# Patient Record
Sex: Female | Born: 1995 | Race: White | Hispanic: No | Marital: Single | Smoking: Former smoker
Health system: Southern US, Community
[De-identification: ages and names within clinical notes are randomized; demographics above are authoritative.]

## PROBLEM LIST (undated history)

## (undated) DIAGNOSIS — I499 Cardiac arrhythmia, unspecified: Secondary | ICD-10-CM

## (undated) DIAGNOSIS — H539 Unspecified visual disturbance: Secondary | ICD-10-CM

## (undated) DIAGNOSIS — N390 Urinary tract infection, site not specified: Secondary | ICD-10-CM

## (undated) DIAGNOSIS — B019 Varicella without complication: Secondary | ICD-10-CM

## (undated) DIAGNOSIS — F419 Anxiety disorder, unspecified: Secondary | ICD-10-CM

## (undated) DIAGNOSIS — F909 Attention-deficit hyperactivity disorder, unspecified type: Secondary | ICD-10-CM

## (undated) DIAGNOSIS — F50029 Anorexia nervosa, binge eating/purging type, unspecified: Secondary | ICD-10-CM

## (undated) DIAGNOSIS — E878 Other disorders of electrolyte and fluid balance, not elsewhere classified: Secondary | ICD-10-CM

## (undated) DIAGNOSIS — F5002 Anorexia nervosa, binge eating/purging type: Secondary | ICD-10-CM

## (undated) DIAGNOSIS — T39011A Poisoning by aspirin, accidental (unintentional), initial encounter: Secondary | ICD-10-CM

## (undated) DIAGNOSIS — F329 Major depressive disorder, single episode, unspecified: Secondary | ICD-10-CM

## (undated) DIAGNOSIS — F509 Eating disorder, unspecified: Secondary | ICD-10-CM

## (undated) DIAGNOSIS — F32A Depression, unspecified: Secondary | ICD-10-CM

## (undated) HISTORY — PX: INDUCED ABORTION: SHX677

## (undated) HISTORY — PX: GUM SURGERY: SHX658

---
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
## 2014-01-11 ENCOUNTER — Encounter: Payer: Self-pay | Admitting: Internal Medicine

## 2014-01-16 ENCOUNTER — Ambulatory Visit: Payer: Self-pay | Admitting: Internal Medicine

## 2014-04-02 ENCOUNTER — Encounter: Payer: Self-pay | Admitting: Internal Medicine

## 2014-04-15 ENCOUNTER — Ambulatory Visit (INDEPENDENT_AMBULATORY_CARE_PROVIDER_SITE_OTHER): Payer: BC Managed Care – PPO | Admitting: Obstetrics

## 2014-04-15 ENCOUNTER — Encounter: Payer: Self-pay | Admitting: Obstetrics

## 2014-04-15 VITALS — BP 110/69 | HR 49 | Temp 98.0°F | Ht 70.0 in | Wt 158.0 lb

## 2014-04-15 DIAGNOSIS — Z3041 Encounter for surveillance of contraceptive pills: Secondary | ICD-10-CM

## 2014-04-15 DIAGNOSIS — Z113 Encounter for screening for infections with a predominantly sexual mode of transmission: Secondary | ICD-10-CM

## 2014-04-15 DIAGNOSIS — Z01419 Encounter for gynecological examination (general) (routine) without abnormal findings: Secondary | ICD-10-CM

## 2014-04-15 DIAGNOSIS — IMO0002 Reserved for concepts with insufficient information to code with codable children: Secondary | ICD-10-CM | POA: Insufficient documentation

## 2014-04-15 NOTE — Progress Notes (Signed)
Subjective:     Danielle Prince is a 18 y.o. female here for a routine exam.  Current complaints:   Painful area at introitus anteriorly.  Small piece of tissue that hurts with intercourse for the past month.  Personal health questionnaire:  Is patient Ashkenazi Jewish, have a family history of breast and/or ovarian cancer: no Is there a family history of uterine cancer diagnosed at age < 6750, gastrointestinal cancer, urinary tract cancer, family member who is a Personnel officerLynch syndrome-associated carrier: no Is the patient overweight and hypertensive, family history of diabetes, personal history of gestational diabetes or PCOS: no Is patient over 2655, have PCOS,  family history of premature CHD under age 19, diabetes, smoke, have hypertension or peripheral artery disease:  no  The HPI was reviewed and explored in further detail by the provider. Gynecologic History Patient's last menstrual period was 04/07/2014. Contraception: OCP (estrogen/progesterone) Last Pap: n/a. Results were: n/a Last mammogram: n/a. Results were: n/a  Obstetric History OB History  Gravida Para Term Preterm AB SAB TAB Ectopic Multiple Living  0 0 0 0 0 0 0 0 0 0         Past Medical History  Diagnosis Date  . Anorexia nervosa with bulimia   . Depression   . Anxiety   . ADHD (attention deficit hyperactivity disorder)   . Eating disorder   . Urinary tract infection   . Varicella   . Vision abnormalities   . Electrolyte disturbance 2010, 2012    hypokalemia, hypochloremia.  . Aspirin overdose 02/2012  . ADHD (attention deficit hyperactivity disorder)   . Dysrhythmia     Past Surgical History  Procedure Laterality Date  . Gum surgery    . Colonoscopy with esophagogastroduodenoscopy (egd) N/A 10/28/2013    Procedure: COLONOSCOPY WITH ESOPHAGOGASTRODUODENOSCOPY (EGD);  Surgeon: Beverley FiedlerJay M Pyrtle, MD;  [facility];  Service: Endoscopy;  Laterality: N/A;    Current outpatient prescriptions:ferrous gluconate  (FERGON) 324 MG tablet, Take 1 tablet (324 mg total) by mouth 2 (two) times daily with a meal., Disp: 60 tablet, Rfl: 5;  Linaclotide (LINZESS) 290 MCG CAPS capsule, Take 1 capsule (290 mcg total) by mouth daily., Disp: 30 capsule, Rfl: 5;  Norgestimate-Ethinyl Estradiol Triphasic (TRINESSA, 28,) 0.18/0.215/0.25 MG-35 MCG tablet, Take 1 tablet by mouth at bedtime., Disp: , Rfl:  pantoprazole (PROTONIX) 40 MG tablet, Take 1 tablet (40 mg total) by mouth 2 (two) times daily before a meal., Disp: 60 tablet, Rfl: 5 No Known Allergies  History  Substance Use Topics  . Smoking status: Former Smoker -- 0.50 packs/day    Types: Cigarettes    Quit date: 06/25/2013  . Smokeless tobacco: Never Used  . Alcohol Use: Yes     Comment: occasioally     Family History  Problem Relation Age of Onset  . Depression Sister   . Depression Paternal Aunt   . Drug abuse Maternal Uncle     deceased  . Drug abuse Paternal Aunt       Review of Systems  Constitutional: negative for fatigue and weight loss Respiratory: negative for cough and wheezing Cardiovascular: negative for chest pain, fatigue and palpitations Gastrointestinal: negative for abdominal pain and change in bowel habits Musculoskeletal:negative for myalgias Neurological: negative for gait problems and tremors Behavioral/Psych: negative for abusive relationship, depression Endocrine: negative for temperature intolerance   Genitourinary:negative for abnormal menstrual periods, genital lesions, hot flashes, sexual problems and vaginal discharge Integument/breast: negative for breast lump, breast tenderness, nipple discharge and skin lesion(s)  Objective:       General:   alert  Skin:   no rash or abnormalities  Lungs:   clear to auscultation bilaterally  Heart:   regular rate and rhythm, S1, S2 normal, no murmur, click, rub or gallop  Breasts:   normal without suspicious masses, skin or nipple changes or axillary nodes  Abdomen:  normal  findings: no organomegaly, soft, non-tender and no hernia  Pelvis:  External genitalia: normal general appearance.  Tender suburethral hymenal remnant. Urinary system: urethral meatus normal and bladder without fullness, nontender Vaginal: normal without tenderness, induration or masses Cervix: normal appearance Adnexa: normal bimanual exam Uterus: anteverted and non-tender, normal size   Lab Review Urine pregnancy test Labs reviewed yes Radiologic studies reviewed no   Assessment:    Healthy female exam.  Not sure of etiology of the introital pain.  There is a small skin tag just below the urethra that is tender, but appears to be a normal hymenal remnant.    Plan:    Education reviewed: safe sex/STD prevention. Contraception: OCP (estrogen/progesterone). Follow up in: 6 weeks.   No orders of the defined types were placed in this encounter.   No orders of the defined types were placed in this encounter.   Need to obtain previous records Possible management options include:  Resection of the small tender vaginal mucosal tissue. Follow up as needed.

## 2014-04-16 LAB — WET PREP BY MOLECULAR PROBE
Candida species: NEGATIVE
GARDNERELLA VAGINALIS: POSITIVE — AB
TRICHOMONAS VAG: NEGATIVE

## 2014-04-16 LAB — GC/CHLAMYDIA PROBE AMP
CT Probe RNA: NEGATIVE
GC Probe RNA: NEGATIVE

## 2014-04-18 ENCOUNTER — Other Ambulatory Visit: Payer: Self-pay | Admitting: *Deleted

## 2014-04-18 DIAGNOSIS — B9689 Other specified bacterial agents as the cause of diseases classified elsewhere: Secondary | ICD-10-CM

## 2014-04-18 DIAGNOSIS — N760 Acute vaginitis: Principal | ICD-10-CM

## 2014-04-18 MED ORDER — METRONIDAZOLE 500 MG PO TABS: 500.0000 mg | ORAL_TABLET | Freq: Two times a day (BID) | ORAL | Status: DC

## 2014-05-30 ENCOUNTER — Ambulatory Visit: Payer: BC Managed Care – PPO | Admitting: Obstetrics

## 2014-06-13 ENCOUNTER — Encounter: Payer: Self-pay | Admitting: Obstetrics

## 2014-06-13 ENCOUNTER — Ambulatory Visit (INDEPENDENT_AMBULATORY_CARE_PROVIDER_SITE_OTHER): Payer: BC Managed Care – PPO | Admitting: Obstetrics

## 2014-06-13 VITALS — BP 104/69 | HR 62 | Temp 98.6°F | Ht 70.0 in | Wt 160.0 lb

## 2014-06-13 DIAGNOSIS — A499 Bacterial infection, unspecified: Secondary | ICD-10-CM

## 2014-06-13 DIAGNOSIS — N949 Unspecified condition associated with female genital organs and menstrual cycle: Secondary | ICD-10-CM

## 2014-06-13 DIAGNOSIS — B9689 Other specified bacterial agents as the cause of diseases classified elsewhere: Secondary | ICD-10-CM

## 2014-06-13 DIAGNOSIS — N760 Acute vaginitis: Secondary | ICD-10-CM

## 2014-06-13 MED ORDER — TINIDAZOLE 500 MG PO TABS: 1000.0000 mg | ORAL_TABLET | Freq: Every day | ORAL | Status: DC

## 2014-06-14 LAB — HEPATITIS B SURFACE ANTIGEN: Hepatitis B Surface Ag: NEGATIVE

## 2014-06-14 LAB — WET PREP BY MOLECULAR PROBE
CANDIDA SPECIES: NEGATIVE
Gardnerella vaginalis: POSITIVE — AB
TRICHOMONAS VAG: NEGATIVE

## 2014-06-14 LAB — RPR

## 2014-06-14 LAB — HEPATITIS C ANTIBODY: HCV Ab: NEGATIVE

## 2014-06-14 LAB — HIV ANTIBODY (ROUTINE TESTING W REFLEX): HIV 1&2 Ab, 4th Generation: NONREACTIVE

## 2014-06-14 LAB — GC/CHLAMYDIA PROBE AMP
CT Probe RNA: NEGATIVE
GC PROBE AMP APTIMA: NEGATIVE

## 2014-06-14 NOTE — Progress Notes (Signed)
Patient ID: Danielle ManilaMcKenzie Prince, female   DOB: Dec 03, 1996, 18 y.o.   MRN: [id number]  Chief Complaint  Patient presents with  . Follow-up    Pain/ STD Screen.     HPI Danielle Prince is a 18 y.o. female.  Pain with intercourse and malodorous vaginal discharge.  HPI  Past Medical History  Diagnosis Date  . Anorexia nervosa with bulimia   . Depression   . Anxiety   . ADHD (attention deficit hyperactivity disorder)   . Eating disorder   . Urinary tract infection   . Varicella   . Vision abnormalities   . Electrolyte disturbance 2010, 2012    hypokalemia, hypochloremia.  . Aspirin overdose 02/2012  . ADHD (attention deficit hyperactivity disorder)   . Dysrhythmia     Past Surgical History  Procedure Laterality Date  . Gum surgery    . Colonoscopy with esophagogastroduodenoscopy (egd) N/A 10/28/2013    Procedure: COLONOSCOPY WITH ESOPHAGOGASTRODUODENOSCOPY (EGD);  Surgeon: Beverley FiedlerJay M Pyrtle, MD;  Location[facility] END[facility];  Service: Endoscopy;  Laterality: N/A;    Family History  Problem Relation Age of Onset  . Depression Sister   . Depression Paternal Aunt   . Drug abuse Maternal Uncle     deceased  . Drug abuse Paternal Aunt     Social History History  Substance Use Topics  . Smoking status: Former Smoker -- 0.50 packs/day    Types: Cigarettes    Quit date: 06/25/2013  . Smokeless tobacco: Never Used  . Alcohol Use: Yes     Comment: occasioally     No Known Allergies  Current Outpatient Prescriptions  Medication Sig Dispense Refill  . ferrous gluconate (FERGON) 324 MG tablet Take 1 tablet (324 mg total) by mouth 2 (two) times daily with a meal.  60 tablet  5  . lamoTRIgine (LAMICTAL) 100 MG tablet Take 100 mg by mouth daily.      . Linaclotide (LINZESS) 290 MCG CAPS capsule Take 1 capsule (290 mcg total) by mouth daily.  30 capsule  5  . Lurasidone HCl (LATUDA) 20 MG TABS Take by mouth.      . Norgestimate-Ethinyl Estradiol Triphasic (TRINESSA, 28,) 0.18/0.215/0.25  MG-35 MCG tablet Take 1 tablet by mouth at bedtime.      . pantoprazole (PROTONIX) 40 MG tablet Take 1 tablet (40 mg total) by mouth 2 (two) times daily before a meal.  60 tablet  5  . tinidazole (TINDAMAX) 500 MG tablet Take 2 tablets (1,000 mg total) by mouth daily with breakfast.  10 tablet  2   No current facility-administered medications for this visit.    Review of Systems Review of Systems Constitutional: negative for fatigue and weight loss Respiratory: negative for cough and wheezing Cardiovascular: negative for chest pain, fatigue and palpitations Gastrointestinal: negative for abdominal pain and change in bowel habits Genitourinary:negative Integument/breast: negative for nipple discharge Musculoskeletal:negative for myalgias Neurological: negative for gait problems and tremors Behavioral/Psych: negative for abusive relationship, depression Endocrine: negative for temperature intolerance     Blood pressure 104/69, pulse 62, temperature 98.6 F (37 C), height 5\' 10"  (1.778 m), weight 160 lb (72.576 kg), last menstrual period 05/27/2014.  Physical Exam Physical Exam General:   alert  Skin:   no rash or abnormalities  Lungs:   clear to auscultation bilaterally  Heart:   regular rate and rhythm, S1, S2 normal, no murmur, click, rub or gallop  Breasts:   normal without suspicious masses, skin or nipple changes or axillary nodes  Abdomen:  normal findings: no organomegaly, soft, non-tender and no hernia  Pelvis:  External genitalia: normal general appearance Urinary system: urethral meatus normal and bladder without fullness, nontender Vaginal: normal without tenderness, induration or masses Cervix: normal appearance Adnexa: normal bimanual exam Uterus: anteverted and non-tender, normal size      Data Reviewed Labs  Assessment    Pelvic pain, particularly with intercourse. BV     Plan   Ibuprofen prn. Tindamax Rx.    Orders Placed This Encounter   Procedures  . WET PREP BY MOLECULAR PROBE  . GC/Chlamydia Probe Amp  . HIV antibody  . Hepatitis B surface antigen  . RPR  . Hepatitis C antibody   Meds ordered this encounter  Medications  . Lurasidone HCl (LATUDA) 20 MG TABS    Sig: Take by mouth.  . lamoTRIgine (LAMICTAL) 100 MG tablet    Sig: Take 100 mg by mouth daily.  Marland Kitchen. tinidazole (TINDAMAX) 500 MG tablet    Sig: Take 2 tablets (1,000 mg total) by mouth daily with breakfast.    Dispense:  10 tablet    Refill:  2      HARPER,CHARLES A 06/14/2014, 4:26 AM

## 2014-06-18 ENCOUNTER — Telehealth: Payer: Self-pay | Admitting: *Deleted

## 2014-06-18 NOTE — Telephone Encounter (Signed)
Patient called regarding lab results. Patient notified of lab results. Patient was treated at time of visit by Dr. Clearance CootsHarper. Patient notified that she is being treated appropriately.

## 2014-07-04 ENCOUNTER — Encounter: Payer: Self-pay | Admitting: Internal Medicine

## 2014-07-14 ENCOUNTER — Other Ambulatory Visit: Payer: Self-pay | Admitting: Obstetrics

## 2014-07-24 ENCOUNTER — Ambulatory Visit (INDEPENDENT_AMBULATORY_CARE_PROVIDER_SITE_OTHER): Payer: BC Managed Care – PPO | Admitting: Obstetrics

## 2014-07-24 ENCOUNTER — Encounter: Payer: Self-pay | Admitting: Obstetrics

## 2014-07-24 VITALS — BP 128/71 | HR 71 | Temp 98.2°F | Ht 70.0 in | Wt 162.0 lb

## 2014-07-24 DIAGNOSIS — A499 Bacterial infection, unspecified: Secondary | ICD-10-CM

## 2014-07-24 DIAGNOSIS — N760 Acute vaginitis: Secondary | ICD-10-CM

## 2014-07-24 DIAGNOSIS — B9689 Other specified bacterial agents as the cause of diseases classified elsewhere: Secondary | ICD-10-CM

## 2014-07-24 DIAGNOSIS — R21 Rash and other nonspecific skin eruption: Secondary | ICD-10-CM

## 2014-07-24 MED ORDER — METRONIDAZOLE 500 MG PO TABS: 500.0000 mg | ORAL_TABLET | Freq: Two times a day (BID) | ORAL | Status: DC

## 2014-07-24 MED ORDER — HYDROCORTISONE 1 % EX CREA: 1.0000 "application " | TOPICAL_CREAM | Freq: Two times a day (BID) | CUTANEOUS | Status: DC

## 2014-07-25 ENCOUNTER — Encounter: Payer: Self-pay | Admitting: Obstetrics

## 2014-07-25 ENCOUNTER — Encounter: Payer: Self-pay | Admitting: Internal Medicine

## 2014-07-25 NOTE — Progress Notes (Signed)
Patient ID: Danielle Prince, female   DOB: Jun 04, 1996, 18 y.o.   MRN: [id number]  Chief Complaint  Patient presents with  . Vaginal rash    HPI Danielle Prince is a 18 y.o. female.  C/O rash on breast and labia.  HPI  Past Medical History  Diagnosis Date  . Anorexia nervosa with bulimia   . Depression   . Anxiety   . ADHD (attention deficit hyperactivity disorder)   . Eating disorder   . Urinary tract infection   . Varicella   . Vision abnormalities   . Electrolyte disturbance 2010, 2012    hypokalemia, hypochloremia.  . Aspirin overdose 02/2012  . ADHD (attention deficit hyperactivity disorder)   . Dysrhythmia     Past Surgical History  Procedure Laterality Date  . Gum surgery    . Colonoscopy with esophagogastroduodenoscopy (egd) N/A 10/28/2013    Procedure: COLONOSCOPY WITH ESOPHAGOGASTRODUODENOSCOPY (EGD);  Surgeon: Beverley Fiedler, MD;  Location: [facility] ENDOSCOPY;  Service: Endoscopy;  Laterality: N/A;    Family History  Problem Relation Age of Onset  . Depression Sister   . Depression Paternal Aunt   . Drug abuse Maternal Uncle     deceased  . Drug abuse Paternal Aunt   . Cancer Father     Social History History  Substance Use Topics  . Smoking status: Former Smoker -- 0.50 packs/day    Types: Cigarettes    Quit date: 06/25/2013  . Smokeless tobacco: Never Used  . Alcohol Use: Yes     Comment: occasioally     No Known Allergies  Current Outpatient Prescriptions  Medication Sig Dispense Refill  . busPIRone (BUSPAR) 10 MG tablet Take 10 mg by mouth 3 (three) times daily.      . ferrous gluconate (FERGON) 324 MG tablet Take 1 tablet (324 mg total) by mouth 2 (two) times daily with a meal.  60 tablet  5  . lamoTRIgine (LAMICTAL) 100 MG tablet Take 100 mg by mouth daily.      . Linaclotide (LINZESS) 290 MCG CAPS capsule Take 1 capsule (290 mcg total) by mouth daily.  30 capsule  5  . Norgestimate-Ethinyl Estradiol Triphasic (TRINESSA, 28,) 0.18/0.215/0.25  MG-35 MCG tablet Take 1 tablet by mouth at bedtime.      . hydrocortisone cream 1 % Apply 1 application topically 2 (two) times daily.  30 g  0  . metroNIDAZOLE (FLAGYL) 500 MG tablet Take 1 tablet (500 mg total) by mouth 2 (two) times daily.  14 tablet  2   No current facility-administered medications for this visit.    Review of Systems Review of Systems Constitutional: negative for fatigue and weight loss Respiratory: negative for cough and wheezing Cardiovascular: negative for chest pain, fatigue and palpitations Gastrointestinal: negative for abdominal pain and change in bowel habits Genitourinary:negative Integument/breast: negative for nipple discharge Musculoskeletal:negative for myalgias Neurological: negative for gait problems and tremors Behavioral/Psych: negative for abusive relationship, depression Endocrine: negative for temperature intolerance     Blood pressure 128/71, pulse 71, temperature 98.2 F (36.8 C), height 5\' 10"  (1.778 m), weight 162 lb (73.483 kg), last menstrual period 07/15/2014.  Physical Exam Physical Exam General:   alert  Skin:   no rash or abnormalities  Lungs:   clear to auscultation bilaterally  Heart:   regular rate and rhythm, S1, S2 normal, no murmur, click, rub or gallop  Breasts:   normal without suspicious masses, skin or nipple changes or axillary nodes  Abdomen:  normal findings: no  organomegaly, soft, non-tender and no hernia  Pelvis:  External genitalia: normal general appearance Urinary system: urethral meatus normal and bladder without fullness, nontender Vaginal: normal without tenderness, induration or masses Cervix: normal appearance Adnexa: normal bimanual exam Uterus: anteverted and non-tender, normal size      Data Reviewed Labs  Assessment    Healthy female exam.  Patient reassured.    Plan    No orders of the defined types were placed in this encounter.   Meds ordered this encounter  Medications  . busPIRone  (BUSPAR) 10 MG tablet    Sig: Take 10 mg by mouth 3 (three) times daily.  . metroNIDAZOLE (FLAGYL) 500 MG tablet    Sig: Take 1 tablet (500 mg total) by mouth 2 (two) times daily.    Dispense:  14 tablet    Refill:  2  . hydrocortisone cream 1 %    Sig: Apply 1 application topically 2 (two) times daily.    Dispense:  30 g    Refill:  0      HARPER,CHARLES A 07/25/2014, 6:06 AM

## 2014-08-19 ENCOUNTER — Other Ambulatory Visit: Payer: Self-pay | Admitting: Obstetrics

## 2014-08-19 ENCOUNTER — Other Ambulatory Visit: Payer: Self-pay | Admitting: *Deleted

## 2014-08-19 DIAGNOSIS — Z3041 Encounter for surveillance of contraceptive pills: Secondary | ICD-10-CM

## 2014-08-19 MED ORDER — NORGESTIM-ETH ESTRAD TRIPHASIC 0.18/0.215/0.25 MG-35 MCG PO TABS: 1.0000 | ORAL_TABLET | Freq: Every day | ORAL | Status: DC

## 2014-09-04 ENCOUNTER — Ambulatory Visit: Payer: BC Managed Care – PPO | Admitting: Obstetrics

## 2014-10-01 ENCOUNTER — Encounter: Payer: Self-pay | Admitting: Internal Medicine

## 2014-10-01 ENCOUNTER — Telehealth: Payer: Self-pay | Admitting: *Deleted

## 2014-10-01 ENCOUNTER — Other Ambulatory Visit (INDEPENDENT_AMBULATORY_CARE_PROVIDER_SITE_OTHER): Payer: BC Managed Care – PPO

## 2014-10-01 ENCOUNTER — Ambulatory Visit (INDEPENDENT_AMBULATORY_CARE_PROVIDER_SITE_OTHER): Payer: BC Managed Care – PPO | Admitting: Internal Medicine

## 2014-10-01 VITALS — BP 108/60 | HR 72 | Ht 70.0 in | Wt 173.2 lb

## 2014-10-01 DIAGNOSIS — K209 Esophagitis, unspecified without bleeding: Secondary | ICD-10-CM

## 2014-10-01 DIAGNOSIS — D509 Iron deficiency anemia, unspecified: Secondary | ICD-10-CM

## 2014-10-01 DIAGNOSIS — K5900 Constipation, unspecified: Secondary | ICD-10-CM

## 2014-10-01 DIAGNOSIS — F502 Bulimia nervosa: Secondary | ICD-10-CM

## 2014-10-01 DIAGNOSIS — K648 Other hemorrhoids: Secondary | ICD-10-CM

## 2014-10-01 LAB — CBC WITH DIFFERENTIAL/PLATELET
BASOS ABS: 0.1 10*3/uL (ref 0.0–0.1)
BASOS PCT: 0.6 % (ref 0.0–3.0)
Eosinophils Absolute: 0.2 10*3/uL (ref 0.0–0.7)
Eosinophils Relative: 1.8 % (ref 0.0–5.0)
HEMATOCRIT: 40.5 % (ref 36.0–49.0)
HEMOGLOBIN: 13.1 g/dL (ref 12.0–16.0)
LYMPHS ABS: 1.9 10*3/uL (ref 0.7–4.0)
LYMPHS PCT: 20.1 % — AB (ref 24.0–48.0)
MCHC: 32.5 g/dL (ref 31.0–37.0)
MCV: 94.2 fl (ref 78.0–98.0)
MONOS PCT: 5.6 % (ref 3.0–12.0)
Monocytes Absolute: 0.5 10*3/uL (ref 0.1–1.0)
Neutro Abs: 6.8 10*3/uL (ref 1.4–7.7)
Neutrophils Relative %: 71.9 % — ABNORMAL HIGH (ref 43.0–71.0)
Platelets: 288 10*3/uL (ref 150.0–575.0)
RBC: 4.3 Mil/uL (ref 3.80–5.70)
RDW: 13.2 % (ref 11.4–15.5)
WBC: 9.5 10*3/uL (ref 4.5–13.5)

## 2014-10-01 LAB — IBC PANEL
Iron: 154 ug/dL — ABNORMAL HIGH (ref 42–145)
SATURATION RATIOS: 33.2 % (ref 20.0–50.0)
Transferrin: 331.1 mg/dL (ref 212.0–360.0)

## 2014-10-01 LAB — FERRITIN: FERRITIN: 16.8 ng/mL (ref 10.0–291.0)

## 2014-10-01 MED ORDER — HYDROCORTISONE ACETATE 25 MG RE SUPP: 25.0000 mg | Freq: Two times a day (BID) | RECTAL | Status: DC

## 2014-10-01 MED ORDER — LINACLOTIDE 145 MCG PO CAPS: 145.0000 ug | ORAL_CAPSULE | Freq: Every day | ORAL | Status: DC

## 2014-10-01 NOTE — Patient Instructions (Signed)
You have been scheduled for an endoscopy. Please follow written instructions given to you at your visit today. If you use inhalers (even only as needed), please bring them with you on the day of your procedure. Your physician has requested that you go to www.startemmi.com and enter the access code given to you at your visit today. This web site gives a general overview about your procedure. However, you should still follow specific instructions given to you by our office regarding your preparation for the procedure.  Go to the basement for labs today We have sent in your prescriptions to your pharmacy (Anusol Suppositories and Linzess) We will contact Dr Levie HeritageGuttermans office for a referral and contact you with that appointment

## 2014-10-01 NOTE — Addendum Note (Signed)
Addended by: Marlowe KaysSTALLINGS, Benett Swoyer M on: 10/01/2014 01:26 PM   Modules accepted: Orders

## 2014-10-01 NOTE — Progress Notes (Signed)
Patient ID: Danielle Prince, female   DOB: 06-02-1996, 18 y.o.   MRN: [id number] HPI: Danielle ManilaMcKenzie Favero is a 18 year old female known to me for evaluation hospital with iron deficiency anemia with rectal bleeding who is seen in followup. This is her first time seeing me in the office after seeing her in consultation in November 2014. At that time she had an upper endoscopy and colonoscopy performed to evaluate rectal bleeding and iron deficiency anemia. Upper endoscopy was notable for severe esophagitis felt secondary to reflux and also vomiting in the setting of her bulimia. Small bowel biopsies were unremarkable. Colonoscopic evaluation to the terminal ileum was normal, internal hemorrhoids were seen. Repeat upper endoscopy was recommended in 8-12 weeks but she failed to followup until today. Today her biggest complaint is constipation. This was previously an issue and she was using Linzess 290 mcg. When he is daily this gives her diarrhea and so she has been using it as needed and in actuality rarely. She notes constipation which is ongoing having about one hard and dry bowel movement weekly. She has noted intermittent red blood with wiping associated with bowel movement. She does report overall improvement in heartburn and denies dysphagia or odynophagia. She has prescription for pantoprazole but is not taking it regularly. She notes her bulimia overall is better and she is vomiting less. She reports having episodes where she will induce vomiting multiple times per day for 2-3 days and then not for weeks to months. Overall bulimia is better but she is not seeing anyone at mental health right now. She denies fevers or chills. She has noted some easy bruising. She is no longer taking iron supplementation.  Past Medical History  Diagnosis Date  . Anorexia nervosa with bulimia   . Depression   . Anxiety   . ADHD (attention deficit hyperactivity disorder)   . Eating disorder   . Urinary tract infection    . Varicella   . Vision abnormalities   . Electrolyte disturbance 2010, 2012    hypokalemia, hypochloremia.  . Aspirin overdose 02/2012  . ADHD (attention deficit hyperactivity disorder)   . Dysrhythmia     Past Surgical History  Procedure Laterality Date  . Gum surgery    . Colonoscopy with esophagogastroduodenoscopy (egd) N/A 10/28/2013    Procedure: COLONOSCOPY WITH ESOPHAGOGASTRODUODENOSCOPY (EGD);  Surgeon: Beverley FiedlerJay M Gianna Calef, MD;  [facility];  Service: Endoscopy;  Laterality: N/A;    Outpatient Prescriptions Prior to Visit  Medication Sig Dispense Refill  . lamoTRIgine (LAMICTAL) 100 MG tablet Take 100 mg by mouth daily.      . Norgestimate-Ethinyl Estradiol Triphasic (TRINESSA, 28,) 0.18/0.215/0.25 MG-35 MCG tablet Take 1 tablet by mouth at bedtime.  1 Package  11  . busPIRone (BUSPAR) 10 MG tablet Take 10 mg by mouth 3 (three) times daily.      . ferrous gluconate (FERGON) 324 MG tablet Take 1 tablet (324 mg total) by mouth 2 (two) times daily with a meal.  60 tablet  5  . hydrocortisone cream 1 % Apply 1 application topically 2 (two) times daily.  30 g  0  . Linaclotide (LINZESS) 290 MCG CAPS capsule Take 1 capsule (290 mcg total) by mouth daily.  30 capsule  5  . metroNIDAZOLE (FLAGYL) 500 MG tablet Take 1 tablet (500 mg total) by mouth 2 (two) times daily.  14 tablet  2  . TRINESSA, 28, 0.18/0.215/0.25 MG-35 MCG tablet TAKE 1 TABLET BY MOUTH EVERY DAY  28 tablet  0  No facility-administered medications prior to visit.    No Known Allergies  Family History  Problem Relation Age of Onset  . Depression Sister   . Depression Paternal Aunt   . Drug abuse Maternal Uncle     deceased  . Drug abuse Paternal Aunt   . Cancer Father     History  Substance Use Topics  . Smoking status: Former Smoker -- 0.50 packs/day    Types: Cigarettes    Quit date: 06/25/2013  . Smokeless tobacco: Never Used  . Alcohol Use: Yes     Comment: occasioally     ROS: As per  history of present illness, otherwise negative  BP 108/60  Pulse 72  Ht 5\' 10"  (1.778 m)  Wt 173 lb 3.2 oz (78.563 kg)  BMI 24.85 kg/m2  LMP 09/15/2014 Constitutional: Well-developed and well-nourished. No distress. HEENT: Normocephalic and atraumatic. Oropharynx is clear and moist. No oropharyngeal exudate. Conjunctivae are normal.  No scleral icterus. Neck: Neck supple. Trachea midline. Cardiovascular: Normal rate, regular rhythm and intact distal pulses. No M/R/G Pulmonary/chest: Effort normal and breath sounds normal. No wheezing, rales or rhonchi. Abdominal: Soft, nontender, nondistended. Bowel sounds active throughout. There are no masses palpable. No hepatosplenomegaly. Neurological: Alert and oriented to person place and time. Skin: Skin is warm and dry. No rashes noted. Psychiatric: Normal mood and affect. Behavior is normal.  RELEVANT LABS AND IMAGING: CBC    Component Value Date/Time   WBC 13.0* 10/28/2013 0544   RBC 4.64 10/28/2013 0544   HGB 8.8* 10/28/2013 0544   HCT 29.8* 10/28/2013 0544   PLT 507* 10/28/2013 0544   MCV 64.2* 10/28/2013 0544   MCH 19.0* 10/28/2013 0544   MCHC 29.5* 10/28/2013 0544   RDW 21.9* 10/28/2013 0544   LYMPHSABS 2.2 05/28/2012 1649   MONOABS 0.3 05/28/2012 1649   EOSABS 0.0 05/28/2012 1649   BASOSABS 0.0 05/28/2012 1649    CMP     Component Value Date/Time   NA 139 10/28/2013 0544   K 3.4* 10/28/2013 0544   CL 105 10/28/2013 0544   CO2 22 10/28/2013 0544   GLUCOSE 131* 10/28/2013 0544   BUN 10 10/28/2013 0544   CREATININE 0.71 10/28/2013 0544   CALCIUM 9.4 10/28/2013 0544   PROT 6.9 10/26/2013 1705   ALBUMIN 3.3* 10/26/2013 1705   AST 17 10/26/2013 1705   ALT 10 10/26/2013 1705   ALKPHOS 39 10/26/2013 1705   BILITOT 0.2* 10/26/2013 1705   GFRNONAA >90 10/28/2013 0544   GFRAA >90 10/28/2013 0544   Labs today to include CBC and iron studies  We reviewed the upper endoscopy and colonoscopy together  ASSESSMENT/PLAN: [age] year old female known to me for  evaluation hospital with iron deficiency anemia with rectal bleeding who is seen in followup.   1. Esophagitis -- secondary to bulimia and vomiting. No evidence for dysphagia. I have recommended repeat upper endoscopy to document healing and exclude underlying Barrett's esophagus. We discussed PPI, and I will feel more comfortable recommending at long-term based on the findings upcoming endoscopy. If heartburn is more of an issue daily, I instructed her to resume pantoprazole 40 mg daily.  2. Constipation -- she is having diarrhea with Linzess 290 mcg daily. I asked that she decrease to 145 mcg daily. I instructed her to use this on a daily basis to provide ultimate benefit. If this causes diarrhea my next option would be a trial of lower dose lubiprostone. Also encouraged her to drink 6-8 glasses of water or fluid daily.  3. Iron deficiency anemia -- celiac negative, likely some element of chronic blood loss with esophagitis and internal hemorrhoids. Repeat CBC and iron studies today  4. Internal hemorrhoids -- hydrocortisone suppositories 25 mg twice daily for 5 days, should improve more with improving constipation  5. Bulimia -- persistent though perhaps better subjectively. I encouraged her to establish with mental health provider and she will be referred to [facility] and she is agreeable and appreciative.

## 2014-10-01 NOTE — Telephone Encounter (Signed)
Patient scheduled to see Salomon Fickerri Bauert at  [facility] [place]  [address]  [place] [address]  10/07/2014 at [employer]1pm  Called patient and l/m

## 2014-10-07 ENCOUNTER — Ambulatory Visit: Payer: Self-pay | Admitting: Psychology

## 2014-10-09 ENCOUNTER — Ambulatory Visit (AMBULATORY_SURGERY_CENTER): Payer: BC Managed Care – PPO | Admitting: Internal Medicine

## 2014-10-09 ENCOUNTER — Encounter: Payer: Self-pay | Admitting: Internal Medicine

## 2014-10-09 VITALS — BP 106/70 | HR 55 | Temp 98.4°F | Resp 24 | Ht 70.0 in | Wt 173.0 lb

## 2014-10-09 DIAGNOSIS — K209 Esophagitis, unspecified without bleeding: Secondary | ICD-10-CM

## 2014-10-09 DIAGNOSIS — F502 Bulimia nervosa: Secondary | ICD-10-CM

## 2014-10-09 MED ORDER — SODIUM CHLORIDE 0.9 % IV SOLN: 500.0000 mL | INTRAVENOUS | Status: DC

## 2014-10-09 NOTE — Op Note (Addendum)
[facility] [address] [place], [id number]   ENDOSCOPY PROCEDURE REPORT  PATIENT: Galen ManilaDavidson, Danielle  MR#: [id number] BIRTHDATE: February 08, 1996 , 18  yrs. old GENDER: female ENDOSCOPIST: Beverley FiedlerJay M Talena Neira, MD PROCEDURE DATE:  10/09/2014 PROCEDURE:  EGD w/ biopsy ASA CLASS:     Class II INDICATIONS:  follow up of reflux esophagitis. MEDICATIONS: Propofol 200 mg IV, lidocaine 40 mg IV, and Monitored anesthesia care TOPICAL ANESTHETIC: none  DESCRIPTION OF PROCEDURE: After the risks benefits and alternatives of the procedure were thoroughly explained, informed consent was obtained.  The LB [id number] endoscope was introduced through the mouth and advanced to the second portion of the duodenum , Without limitations.  The instrument was slowly withdrawn as the mucosa was fully examined.   ESOPHAGUS: There was LA Class B esophagitis (One or more mucosal breaks > 5mm, but without continuity across mucosal folds) noted. This is felt secondary to acid reflux/vomiting.  The Z line above the top of the gastric folds was irregular, multiple biopsies to exclude Barrett's esophagus  STOMACH: A 3-4 cm hiatal hernia was noted.   The mucosa of the stomach appeared normal.  DUODENUM: The duodenal mucosa showed no abnormalities in the bulb and 2nd part of the duodenum.  Retroflexed views revealed a hiatal hernia.     The scope was then withdrawn from the patient and the procedure completed.  COMPLICATIONS: There were no immediate complications.  ENDOSCOPIC IMPRESSION: 1.   There was LA Class B esophagitis noted; irregular Z line; multiple biopsies 2.   3 cm hiatal hernia 3.   The mucosa of the stomach appeared normal 4.   The duodenal mucosa showed no abnormalities in the bulb and 2nd part of the duodenum  RECOMMENDATIONS: 1.  Increase pantoprazole to 40 mg twice daily for 8 weeks 2.  [facility] appointment as recommended 3.  Office follow-up with me in  8-12 weeks   eSigned:  Beverley FiedlerJay M Cheryl Stabenow, MD 10/09/2014 1:52 PM Revised: 10/09/2014 1:52 PMCC:The Patient; PCP

## 2014-10-09 NOTE — Progress Notes (Signed)
Pt stable to RR 

## 2014-10-09 NOTE — Progress Notes (Signed)
Called to room to assist during endoscopic procedure.  Patient ID and intended procedure confirmed with present staff. Received instructions for my participation in the procedure from the performing physician.  

## 2014-10-09 NOTE — Patient Instructions (Signed)
YOU HAD AN ENDOSCOPIC PROCEDURE TODAY AT THE [facility]: Refer to the procedure report that was given to you for any specific questions about what was found during the examination.  If the procedure report does not answer your questions, please call your gastroenterologist to clarify.  If you requested that your care partner not be given the details of your procedure findings, then the procedure report has been included in a sealed envelope for you to review at your convenience later.  YOU SHOULD EXPECT: Some feelings of bloating in the abdomen. Passage of more gas than usual.  Walking can help get rid of the air that was put into your GI tract during the procedure and reduce the bloating. If you had a lower endoscopy (such as a colonoscopy or flexible sigmoidoscopy) you may notice spotting of blood in your stool or on the toilet paper. If you underwent a bowel prep for your procedure, then you may not have a normal bowel movement for a few days.  DIET: Your first meal following the procedure should be a light meal and then it is ok to progress to your normal diet.  A half-sandwich or bowl of soup is an example of a good first meal.  Heavy or fried foods are harder to digest and may make you feel nauseous or bloated.  Likewise meals heavy in dairy and vegetables can cause extra gas to form and this can also increase the bloating.  Drink plenty of fluids but you should avoid alcoholic beverages for 24 hours.  ACTIVITY: Your care partner should take you home directly after the procedure.  You should plan to take it easy, moving slowly for the rest of the day.  You can resume normal activity the day after the procedure however you should NOT DRIVE or use heavy machinery for 24 hours (because of the sedation medicines used during the test).    SYMPTOMS TO REPORT IMMEDIATELY: A gastroenterologist can be reached at any hour.  During normal business hours, 8:30 AM to 5:00 PM Monday through Friday,  call [phone].  After hours and on weekends, please call the GI answering service at [phone] who will take a message and have the physician on call contact you.   Following upper endoscopy (EGD)  Vomiting of blood or coffee ground material  New chest pain or pain under the shoulder blades  Painful or persistently difficult swallowing  New shortness of breath  Fever of 100F or higher  Black, tarry-looking stools  FOLLOW UP: If any biopsies were taken you will be contacted by phone or by letter within the next 1-3 weeks.  Call your gastroenterologist if you have not heard about the biopsies in 3 weeks.  Our staff will call the home number listed on your records the next business day following your procedure to check on you and address any questions or concerns that you may have at that time regarding the information given to you following your procedure. This is a courtesy call and so if there is no answer at the home number and we have not heard from you through the emergency physician on call, we will assume that you have returned to your regular daily activities without incident.  SIGNATURES/CONFIDENTIALITY: You and/or your care partner have signed paperwork which will be entered into your electronic medical record.  These signatures attest to the fact that that the information  above on your After Visit Summary has been reviewed and is understood.  Full  responsibility of the confidentiality of this discharge information lies with you and/or your care-partner.  Esophagitis and hiatal hernia information given.  Increase pantoprazole t0 40mg  twice daily for 8 weeks.  Please call to reschedule appointment with [facility] at [phone].  Follow up with Dr. Rhea BeltonPyrtle in 8-12 weeks, call to make appointment.

## 2014-10-10 ENCOUNTER — Telehealth: Payer: Self-pay

## 2014-10-10 NOTE — Telephone Encounter (Signed)
Left a message at [phone] for the pt to call if any questions or concerns. maw

## 2014-10-16 ENCOUNTER — Encounter: Payer: Self-pay | Admitting: Internal Medicine

## 2014-10-21 ENCOUNTER — Telehealth: Payer: Self-pay | Admitting: Internal Medicine

## 2014-10-21 NOTE — Telephone Encounter (Signed)
We recently increased the dose of Linzess due to nonresponse to the lower dose Diarrhea as a result of this medication should stop after a few days. Discontinue altogether If constipation returns, try MiraLAX 17 g daily If diarrhea still present by Thursday of this week she should notify us

## 2014-10-21 NOTE — Telephone Encounter (Signed)
Attempted to call pt back and was unable to reach pt or leave her a message.

## 2014-10-21 NOTE — Telephone Encounter (Signed)
Pt states the linzess she has been taking is not working for her, states it is causing her to have diarrhea. Pt states she was taking a higher dose and is now taking the lower dose but still having diarrhea. Please advise.

## 2014-10-22 NOTE — Telephone Encounter (Signed)
Pt aware. States she may try taking the linzess every other day instead of every day.

## 2014-12-10 ENCOUNTER — Ambulatory Visit: Payer: BC Managed Care – PPO | Admitting: Internal Medicine

## 2015-01-16 ENCOUNTER — Telehealth: Payer: Self-pay | Admitting: *Deleted

## 2015-01-16 NOTE — Telephone Encounter (Signed)
Patient called requesting an appointment. 5:42 Patient states she is having UTI symptoms. Patient found lump in vagina- wants it checked. Appointment made.

## 2015-01-17 ENCOUNTER — Encounter: Payer: Self-pay | Admitting: Obstetrics

## 2015-01-17 ENCOUNTER — Ambulatory Visit (INDEPENDENT_AMBULATORY_CARE_PROVIDER_SITE_OTHER): Payer: Self-pay | Admitting: Obstetrics

## 2015-01-17 VITALS — BP 116/71 | HR 73 | Temp 98.1°F | Wt 172.0 lb

## 2015-01-17 DIAGNOSIS — R399 Unspecified symptoms and signs involving the genitourinary system: Secondary | ICD-10-CM

## 2015-01-17 DIAGNOSIS — N898 Other specified noninflammatory disorders of vagina: Secondary | ICD-10-CM

## 2015-01-17 LAB — POCT URINALYSIS DIPSTICK
BILIRUBIN UA: NEGATIVE
GLUCOSE UA: NEGATIVE
KETONES UA: NEGATIVE
Leukocytes, UA: NEGATIVE
NITRITE UA: NEGATIVE
Protein, UA: NEGATIVE
Spec Grav, UA: 1.01
Urobilinogen, UA: NEGATIVE
pH, UA: 6.5

## 2015-01-17 MED ORDER — FLUCONAZOLE 150 MG PO TABS: 150.0000 mg | ORAL_TABLET | Freq: Once | ORAL | Status: DC

## 2015-01-17 NOTE — Progress Notes (Signed)
Patient ID: Danielle Prince, female   DOB: [birth year], 19 y.o.   MRN: [id number]  No chief complaint on file.   HPI Danielle Prince is a 19 y.o. female.  Thinks that she's got a yeast infection from treatment of UTI.  Vaginal discharge and itching. HPI  Past Medical History  Diagnosis Date  . Anorexia nervosa with bulimia   . Depression   . Anxiety   . ADHD (attention deficit hyperactivity disorder)   . Eating disorder   . Urinary tract infection   . Varicella   . Vision abnormalities   . Electrolyte disturbance 2010, 2012    hypokalemia, hypochloremia.  . Aspirin overdose 02/2012  . ADHD (attention deficit hyperactivity disorder)   . Dysrhythmia     Past Surgical History  Procedure Laterality Date  . Gum surgery    . Colonoscopy with esophagogastroduodenoscopy (egd) N/A 10/28/2013    Procedure: COLONOSCOPY WITH ESOPHAGOGASTRODUODENOSCOPY (EGD);  Surgeon: Beverley Fiedler, MD;  Location: [facility] ENDOSCOPY;  Service: Endoscopy;  Laterality: N/A;    Family History  Problem Relation Age of Onset  . Depression Sister   . Depression Paternal Aunt   . Drug abuse Maternal Uncle     deceased  . Drug abuse Paternal Aunt   . Cancer Father   . Colon cancer Maternal Grandmother     Social History History  Substance Use Topics  . Smoking status: Former Smoker -- 0.50 packs/day    Types: Cigarettes    Quit date: 06/25/2013  . Smokeless tobacco: Never Used  . Alcohol Use: Yes     Comment: occasioally     No Known Allergies  Current Outpatient Prescriptions  Medication Sig Dispense Refill  . lamoTRIgine (LAMICTAL) 100 MG tablet Take 100 mg by mouth daily.    . Linaclotide (LINZESS) 145 MCG CAPS capsule Take 1 capsule (145 mcg total) by mouth daily. 30 capsule 2  . Norgestimate-Ethinyl Estradiol Triphasic (TRINESSA, 28,) 0.18/0.215/0.25 MG-35 MCG tablet Take 1 tablet by mouth at bedtime. 1 Package 11  . pantoprazole (PROTONIX) 40 MG tablet     . fluconazole (DIFLUCAN) 150 MG  tablet Take 1 tablet (150 mg total) by mouth once. 1 tablet 2  . hydrocortisone (ANUCORT-HC) 25 MG suppository Place 1 suppository (25 mg total) rectally 2 (two) times daily. 10 suppository 0   No current facility-administered medications for this visit.    Review of Systems Review of Systems Constitutional: negative for fatigue and weight loss Respiratory: negative for cough and wheezing Cardiovascular: negative for chest pain, fatigue and palpitations Gastrointestinal: negative for abdominal pain and change in bowel habits Genitourinary:negative Integument/breast: negative for nipple discharge Musculoskeletal:negative for myalgias Neurological: negative for gait problems and tremors Behavioral/Psych: negative for abusive relationship, depression Endocrine: negative for temperature intolerance     Blood pressure 116/71, pulse 73, temperature 98.1 F (36.7 C), weight 172 lb (78.019 kg), last menstrual period 01/03/2015.  Physical Exam Physical Exam General:   alert  Skin:   no rash or abnormalities  Lungs:   clear to auscultation bilaterally  Heart:   regular rate and rhythm, S1, S2 normal, no murmur, click, rub or gallop  Breasts:   normal without suspicious masses, skin or nipple changes or axillary nodes  Abdomen:  normal findings: no organomegaly, soft, non-tender and no hernia  Pelvis:  External genitalia: normal general appearance Urinary system: urethral meatus normal and bladder without fullness, nontender Vaginal: normal without tenderness, induration or masses Cervix: normal appearance Adnexa: normal bimanual exam Uterus: anteverted and  non-tender, normal size      Data Reviewed Labs   Assessment    Candida vulvovaginitis.    Plan  Diflucan Rx   Orders Placed This Encounter  Procedures  . Urine culture  . SureSwab, Vaginosis/Vaginitis Plus  . POCT urinalysis dipstick   Meds ordered this encounter  Medications  . fluconazole (DIFLUCAN) 150 MG tablet     Sig: Take 1 tablet (150 mg total) by mouth once.    Dispense:  1 tablet    Refill:  2

## 2015-01-19 LAB — URINE CULTURE: Colony Count: >=100000

## 2015-01-20 ENCOUNTER — Other Ambulatory Visit: Payer: Self-pay | Admitting: Obstetrics

## 2015-01-21 ENCOUNTER — Other Ambulatory Visit: Payer: Self-pay | Admitting: Obstetrics

## 2015-01-21 DIAGNOSIS — N390 Urinary tract infection, site not specified: Secondary | ICD-10-CM

## 2015-01-21 MED ORDER — AMOXICILLIN 500 MG PO CAPS: 500.0000 mg | ORAL_CAPSULE | Freq: Three times a day (TID) | ORAL | Status: DC

## 2015-01-22 ENCOUNTER — Other Ambulatory Visit: Payer: Self-pay | Admitting: Obstetrics

## 2015-01-22 ENCOUNTER — Other Ambulatory Visit: Payer: Self-pay | Admitting: *Deleted

## 2015-01-22 DIAGNOSIS — N390 Urinary tract infection, site not specified: Secondary | ICD-10-CM

## 2015-01-22 LAB — SURESWAB, VAGINOSIS/VAGINITIS PLUS
Atopobium vaginae: NOT DETECTED Log (cells/mL)
C. albicans, DNA: DETECTED — AB
C. glabrata, DNA: NOT DETECTED
C. parapsilosis, DNA: DETECTED — AB
C. trachomatis RNA, TMA: NOT DETECTED
C. tropicalis, DNA: NOT DETECTED
GARDNERELLA VAGINALIS: 5.1 Log (cells/mL)
LACTOBACILLUS SPECIES: NOT DETECTED Log (cells/mL)
MEGASPHAERA SPECIES: NOT DETECTED Log (cells/mL)
N. GONORRHOEAE RNA, TMA: NOT DETECTED
T. vaginalis RNA, QL TMA: NOT DETECTED

## 2015-01-22 MED ORDER — AMOXICILLIN 500 MG PO CAPS: 500.0000 mg | ORAL_CAPSULE | Freq: Three times a day (TID) | ORAL | Status: DC

## 2015-02-03 ENCOUNTER — Other Ambulatory Visit: Payer: Self-pay | Admitting: *Deleted

## 2015-02-03 MED ORDER — LINACLOTIDE 145 MCG PO CAPS: 145.0000 ug | ORAL_CAPSULE | Freq: Every day | ORAL | Status: DC

## 2015-04-11 ENCOUNTER — Other Ambulatory Visit: Payer: Self-pay | Admitting: *Deleted

## 2015-04-11 ENCOUNTER — Other Ambulatory Visit: Payer: Self-pay | Admitting: Internal Medicine

## 2015-04-11 ENCOUNTER — Telehealth: Payer: Self-pay | Admitting: Internal Medicine

## 2015-04-11 MED ORDER — LUBIPROSTONE 24 MCG PO CAPS: 24.0000 ug | ORAL_CAPSULE | Freq: Two times a day (BID) | ORAL | Status: DC

## 2015-04-11 NOTE — Telephone Encounter (Signed)
Pt states linzess is not helping her anymore. Pt states she has tried the higher dose and would like to see if there is something else she could try. Please advise.

## 2015-04-11 NOTE — Telephone Encounter (Signed)
Spoke with patient and gave her recommendations. Rx sent to pharmacy. 

## 2015-04-11 NOTE — Telephone Encounter (Signed)
Left a message for patient to call back. 

## 2015-04-11 NOTE — Telephone Encounter (Signed)
Encourage hydration and can try lubiprostone 24 mcg BID

## 2015-04-11 NOTE — Telephone Encounter (Signed)
Pt states that linzess is not helping her .

## 2015-07-16 ENCOUNTER — Encounter: Payer: Self-pay | Admitting: Obstetrics

## 2015-07-16 ENCOUNTER — Ambulatory Visit (INDEPENDENT_AMBULATORY_CARE_PROVIDER_SITE_OTHER): Payer: BLUE CROSS/BLUE SHIELD | Admitting: Obstetrics

## 2015-07-16 VITALS — BP 121/79 | HR 77 | Temp 98.0°F | Ht 69.5 in | Wt 169.0 lb

## 2015-07-16 DIAGNOSIS — N9489 Other specified conditions associated with female genital organs and menstrual cycle: Secondary | ICD-10-CM | POA: Diagnosis not present

## 2015-07-16 DIAGNOSIS — Z3041 Encounter for surveillance of contraceptive pills: Secondary | ICD-10-CM | POA: Diagnosis not present

## 2015-07-16 DIAGNOSIS — B373 Candidiasis of vulva and vagina: Secondary | ICD-10-CM | POA: Diagnosis not present

## 2015-07-16 DIAGNOSIS — N760 Acute vaginitis: Secondary | ICD-10-CM

## 2015-07-16 DIAGNOSIS — Z113 Encounter for screening for infections with a predominantly sexual mode of transmission: Secondary | ICD-10-CM

## 2015-07-16 DIAGNOSIS — N898 Other specified noninflammatory disorders of vagina: Secondary | ICD-10-CM

## 2015-07-16 DIAGNOSIS — B9689 Other specified bacterial agents as the cause of diseases classified elsewhere: Secondary | ICD-10-CM

## 2015-07-16 DIAGNOSIS — A499 Bacterial infection, unspecified: Secondary | ICD-10-CM

## 2015-07-16 DIAGNOSIS — B3731 Acute candidiasis of vulva and vagina: Secondary | ICD-10-CM

## 2015-07-16 DIAGNOSIS — Z01419 Encounter for gynecological examination (general) (routine) without abnormal findings: Secondary | ICD-10-CM

## 2015-07-16 MED ORDER — TINIDAZOLE 500 MG PO TABS: 1000.0000 mg | ORAL_TABLET | Freq: Every day | ORAL | Status: DC

## 2015-07-16 MED ORDER — FLUCONAZOLE 150 MG PO TABS: 150.0000 mg | ORAL_TABLET | Freq: Once | ORAL | Status: DC

## 2015-07-16 MED ORDER — NORGESTIM-ETH ESTRAD TRIPHASIC 0.18/0.215/0.25 MG-35 MCG PO TABS: 1.0000 | ORAL_TABLET | Freq: Every day | ORAL | Status: DC

## 2015-07-16 NOTE — Progress Notes (Addendum)
Subjective:        Danielle Prince is a 19 y.o. female here for a routine exam.  Current complaints: Malodorous vaginal discharge.    Personal health questionnaire:  Is patient Ashkenazi Jewish, have a family history of breast and/or ovarian cancer: no Is there a family history of uterine cancer diagnosed at age < 63, gastrointestinal cancer, urinary tract cancer, family member who is a Personnel officer syndrome-associated carrier: no Is the patient overweight and hypertensive, family history of diabetes, personal history of gestational diabetes, preeclampsia or PCOS: no Is patient over 35, have PCOS,  family history of premature CHD under age 33, diabetes, smoke, have hypertension or peripheral artery disease:  no At any time, has a partner hit, kicked or otherwise hurt or frightened you?: no Over the past 2 weeks, have you felt down, depressed or hopeless?: no Over the past 2 weeks, have you felt little interest or pleasure in doing things?:no   Gynecologic History Patient's last menstrual period was 06/20/2015. Contraception: OCP (estrogen/progesterone) Last Pap: n/a. Results were: n/a Last mammogram: n/a. Results were: n/a  Obstetric History OB History  Gravida Para Term Preterm AB SAB TAB Ectopic Multiple Living         Past Medical History  Diagnosis Date  . Anorexia nervosa with bulimia   . Depression   . Anxiety   . ADHD (attention deficit hyperactivity disorder)   . Eating disorder   . Urinary tract infection   . Varicella   . Vision abnormalities   . Electrolyte disturbance 2010, 2012    hypokalemia, hypochloremia.  . Aspirin overdose 02/2012  . ADHD (attention deficit hyperactivity disorder)   . Dysrhythmia     Past Surgical History  Procedure Laterality Date  . Gum surgery    . Colonoscopy with esophagogastroduodenoscopy (egd) N/A 10/28/2013    Procedure: COLONOSCOPY WITH ESOPHAGOGASTRODUODENOSCOPY (EGD);  Surgeon: Beverley Fiedler, MD;   Location: [facility] ENDOSCOPY;  Service: Endoscopy;  Laterality: N/A;     Current outpatient prescriptions:  .  Norgestimate-Ethinyl Estradiol Triphasic (TRINESSA, 28,) 0.18/0.215/0.25 MG-35 MCG tablet, Take 1 tablet by mouth at bedtime., Disp: 1 Package, Rfl: 11 .  fluconazole (DIFLUCAN) 150 MG tablet, Take 1 tablet (150 mg total) by mouth once., Disp: 1 tablet, Rfl: 2 .  lamoTRIgine (LAMICTAL) 100 MG tablet, Take 100 mg by mouth daily., Disp: , Rfl:  .  tinidazole (TINDAMAX) 500 MG tablet, Take 2 tablets (1,000 mg total) by mouth daily with breakfast., Disp: 10 tablet, Rfl: 2 No Known Allergies  History  Substance Use Topics  . Smoking status: Former Smoker -- 0.50 packs/day    Types: Cigarettes    Quit date: 06/25/2013  . Smokeless tobacco: Never Used  . Alcohol Use: 0.0 oz/week    0 Standard drinks or equivalent per week     Comment: occasioally     Family History  Problem Relation Age of Onset  . Depression Sister   . Depression Paternal Aunt   . Drug abuse Maternal Uncle     deceased  . Drug abuse Paternal Aunt   . Cancer Father   . Colon cancer Maternal Grandmother       Review of Systems  Constitutional: negative for fatigue and weight loss Respiratory: negative for cough and wheezing Cardiovascular: negative for chest pain, fatigue and palpitations Gastrointestinal: negative for abdominal pain and change in bowel habits Musculoskeletal:negative for myalgias Neurological: negative for gait problems and tremors Behavioral/Psych:  positive for depression and ADHD Endocrine: negative for temperature intolerance   Genitourinary:negative for abnormal menstrual periods, genital lesions, hot flashes, sexual problems.  Positive for vaginal discharge with fishy odor Integument/breast: negative for breast lump, breast tenderness, nipple discharge and skin lesion(s)    Objective:       BP 121/79 mmHg  Pulse 77  Temp(Src) 98 F (36.7 C)  Ht 5' 9.5" (1.765 m)  Wt 169 lb  (76.658 kg)  BMI 24.61 kg/m2  LMP 06/20/2015 General:   alert  Skin:   no rash or abnormalities  Lungs:   clear to auscultation bilaterally  Heart:   regular rate and rhythm, S1, S2 normal, no murmur, click, rub or gallop  Breasts:   normal without suspicious masses, skin or nipple changes or axillary nodes  Abdomen:  normal findings: no organomegaly, soft, non-tender and no hernia  Pelvis:  External genitalia: normal general appearance Urinary system: urethral meatus normal and bladder without fullness, nontender Vaginal: normal without tenderness, induration or masses Cervix: normal appearance Adnexa: normal bimanual exam Uterus: anteverted and non-tender, normal size   Lab Review Urine pregnancy test Labs reviewed yes Radiologic studies reviewed no    Assessment:    Healthy female exam.    BV  Contraceptive management.  Pleased with OCP's.  Depression.  Stable  ADHD.  Stable   Plan:   Tindamax Rx   Education reviewed: depression evaluation, low fat, low cholesterol diet, safe sex/STD prevention and weight bearing exercise. Contraception: OCP (estrogen/progesterone). Follow up in: 1 year.   Meds ordered this encounter  Medications  . tinidazole (TINDAMAX) 500 MG tablet    Sig: Take 2 tablets (1,000 mg total) by mouth daily with breakfast.    Dispense:  10 tablet    Refill:  2  . Norgestimate-Ethinyl Estradiol Triphasic (TRINESSA, 28,) 0.18/0.215/0.25 MG-35 MCG tablet    Sig: Take 1 tablet by mouth at bedtime.    Dispense:  1 Package    Refill:  11  . fluconazole (DIFLUCAN) 150 MG tablet    Sig: Take 1 tablet (150 mg total) by mouth once.    Dispense:  1 tablet    Refill:  2   Orders Placed This Encounter  Procedures  . SureSwab, Vaginosis/Vaginitis Plus  . HIV antibody  . Hepatitis B surface antigen  . RPR  . Hepatitis C antibody

## 2015-07-16 NOTE — Patient Instructions (Signed)
Bacterial Vaginosis Bacterial vaginosis is a vaginal infection that occurs when the normal balance of bacteria in the vagina is disrupted. It results from an overgrowth of certain bacteria. This is the most common vaginal infection in women of childbearing age. Treatment is important to prevent complications, especially in pregnant women, as it can cause a premature delivery. CAUSES  Bacterial vaginosis is caused by an increase in harmful bacteria that are normally present in smaller amounts in the vagina. Several different kinds of bacteria can cause bacterial vaginosis. However, the reason that the condition develops is not fully understood. RISK FACTORS Certain activities or behaviors can put you at an increased risk of developing bacterial vaginosis, including:  Having a new sex partner or multiple sex partners.  Douching.  Using an intrauterine device (IUD) for contraception. Women do not get bacterial vaginosis from toilet seats, bedding, swimming pools, or contact with objects around them. SIGNS AND SYMPTOMS  Some women with bacterial vaginosis have no signs or symptoms. Common symptoms include:  Grey vaginal discharge.  A fishlike odor with discharge, especially after sexual intercourse.  Itching or burning of the vagina and vulva.  Burning or pain with urination. DIAGNOSIS  Your health care provider will take a medical history and examine the vagina for signs of bacterial vaginosis. A sample of vaginal fluid may be taken. Your health care provider will look at this sample under a microscope to check for bacteria and abnormal cells. A vaginal pH test may also be done.  TREATMENT  Bacterial vaginosis may be treated with antibiotic medicines. These may be given in the form of a pill or a vaginal cream. A second round of antibiotics may be prescribed if the condition comes back after treatment.  HOME CARE INSTRUCTIONS   Only take over-the-counter or prescription medicines as  directed by your health care provider.  If antibiotic medicine was prescribed, take it as directed. Make sure you finish it even if you start to feel better.  Do not have sex until treatment is completed.  Tell all sexual partners that you have a vaginal infection. They should see their health care provider and be treated if they have problems, such as a mild rash or itching.  Practice safe sex by using condoms and only having one sex partner. SEEK MEDICAL CARE IF:   Your symptoms are not improving after 3 days of treatment.  You have increased discharge or pain.  You have a fever. MAKE SURE YOU:   Understand these instructions.  Will watch your condition.  Will get help right away if you are not doing well or get worse. FOR MORE INFORMATION  Centers for Disease Control and Prevention, Division of STD Prevention: www.cdc.gov/std American Sexual Health Association (ASHA): www.ashastd.org  Document Released: 12/06/2005 Document Revised: 09/26/2013 Document Reviewed: 07/18/2013 ExitCare Patient Information 2015 [employer]. This information is not intended to replace advice given to you by your health care provider. Make sure you discuss any questions you have with your health care provider.  

## 2015-07-17 LAB — HIV ANTIBODY (ROUTINE TESTING W REFLEX): HIV 1&2 Ab, 4th Generation: NONREACTIVE

## 2015-07-17 LAB — HEPATITIS C ANTIBODY: HCV Ab: NEGATIVE

## 2015-07-17 LAB — HEPATITIS B SURFACE ANTIGEN: HEP B S AG: NEGATIVE

## 2015-07-17 LAB — RPR

## 2015-07-20 LAB — SURESWAB, VAGINOSIS/VAGINITIS PLUS
ATOPOBIUM VAGINAE: 7.7 Log (cells/mL)
C. TROPICALIS, DNA: NOT DETECTED
C. albicans, DNA: NOT DETECTED
C. glabrata, DNA: NOT DETECTED
C. parapsilosis, DNA: DETECTED — AB
C. trachomatis RNA, TMA: NOT DETECTED
LACTOBACILLUS SPECIES: NOT DETECTED Log (cells/mL)
MEGASPHAERA SPECIES: 8 Log (cells/mL)
N. gonorrhoeae RNA, TMA: NOT DETECTED
T. VAGINALIS RNA, QL TMA: NOT DETECTED

## 2015-07-21 ENCOUNTER — Other Ambulatory Visit: Payer: Self-pay | Admitting: Obstetrics

## 2015-10-16 ENCOUNTER — Telehealth: Payer: Self-pay | Admitting: *Deleted

## 2015-10-16 ENCOUNTER — Other Ambulatory Visit: Payer: Self-pay | Admitting: *Deleted

## 2015-10-16 DIAGNOSIS — B373 Candidiasis of vulva and vagina: Secondary | ICD-10-CM

## 2015-10-16 DIAGNOSIS — B3731 Acute candidiasis of vulva and vagina: Secondary | ICD-10-CM

## 2015-10-16 MED ORDER — FLUCONAZOLE 150 MG PO TABS: 150.0000 mg | ORAL_TABLET | Freq: Once | ORAL | Status: DC

## 2015-10-16 NOTE — Telephone Encounter (Signed)
Patient is requesting a refill of her Diflucan- OK to refill per Dr Clearance CootsHarper. 1:32 Rx sent to pharmacy.

## 2015-10-27 ENCOUNTER — Encounter: Payer: Self-pay | Admitting: Obstetrics

## 2015-10-27 ENCOUNTER — Ambulatory Visit (INDEPENDENT_AMBULATORY_CARE_PROVIDER_SITE_OTHER): Payer: BLUE CROSS/BLUE SHIELD | Admitting: Obstetrics

## 2015-10-27 VITALS — BP 117/74 | HR 83 | Temp 98.3°F | Ht 70.0 in | Wt 181.0 lb

## 2015-10-27 DIAGNOSIS — R399 Unspecified symptoms and signs involving the genitourinary system: Secondary | ICD-10-CM

## 2015-10-27 DIAGNOSIS — N898 Other specified noninflammatory disorders of vagina: Secondary | ICD-10-CM

## 2015-10-27 NOTE — Progress Notes (Signed)
Patient ID: Danielle Prince, female   DOB: 07-20-1996, 19 y.o.   MRN: [id number]  Chief Complaint  Patient presents with  . Vaginal Discharge  . Urinary Tract Infection    HPI Danielle Prince is a 19 y.o. female.  Vaginal discharge and irritation and sometimes burning with urination.  HPI  Past Medical History  Diagnosis Date  . Anorexia nervosa with bulimia   . Depression   . Anxiety   . ADHD (attention deficit hyperactivity disorder)   . Eating disorder   . Urinary tract infection   . Varicella   . Vision abnormalities   . Electrolyte disturbance 2010, 2012    hypokalemia, hypochloremia.  . Aspirin overdose 02/2012  . ADHD (attention deficit hyperactivity disorder)   . Dysrhythmia     Past Surgical History  Procedure Laterality Date  . Gum surgery    . Colonoscopy with esophagogastroduodenoscopy (egd) N/A 10/28/2013    Procedure: COLONOSCOPY WITH ESOPHAGOGASTRODUODENOSCOPY (EGD);  Surgeon: Beverley Fiedler, MD;  Location: [facility] ENDOSCOPY;  Service: Endoscopy;  Laterality: N/A;    Family History  Problem Relation Age of Onset  . Depression Sister   . Depression Paternal Aunt   . Drug abuse Maternal Uncle     deceased  . Drug abuse Paternal Aunt   . Cancer Father   . Colon cancer Maternal Grandmother     Social History Social History  Substance Use Topics  . Smoking status: Former Smoker -- 0.50 packs/day    Types: Cigarettes    Quit date: 06/25/2013  . Smokeless tobacco: Never Used  . Alcohol Use: 0.0 oz/week    0 Standard drinks or equivalent per week     Comment: occasioally     No Known Allergies  Current Outpatient Prescriptions  Medication Sig Dispense Refill  . Norgestimate-Ethinyl Estradiol Triphasic (TRINESSA, 28,) 0.18/0.215/0.25 MG-35 MCG tablet Take 1 tablet by mouth at bedtime. 1 Package 11  . lamoTRIgine (LAMICTAL) 100 MG tablet Take 100 mg by mouth daily.     No current facility-administered medications for this visit.    Review of  Systems Review of Systems Constitutional: negative for fatigue and weight loss Respiratory: negative for cough and wheezing Cardiovascular: negative for chest pain, fatigue and palpitations  Gastrointestinal: negative for abdominal pain and change in bowel habits Genitourinary:negative Integument/breast: negative for nipple discharge Musculoskeletal:negative for myalgias Neurological: negative for gait problems and tremors Behavioral/Psych: negative for abusive relationship, depression Endocrine: negative for temperature intolerance     Blood pressure 117/74, pulse 83, temperature 98.3 F (36.8 C), height  (1.778 m), weight 181 lb (82.101 kg), last menstrual period 10/21/2015.  Physical Exam Physical Exam General:   alert  Skin:   no rash or abnormalities  Lungs:   clear to auscultation bilaterally  Heart:   regular rate and rhythm, S1, S2 normal, no murmur, click, rub or gallop  Breasts:   normal without suspicious masses, skin or nipple changes or axillary nodes  Abdomen:  normal findings: no organomegaly, soft, non-tender and no hernia  Pelvis:  External genitalia: normal general appearance Urinary system: urethral meatus normal and bladder without fullness, nontender Vaginal: normal without tenderness, induration or masses Cervix: normal appearance Adnexa: normal bimanual exam Uterus: anteverted and non-tender, normal size      Data Reviewed Previous labs  Assessment     Vaginal discharge and irritation.  UTI symptoms.     Plan    Wet prep ordered Urine culture ordered  Orders Placed This Encounter  Procedures  .  SureSwab, Vaginosis/Vaginitis Plus  . Urine culture   No orders of the defined types were placed in this encounter.

## 2015-10-28 ENCOUNTER — Encounter: Payer: Self-pay | Admitting: Obstetrics

## 2015-10-28 ENCOUNTER — Other Ambulatory Visit: Payer: BLUE CROSS/BLUE SHIELD

## 2015-10-28 DIAGNOSIS — Z113 Encounter for screening for infections with a predominantly sexual mode of transmission: Secondary | ICD-10-CM

## 2015-10-28 LAB — URINE CULTURE
COLONY COUNT: NO GROWTH
Organism ID, Bacteria: NO GROWTH

## 2015-10-29 LAB — HEPATITIS B SURFACE ANTIGEN: HEP B S AG: NEGATIVE

## 2015-10-29 LAB — HEPATITIS C ANTIBODY: HCV AB: NEGATIVE

## 2015-10-29 LAB — RPR

## 2015-10-29 LAB — HIV ANTIBODY (ROUTINE TESTING W REFLEX): HIV 1&2 Ab, 4th Generation: NONREACTIVE

## 2015-10-30 LAB — SURESWAB, VAGINOSIS/VAGINITIS PLUS
Atopobium vaginae: NOT DETECTED Log (cells/mL)
C. ALBICANS, DNA: NOT DETECTED
C. GLABRATA, DNA: NOT DETECTED
C. parapsilosis, DNA: NOT DETECTED
C. trachomatis RNA, TMA: NOT DETECTED
C. tropicalis, DNA: NOT DETECTED
GARDNERELLA VAGINALIS: 5.3 Log (cells/mL)
LACTOBACILLUS SPECIES: 7.5 Log (cells/mL)
MEGASPHAERA SPECIES: NOT DETECTED Log (cells/mL)
N. GONORRHOEAE RNA, TMA: NOT DETECTED
T. VAGINALIS RNA, QL TMA: NOT DETECTED

## 2015-11-05 ENCOUNTER — Telehealth: Payer: Self-pay | Admitting: *Deleted

## 2015-11-05 NOTE — Telephone Encounter (Signed)
Patient contacted the office requesting lab results. Attempted to contact the patient and left message for patient to call the office.  

## 2015-11-07 NOTE — Telephone Encounter (Signed)
Patient returned call to the office. Attempted to contact the patient and left message for patient to call the office.  °

## 2015-11-10 NOTE — Telephone Encounter (Signed)
Attempted to contact the patient and left message for patient to call the office.  

## 2015-11-20 NOTE — Telephone Encounter (Signed)
Patient has not returned call to office. Will re file call

## 2016-03-14 ENCOUNTER — Other Ambulatory Visit: Payer: Self-pay | Admitting: Obstetrics

## 2016-11-25 ENCOUNTER — Ambulatory Visit: Payer: Self-pay | Admitting: Obstetrics

## 2016-12-22 ENCOUNTER — Encounter: Payer: Self-pay | Admitting: Obstetrics

## 2016-12-22 ENCOUNTER — Ambulatory Visit (INDEPENDENT_AMBULATORY_CARE_PROVIDER_SITE_OTHER): Payer: BLUE CROSS/BLUE SHIELD | Admitting: Obstetrics

## 2016-12-22 VITALS — BP 114/78 | HR 69 | Ht 69.0 in | Wt 161.2 lb

## 2016-12-22 DIAGNOSIS — Z1151 Encounter for screening for human papillomavirus (HPV): Secondary | ICD-10-CM | POA: Diagnosis not present

## 2016-12-22 DIAGNOSIS — Z113 Encounter for screening for infections with a predominantly sexual mode of transmission: Secondary | ICD-10-CM

## 2016-12-22 DIAGNOSIS — Z124 Encounter for screening for malignant neoplasm of cervix: Secondary | ICD-10-CM | POA: Diagnosis not present

## 2016-12-22 DIAGNOSIS — Z01419 Encounter for gynecological examination (general) (routine) without abnormal findings: Secondary | ICD-10-CM

## 2016-12-22 DIAGNOSIS — Z0000 Encounter for general adult medical examination without abnormal findings: Secondary | ICD-10-CM | POA: Diagnosis not present

## 2016-12-22 DIAGNOSIS — Z3041 Encounter for surveillance of contraceptive pills: Secondary | ICD-10-CM

## 2016-12-22 DIAGNOSIS — K5901 Slow transit constipation: Secondary | ICD-10-CM

## 2016-12-22 NOTE — Progress Notes (Signed)
Subjective:        Danielle Prince is a 21 y.o. female here for a routine exam.  Current complaints: constipation, chronic.    Personal health questionnaire:  Is patient Ashkenazi Jewish, have a family history of breast and/or ovarian cancer: no Is there a family history of uterine cancer diagnosed at age < 5, gastrointestinal cancer, urinary tract cancer, family member who is a Personnel officer syndrome-associated carrier: no Is the patient overweight and hypertensive, family history of diabetes, personal history of gestational diabetes, preeclampsia or PCOS: no Is patient over 59, have PCOS,  family history of premature CHD under age 37, diabetes, smoke, have hypertension or peripheral artery disease:  no At any time, has a partner hit, kicked or otherwise hurt or frightened you?: no Over the past 2 weeks, have you felt down, depressed or hopeless?: no Over the past 2 weeks, have you felt little interest or pleasure in doing things?:no   Gynecologic History Patient's last menstrual period was 12/15/2016. Contraception: OCP (estrogen/progesterone) Last Pap: n/a. Results were: n/a Last mammogram: n/a. Results were: n/a  Obstetric History OB History  Gravida Para Term Preterm AB Living  1 0 0 0 1 0  SAB TAB Ectopic Multiple Live Births  0 1 0 0      # Outcome Date GA Lbr Len/2nd Weight Sex Delivery Anes PTL Lv  1 TAB               Past Medical History:  Diagnosis Date  . ADHD (attention deficit hyperactivity disorder)   . ADHD (attention deficit hyperactivity disorder)   . Anorexia nervosa with bulimia   . Anxiety   . Aspirin overdose 02/2012  . Depression   . Dysrhythmia   . Eating disorder   . Electrolyte disturbance 2010, 2012   hypokalemia, hypochloremia.  . Urinary tract infection   . Varicella   . Vision abnormalities     Past Surgical History:  Procedure Laterality Date  . COLONOSCOPY WITH ESOPHAGOGASTRODUODENOSCOPY (EGD) N/A 10/28/2013   Procedure: COLONOSCOPY  WITH ESOPHAGOGASTRODUODENOSCOPY (EGD);  Surgeon: Beverley Fiedler, MD;  Location: [facility] ENDOSCOPY;  Service: Endoscopy;  Laterality: N/A;  . GUM SURGERY    . INDUCED ABORTION       Current Outpatient Prescriptions:  .  Norgestimate-Ethinyl Estradiol Triphasic 0.18/0.215/0.25 MG-35 MCG tablet, TAKE 1 TABLET DAILY, Disp: 84 tablet, Rfl: 3 No Known Allergies  Social History  Substance Use Topics  . Smoking status: Former Smoker    Packs/day: 0.50    Types: Cigarettes    Quit date: 06/25/2013  . Smokeless tobacco: Never Used  . Alcohol use 0.0 oz/week     Comment: occasioally     Family History  Problem Relation Age of Onset  . Cancer Father   . Depression Sister   . Depression Paternal Aunt   . Drug abuse Maternal Uncle     deceased  . Drug abuse Paternal Aunt   . Colon cancer Maternal Grandmother       Review of Systems  Constitutional: negative for fatigue and weight loss Respiratory: negative for cough and wheezing Cardiovascular: negative for chest pain, fatigue and palpitations Gastrointestinal:positive for chronic constipation Musculoskeletal:negative for myalgias Neurological: negative for gait problems and tremors Behavioral/Psych: negative for abusive relationship, depression Endocrine: negative for temperature intolerance    Genitourinary:negative for abnormal menstrual periods, genital lesions, hot flashes, sexual problems and vaginal discharge Integument/breast: negative for breast lump, breast tenderness, nipple discharge and skin lesion(s)    Objective:  BP 114/78   Pulse 69   Ht 5\' 9"  (1.753 m)   Wt 161 lb 3.2 oz (73.1 kg)   LMP 12/15/2016   BMI 23.81 kg/m  General:   alert  Skin:   no rash or abnormalities  Lungs:   clear to auscultation bilaterally  Heart:   regular rate and rhythm, S1, S2 normal, no murmur, click, rub or gallop  Breasts:   normal without suspicious masses, skin or nipple changes or axillary nodes  Abdomen:  normal findings: no  organomegaly, soft, non-tender and no hernia  Pelvis:  External genitalia: normal general appearance Urinary system: urethral meatus normal and bladder without fullness, nontender Vaginal: normal without tenderness, induration or masses Cervix: normal appearance Adnexa: normal bimanual exam Uterus: anteverted and non-tender, normal size   Lab Review Urine pregnancy test Labs reviewed yes Radiologic studies reviewed no  50% of 20 min visit spent on counseling and coordination of care.    Assessment:    Healthy female exam.    Contraceptive Surveillance.  Pleased with OCP.   Plan:    Education reviewed: calcium supplements, low fat, low cholesterol diet, safe sex/STD prevention, self breast exams and weight bearing exercise. Contraception: OCP (estrogen/progesterone). Follow up in: 1 year.   No orders of the defined types were placed in this encounter.  No orders of the defined types were placed in this encounter.    Patient ID: Danielle Prince, female   DOB: Aug 20, 1996, 21 y.o.   MRN: [id number]

## 2016-12-23 LAB — CERVICOVAGINAL ANCILLARY ONLY
Bacterial vaginitis: NEGATIVE
Candida vaginitis: NEGATIVE
Trichomonas: NEGATIVE

## 2016-12-24 LAB — CYTOLOGY - PAP: Diagnosis: NEGATIVE

## 2017-02-08 ENCOUNTER — Emergency Department (HOSPITAL_COMMUNITY)
Admission: EM | Admit: 2017-02-08 | Discharge: 2017-02-09 | Disposition: A | Discharge status: Other Acute Inpatient Hospital | Payer: BLUE CROSS/BLUE SHIELD | Attending: Emergency Medicine | Admitting: Emergency Medicine

## 2017-02-08 ENCOUNTER — Encounter (HOSPITAL_COMMUNITY): Payer: Self-pay | Admitting: Emergency Medicine

## 2017-02-08 DIAGNOSIS — F320 Major depressive disorder, single episode, mild: Secondary | ICD-10-CM

## 2017-02-08 DIAGNOSIS — F909 Attention-deficit hyperactivity disorder, unspecified type: Secondary | ICD-10-CM | POA: Insufficient documentation

## 2017-02-08 DIAGNOSIS — Z79899 Other long term (current) drug therapy: Secondary | ICD-10-CM | POA: Diagnosis not present

## 2017-02-08 DIAGNOSIS — Z87891 Personal history of nicotine dependence: Secondary | ICD-10-CM | POA: Insufficient documentation

## 2017-02-08 DIAGNOSIS — F329 Major depressive disorder, single episode, unspecified: Secondary | ICD-10-CM

## 2017-02-08 DIAGNOSIS — F32A Depression, unspecified: Secondary | ICD-10-CM

## 2017-02-08 LAB — COMPREHENSIVE METABOLIC PANEL
ALT: 14 U/L (ref 14–54)
AST: 20 U/L (ref 15–41)
Albumin: 4.2 g/dL (ref 3.5–5.0)
Alkaline Phosphatase: 61 U/L (ref 38–126)
Anion gap: 8 (ref 5–15)
BILIRUBIN TOTAL: 0.8 mg/dL (ref 0.3–1.2)
BUN: 10 mg/dL (ref 6–20)
CALCIUM: 9.7 mg/dL (ref 8.9–10.3)
CO2: 24 mmol/L (ref 22–32)
CREATININE: 0.83 mg/dL (ref 0.44–1.00)
Chloride: 106 mmol/L (ref 101–111)
GFR calc non Af Amer: >60 mL/min (ref >60)
Glucose, Bld: 113 mg/dL — ABNORMAL HIGH (ref 65–99)
Potassium: 3.9 mmol/L (ref 3.5–5.1)
SODIUM: 138 mmol/L (ref 135–145)
Total Protein: 7.8 g/dL (ref 6.5–8.1)

## 2017-02-08 LAB — CBC
HEMATOCRIT: 41.3 % (ref 36.0–46.0)
HEMOGLOBIN: 14.4 g/dL (ref 12.0–15.0)
MCH: 29.7 pg (ref 26.0–34.0)
MCHC: 34.9 g/dL (ref 30.0–36.0)
MCV: 85.2 fL (ref 78.0–100.0)
Platelets: 264 10*3/uL (ref 150–400)
RBC: 4.85 MIL/uL (ref 3.87–5.11)
RDW: 12.7 % (ref 11.5–15.5)
WBC: 10 10*3/uL (ref 4.0–10.5)

## 2017-02-08 LAB — ACETAMINOPHEN LEVEL: Acetaminophen (Tylenol), Serum: <10 ug/mL — ABNORMAL LOW (ref 10–30)

## 2017-02-08 LAB — ETHANOL: Alcohol, Ethyl (B): <5 mg/dL (ref <5)

## 2017-02-08 LAB — SALICYLATE LEVEL

## 2017-02-08 NOTE — ED Triage Notes (Signed)
Pt c/o depression and manic symptoms; pt states she has Bipolar disorder; reports her boyfriend just moved out of state which spurred these symptoms; lists crying spells, panic attacks, insomnia, and not eating as her manic sx; denies SI/HI and hallucinations

## 2017-02-09 ENCOUNTER — Inpatient Hospital Stay (HOSPITAL_COMMUNITY)
Admission: EM | Admit: 2017-02-09 | Discharge: 2017-02-13 | DRG: 885 | Disposition: A | Discharge status: Home / Self Care | Payer: BLUE CROSS/BLUE SHIELD | Source: Intra-hospital | Attending: Psychiatry | Admitting: Psychiatry

## 2017-02-09 ENCOUNTER — Encounter (HOSPITAL_COMMUNITY): Payer: Self-pay | Admitting: *Deleted

## 2017-02-09 DIAGNOSIS — Z818 Family history of other mental and behavioral disorders: Secondary | ICD-10-CM

## 2017-02-09 DIAGNOSIS — F332 Major depressive disorder, recurrent severe without psychotic features: Secondary | ICD-10-CM | POA: Diagnosis present

## 2017-02-09 DIAGNOSIS — Z814 Family history of other substance abuse and dependence: Secondary | ICD-10-CM

## 2017-02-09 DIAGNOSIS — G4700 Insomnia, unspecified: Secondary | ICD-10-CM | POA: Diagnosis present

## 2017-02-09 DIAGNOSIS — R4581 Low self-esteem: Secondary | ICD-10-CM | POA: Diagnosis present

## 2017-02-09 DIAGNOSIS — Z639 Problem related to primary support group, unspecified: Secondary | ICD-10-CM | POA: Diagnosis not present

## 2017-02-09 DIAGNOSIS — Z8659 Personal history of other mental and behavioral disorders: Secondary | ICD-10-CM | POA: Diagnosis not present

## 2017-02-09 DIAGNOSIS — F411 Generalized anxiety disorder: Secondary | ICD-10-CM | POA: Diagnosis present

## 2017-02-09 DIAGNOSIS — Z813 Family history of other psychoactive substance abuse and dependence: Secondary | ICD-10-CM

## 2017-02-09 DIAGNOSIS — Z87891 Personal history of nicotine dependence: Secondary | ICD-10-CM | POA: Diagnosis not present

## 2017-02-09 DIAGNOSIS — Z79899 Other long term (current) drug therapy: Secondary | ICD-10-CM | POA: Diagnosis not present

## 2017-02-09 DIAGNOSIS — R45851 Suicidal ideations: Secondary | ICD-10-CM | POA: Diagnosis present

## 2017-02-09 DIAGNOSIS — Z5689 Other problems related to employment: Secondary | ICD-10-CM

## 2017-02-09 DIAGNOSIS — F339 Major depressive disorder, recurrent, unspecified: Secondary | ICD-10-CM | POA: Diagnosis present

## 2017-02-09 DIAGNOSIS — F410 Panic disorder [episodic paroxysmal anxiety] without agoraphobia: Secondary | ICD-10-CM | POA: Diagnosis present

## 2017-02-09 DIAGNOSIS — Z800 Family history of malignant neoplasm of digestive organs: Secondary | ICD-10-CM | POA: Diagnosis not present

## 2017-02-09 DIAGNOSIS — Z793 Long term (current) use of hormonal contraceptives: Secondary | ICD-10-CM

## 2017-02-09 DIAGNOSIS — F320 Major depressive disorder, single episode, mild: Secondary | ICD-10-CM | POA: Diagnosis not present

## 2017-02-09 DIAGNOSIS — Z915 Personal history of self-harm: Secondary | ICD-10-CM

## 2017-02-09 MED ORDER — NORGESTIM-ETH ESTRAD TRIPHASIC 0.18/0.215/0.25 MG-35 MCG PO TABS: 1.0000 | ORAL_TABLET | Freq: Every day | ORAL | Status: DC

## 2017-02-09 MED ORDER — ACETAMINOPHEN 325 MG PO TABS: 650.0000 mg | ORAL_TABLET | Freq: Four times a day (QID) | ORAL | Status: DC | PRN

## 2017-02-09 MED ORDER — TRAZODONE HCL 50 MG PO TABS
50.0000 mg | ORAL_TABLET | Freq: Every evening | ORAL | Status: DC | PRN
  Administered 2017-02-10 – 2017-02-12 (×2): 50 mg via ORAL
  Filled 2017-02-09 (×11): qty 1

## 2017-02-09 MED ORDER — ALUM & MAG HYDROXIDE-SIMETH 200-200-20 MG/5ML PO SUSP: 30.0000 mL | ORAL | Status: DC | PRN

## 2017-02-09 MED ORDER — HYDROXYZINE HCL 25 MG PO TABS
25.0000 mg | ORAL_TABLET | Freq: Four times a day (QID) | ORAL | Status: DC | PRN
  Administered 2017-02-09 – 2017-02-11 (×4): 25 mg via ORAL
  Filled 2017-02-09 (×4): qty 1

## 2017-02-09 MED ORDER — FLUOXETINE HCL 20 MG PO CAPS
20.0000 mg | ORAL_CAPSULE | Freq: Every day | ORAL | Status: DC
  Administered 2017-02-09: 20 mg via ORAL
  Filled 2017-02-09: qty 1

## 2017-02-09 MED ORDER — GABAPENTIN 300 MG PO CAPS
300.0000 mg | ORAL_CAPSULE | Freq: Three times a day (TID) | ORAL | Status: DC
  Administered 2017-02-09 – 2017-02-13 (×14): 300 mg via ORAL
  Filled 2017-02-09 (×17): qty 1

## 2017-02-09 MED ORDER — ARIPIPRAZOLE 2 MG PO TABS
2.0000 mg | ORAL_TABLET | Freq: Every day | ORAL | Status: DC
  Administered 2017-02-09 – 2017-02-13 (×5): 2 mg via ORAL
  Filled 2017-02-09 (×6): qty 1

## 2017-02-09 MED ORDER — FLUOXETINE HCL 20 MG PO CAPS
40.0000 mg | ORAL_CAPSULE | Freq: Every day | ORAL | Status: DC
  Administered 2017-02-10 – 2017-02-11 (×2): 40 mg via ORAL
  Filled 2017-02-09 (×3): qty 2

## 2017-02-09 MED ORDER — MAGNESIUM HYDROXIDE 400 MG/5ML PO SUSP: 30.0000 mL | Freq: Every day | ORAL | Status: DC | PRN
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
## 2017-02-09 NOTE — ED Provider Notes (Signed)
[facility]-EMERGENCY DEPT Provider Note   CSN: [id number] Arrival date & time: 02/08/17  2201  By signing my name below, I, Danielle Prince, attest that this documentation has been prepared under the direction and in the presence of non-physician practitioner, Audry Piliyler Yevette Knust, PA-C. Electronically Signed: Modena JanskyAlbert Prince, Scribe. 02/09/2017. 12:02 AM.  History   Chief Complaint Chief Complaint  Patient presents with  . Depression   The history is provided by the patient. No language interpreter was used.   HPI Comments: Danielle Prince is a 21 y.o. female with a PMHx of bipolar disorder, depression, and body dysmorphia who presents to the Emergency Department complaining of depression. Pt states she has been under stress due to her boyfriend moving out of the state. She has been off her anti-psychotics for the past few months. She denies any SI, HI, auditory/visual hallucinations, or other complaints.   Past Medical History:  Diagnosis Date  . ADHD (attention deficit hyperactivity disorder)   . ADHD (attention deficit hyperactivity disorder)   . Anorexia nervosa with bulimia   . Anxiety   . Aspirin overdose 02/2012  . Depression   . Dysrhythmia   . Eating disorder   . Electrolyte disturbance 2010, 2012   hypokalemia, hypochloremia.  . Urinary tract infection   . Varicella   . Vision abnormalities     Patient Active Problem List   Diagnosis Date Noted  . Dyspareunia 04/15/2014  . Esophagitis, acute 10/28/2013  . Candidiasis of vulva and vagina 08/28/2013  . Vaginitis and vulvovaginitis, unspecified 06/26/2013  . Generalized anxiety disorder 03/02/2012  . Oppositional defiant disorder 03/02/2012  . Bulimia nervosa 02/26/2012  . Recurrent major depression-severe (HCC) 02/26/2012  . Urinary tract infection   . Vision abnormalities     Past Surgical History:  Procedure Laterality Date  . COLONOSCOPY WITH ESOPHAGOGASTRODUODENOSCOPY (EGD) N/A 10/28/2013   Procedure: COLONOSCOPY WITH  ESOPHAGOGASTRODUODENOSCOPY (EGD);  Surgeon: Beverley FiedlerJay M Pyrtle, MD;  [facility];  Service: Endoscopy;  Laterality: N/A;  . GUM SURGERY    . INDUCED ABORTION      OB History    Gravida Para Term Preterm AB Living   1 0 0 0 1 0   SAB TAB Ectopic Multiple Live Births   0 1 0 0         Home Medications    Prior to Admission medications   Medication Sig Start Date End Date Taking? Authorizing Provider  FLUoxetine HCl (PROZAC PO) Take 20 mg by mouth daily.   Yes Historical Provider, MD  GABAPENTIN PO Take 300 mg by mouth daily as needed.   Yes Historical Provider, MD  Norgestimate-Ethinyl Estradiol Triphasic 0.18/0.215/0.25 MG-35 MCG tablet TAKE 1 TABLET DAILY 03/14/16  Yes Brock Badharles A Harper, MD    Family History Family History  Problem Relation Age of Onset  . Cancer Father   . Depression Sister   . Depression Paternal Aunt   . Drug abuse Maternal Uncle     deceased  . Drug abuse Paternal Aunt   . Colon cancer Maternal Grandmother     Social History Social History  Substance Use Topics  . Smoking status: Former Smoker    Packs/day: 0.50    Types: Cigarettes    Quit date: 06/25/2013  . Smokeless tobacco: Never Used  . Alcohol use 0.0 oz/week     Comment: occasioally    Allergies   Patient has no known allergies.   Review of Systems Review of Systems  Psychiatric/Behavioral: Positive for dysphoric mood. Negative for  hallucinations and suicidal ideas.  All other systems reviewed and are negative.  Physical Exam  Physical Exam  Constitutional: She is oriented to person, place, and time. Vital signs are normal. She appears well-developed and well-nourished. No distress.  HENT:  Head: Normocephalic and atraumatic.  Right Ear: Hearing normal.  Left Ear: Hearing normal.  Eyes: Conjunctivae and EOM are normal. Pupils are equal, round, and reactive to light.  Neck: Normal range of motion. Neck supple.  Cardiovascular: Normal rate, regular rhythm, normal heart  sounds and intact distal pulses.   Pulmonary/Chest: Effort normal and breath sounds normal.  Abdominal: Soft.  Musculoskeletal: Normal range of motion.  Neurological: She is alert and oriented to person, place, and time.  Skin: Skin is warm and dry.  Psychiatric: She has a normal mood and affect. Her speech is normal and behavior is normal. Thought content normal.  Nursing note and vitals reviewed.  ED Treatments / Results  DIAGNOSTIC STUDIES:  COORDINATION OF CARE: 12:06 AM- Pt advised of plan for treatment and pt agrees.  Labs (all labs ordered are listed, but only abnormal results are displayed) Labs Reviewed  COMPREHENSIVE METABOLIC PANEL - Abnormal; Notable for the following:       Result Value   Glucose, Bld 113 (*)    All other components within normal limits  ACETAMINOPHEN LEVEL - Abnormal; Notable for the following:    Acetaminophen (Tylenol), Serum <10 (*)    All other components within normal limits  ETHANOL  SALICYLATE LEVEL  CBC  RAPID URINE DRUG SCREEN, HOSP PERFORMED    EKG  EKG Interpretation None       Radiology No results found.  Procedures Procedures (including critical care time)  Medications Ordered in ED Medications - No data to display   Initial Impression / Assessment and Plan / ED Course  I have reviewed the triage vital signs and the nursing notes.  Pertinent labs & imaging results that were available during my care of the patient were reviewed by me and considered in my medical decision making (see chart for details).   Final Clinical Impressions(s) / ED Diagnoses  {I have reviewed and evaluated the relevant laboratory values.   {I have reviewed the relevant previous healthcare records.  {I obtained HPI from historian.   ED Course:  Assessment: Pt is a 20yF with hx bipolar disorder, depression, and body dysmorphia who presents due to depression. Pt has been off of medications x several months. Denies SI or HI. On exam, pt in NAD.  Nontoxic/nonseptic appearing. VSS observed in room. Afebrile. Lungs CTA. Heart RRR. Abdomen nontender soft. Labs unremarkable. Plan is to TTS consult. Otherwise medically cleared for evaluation.   Pt Acceptd by Dr. Jama Flavors as inpatient per TTS.   Disposition/Plan:  Inpatient BH Facility Pt acknowledges and agrees with plan  Supervising Physician April Palumbo, MD  Final diagnoses:  Depression, unspecified depression type    New Prescriptions New Prescriptions   No medications on file    I personally performed the services described in this documentation, which was scribed in my presence. The recorded information has been reviewed and is accurate.    Audry Pili, PA-C 02/09/17 1610    April Palumbo, MD 02/09/17 [phone]

## 2017-02-09 NOTE — [facility] Group Notes (Signed)
[facility] LCSW Group Therapy 02/09/2017 1:15 PM  Type of Therapy: Group Therapy- Emotion Regulation  Participation Level: Pt invited. Did not attend.   ,Jonathon JordanLynn B Raheem Kolbe, MSW, LCSWA 02/09/2017 2:18 PM

## 2017-02-09 NOTE — Progress Notes (Signed)
Recreation Therapy Notes  Date: 02/09/17 Time: 0930 Location: [address] Hall Group Room  Group Topic: Stress Management  Goal Area(s) Addresses:  Patient will verbalize importance of using healthy stress management.  Patient will identify positive emotions associated with healthy stress management.   Intervention: Stress Management  Activity :  Meditation.  LRT introduced the stress management technique of meditation.  LRT played a meditation from the Calm app to allows patients to engage and learn the benefits of meditation.  Patiens were to follow along as the meditation played to engage in the technique.  Education:  Stress Management, Discharge Planning.   Education Outcome: Acknowledges edcuation/In group clarification offered/Needs additional education  Clinical Observations/Feedback: Pt did not attend group.    Camarion Weier, LRT/CTRS         Victorine Mcnee A 02/09/2017 12:44 PM 

## 2017-02-09 NOTE — ED Provider Notes (Signed)
Medical screening examination/treatment/procedure(s) were performed by non-physician practitioner and as supervising physician I was immediately available for consultation/collaboration.   EKG Interpretation None        Karine Garn, MD 02/09/17 [phone]

## 2017-02-09 NOTE — Tx Team (Deleted)
Interdisciplinary Treatment and Diagnostic Plan Update 02/09/2017 Time of Session: 9:30am  Danielle Prince  MRN: [id number]  Principal Diagnosis:  Major Depression, Recurrent   Secondary Diagnoses: Active Problems:   MDD (major depressive disorder), recurrent episode ([place])   Current Medications:  Current Facility-Administered Medications  Medication Dose Route Frequency Provider Last Rate Last Dose  . acetaminophen (TYLENOL) tablet 650 mg  650 mg Oral Q6H PRN Laverle Hobby, PA-C      . alum & mag hydroxide-simeth (MAALOX/MYLANTA) 200-200-20 MG/5ML suspension 30 mL  30 mL Oral Q4H PRN Laverle Hobby, PA-C      . ARIPiprazole (ABILIFY) tablet 2 mg  2 mg Oral Daily Jenne Campus, MD   2 mg at 02/09/17 1156  . [START ON 02/10/2017] FLUoxetine (PROZAC) capsule 40 mg  40 mg Oral Daily Myer Peer Cobos, MD      . gabapentin (NEURONTIN) capsule 300 mg  300 mg Oral TID Laverle Hobby, PA-C   300 mg at 02/09/17 1156  . hydrOXYzine (ATARAX/VISTARIL) tablet 25 mg  25 mg Oral Q6H PRN Laverle Hobby, PA-C   25 mg at 02/09/17 0817  . magnesium hydroxide (MILK OF MAGNESIA) suspension 30 mL  30 mL Oral Daily PRN Laverle Hobby, PA-C      . Norgestimate-Ethinyl Estradiol Triphasic 0.18/0.215/0.25 MG-35 MCG tablet 1 tablet  1 tablet Oral Daily Laverle Hobby, PA-C      . traZODone (DESYREL) tablet 50 mg  50 mg Oral QHS,MR X 1 Spencer E Simon, PA-C        PTA Medications: Prescriptions Prior to Admission  Medication Sig Dispense Refill Last Dose  . FLUoxetine HCl (PROZAC PO) Take 20 mg by mouth daily.   02/08/2017 at Unknown time  . GABAPENTIN PO Take 300 mg by mouth daily as needed.   02/08/2017 at Unknown time  . Norgestimate-Ethinyl Estradiol Triphasic 0.18/0.215/0.25 MG-35 MCG tablet TAKE 1 TABLET DAILY 84 tablet 3 02/05/2017    Treatment Modalities: Medication Management, Group therapy, Case management,  1 to 1 session with clinician, Psychoeducation, Recreational therapy.  Patient  Stressors: Health problems Loss of Boyfriend (moved to another state) Patient Strengths: Average or above average intelligence Capable of independent living Curator fund of knowledge Motivation for treatment/growth Supportive family/friends  Physician Treatment Plan for Primary Diagnosis:  Major Depression, Recurrent  Long Term Goal(s): Improvement in symptoms so as ready for discharge Short Term Goals: Ability to verbalize feelings will improve Ability to disclose and discuss suicidal ideas Ability to demonstrate self-control will improve Ability to identify and develop effective coping behaviors will improve Ability to verbalize feelings will improve Ability to disclose and discuss suicidal ideas Ability to demonstrate self-control will improve Ability to identify and develop effective coping behaviors will improve Ability to maintain clinical measurements within normal limits will improve  Medication Management: Evaluate patient's response, side effects, and tolerance of medication regimen.  Therapeutic Interventions: 1 to 1 sessions, Unit Group sessions and Medication administration.  Evaluation of Outcomes: Not Met  Physician Treatment Plan for Secondary Diagnosis: Active Problems:   MDD (major depressive disorder), recurrent episode ([place])  Long Term Goal(s): Improvement in symptoms so as ready for discharge  Short Term Goals: Ability to verbalize feelings will improve Ability to disclose and discuss suicidal ideas Ability to demonstrate self-control will improve Ability to identify and develop effective coping behaviors will improve Ability to verbalize feelings will improve Ability to disclose and discuss suicidal ideas Ability to demonstrate self-control will improve  Ability to identify and develop effective coping behaviors will improve Ability to maintain clinical measurements within normal limits will improve  Medication Management: Evaluate  patient's response, side effects, and tolerance of medication regimen.  Therapeutic Interventions: 1 to 1 sessions, Unit Group sessions and Medication administration.  Evaluation of Outcomes: Not Met  RN Treatment Plan for Primary Diagnosis:  Major Depression, Recurrent  Long Term Goal(s): Knowledge of disease and therapeutic regimen to maintain health will improve  Short Term Goals: Ability to remain free from injury will improve, Ability to verbalize feelings will improve, Ability to identify and develop effective coping behaviors will improve and Compliance with prescribed medications will improve  Medication Management: RN will administer medications as ordered by provider, will assess and evaluate patient's response and provide education to patient for prescribed medication. RN will report any adverse and/or side effects to prescribing provider.  Therapeutic Interventions: 1 on 1 counseling sessions, Psychoeducation, Medication administration, Evaluate responses to treatment, Monitor vital signs and CBGs as ordered, Perform/monitor CIWA, COWS, AIMS and Fall Risk screenings as ordered, Perform wound care treatments as ordered.  Evaluation of Outcomes: Not Met  LCSW Treatment Plan for Primary Diagnosis:  Major Depression, Recurrent  Long Term Goal(s): Safe transition to appropriate next level of care at discharge, Engage patient in therapeutic group addressing interpersonal concerns. Short Term Goals: Engage patient in aftercare planning with referrals and resources, Increase emotional regulation, Identify triggers associated with mental health/substance abuse issues and Increase skills for wellness and recovery  Therapeutic Interventions: Assess for all discharge needs, 1 to 1 time with Social worker, Explore available resources and support systems, Assess for adequacy in community support network, Educate family and significant other(s) on suicide prevention, Complete Psychosocial  Assessment, Interpersonal group therapy.  Evaluation of Outcomes: Not Met  Progress in Treatment: Attending groups: Pt is new to milieu, continuing to assess  Participating in groups: Pt is new to milieu, continuing to assess  Taking medication as prescribed: Yes, MD continues to assess for medication changes as needed Toleration medication: Yes, no side effects reported at this time Family/Significant other contact made: No, CSW assessing for appropriate contact Patient understands diagnosis: Continuing to assess Discussing patient identified problems/goals with staff: Yes Medical problems stabilized or resolved: Yes Denies suicidal/homicidal ideation:  Issues/concerns per patient self-inventory: None Other: N/A  New problem(s) identified: None identified at this time.   New Short Term/Long Term Goal(s): None identified at this time.   Discharge Plan or Barriers:   Reason for Continuation of Hospitalization:   Depression Medication stabilization Suicidal ideation  Estimated Length of Stay: 3-5 days  Attendees: Patient: 02/09/2017 3:14 PM  Physician: Dr. Parke Poisson 02/09/2017 3:14 PM  Nursing: Sharl Ma RN; [place], RN 02/09/2017 3:14 PM  RN Care Manager: Lars Pinks, RN 02/09/2017 3:14 PM  Social Worker: Matthew Saras, [place] 02/09/2017 3:14 PM  Recreational Therapist:  02/09/2017 3:14 PM  Other: Lindell Spar, NP; Samuel Jester, NP 02/09/2017 3:14 PM  Other:  02/09/2017 3:14 PM  Other: 02/09/2017 3:14 PM   Scribe for Treatment Team: Georga Kaufmann, MSW,LCSWA 02/09/2017 3:14 PM

## 2017-02-09 NOTE — Progress Notes (Signed)
Admission Note:  D: Patient is a 21 year old female admitted to 400 hall due to depression and anxiety. On admission, was calm and cooperative. Presents with depressed/sad mood. Patient stated "I am going through depression and kind of anxiety. My boyfriend left to another state. It was kind of unexpected". Denies pain, SI/HI, AH/VH at this time. Endorses depression and increased anxiety. Denies history of abuse.   A: Skin/body search done. No contraband found. No tattoos, wound, bruises noted. POC and unit policies explained and understanding verbalized. Consents obtained. Food and fluids offered, fluids accepted.  R: Patient had no additional questions or concerns.

## 2017-02-09 NOTE — Tx Team (Signed)
Interdisciplinary Treatment and Diagnostic Plan Update 02/09/2017 Time of Session: 9:30am  Danielle Prince  MRN: [id number]  Principal Diagnosis: Major Depression, Recurrent   Secondary Diagnoses: Active Problems:   MDD (major depressive disorder), recurrent episode ([place])   Current Medications:  Current Facility-Administered Medications  Medication Dose Route Frequency Provider Last Rate Last Dose  . acetaminophen (TYLENOL) tablet 650 mg  650 mg Oral Q6H PRN Laverle Hobby, PA-C      . alum & mag hydroxide-simeth (MAALOX/MYLANTA) 200-200-20 MG/5ML suspension 30 mL  30 mL Oral Q4H PRN Laverle Hobby, PA-C      . ARIPiprazole (ABILIFY) tablet 2 mg  2 mg Oral Daily Jenne Campus, MD   2 mg at 02/09/17 1156  . [START ON 02/10/2017] FLUoxetine (PROZAC) capsule 40 mg  40 mg Oral Daily Myer Peer Cobos, MD      . gabapentin (NEURONTIN) capsule 300 mg  300 mg Oral TID Laverle Hobby, PA-C   300 mg at 02/09/17 1156  . hydrOXYzine (ATARAX/VISTARIL) tablet 25 mg  25 mg Oral Q6H PRN Laverle Hobby, PA-C   25 mg at 02/09/17 0817  . magnesium hydroxide (MILK OF MAGNESIA) suspension 30 mL  30 mL Oral Daily PRN Laverle Hobby, PA-C      . Norgestimate-Ethinyl Estradiol Triphasic 0.18/0.215/0.25 MG-35 MCG tablet 1 tablet  1 tablet Oral Daily Laverle Hobby, PA-C      . traZODone (DESYREL) tablet 50 mg  50 mg Oral QHS,MR X 1 Spencer E Simon, PA-C        PTA Medications: Prescriptions Prior to Admission  Medication Sig Dispense Refill Last Dose  . FLUoxetine HCl (PROZAC PO) Take 20 mg by mouth daily.   02/08/2017 at Unknown time  . GABAPENTIN PO Take 300 mg by mouth daily as needed.   02/08/2017 at Unknown time  . Norgestimate-Ethinyl Estradiol Triphasic 0.18/0.215/0.25 MG-35 MCG tablet TAKE 1 TABLET DAILY 84 tablet 3 02/05/2017    Treatment Modalities: Medication Management, Group therapy, Case management,  1 to 1 session with clinician, Psychoeducation, Recreational therapy.  Patient  Stressors: Health problems Loss of Boyfriend (moved to another state) Patient Strengths: Average or above average intelligence Capable of independent living Curator fund of knowledge Motivation for treatment/growth Supportive family/friends  Physician Treatment Plan for Primary Diagnosis: Major Depression, Recurrent  Long Term Goal(s): Improvement in symptoms so as ready for discharge Short Term Goals: Ability to verbalize feelings will improve Ability to disclose and discuss suicidal ideas Ability to demonstrate self-control will improve Ability to identify and develop effective coping behaviors will improve Ability to verbalize feelings will improve Ability to disclose and discuss suicidal ideas Ability to demonstrate self-control will improve Ability to identify and develop effective coping behaviors will improve Ability to maintain clinical measurements within normal limits will improve  Medication Management: Evaluate patient's response, side effects, and tolerance of medication regimen.  Therapeutic Interventions: 1 to 1 sessions, Unit Group sessions and Medication administration.  Evaluation of Outcomes: Not Met  Physician Treatment Plan for Secondary Diagnosis: Active Problems:   MDD (major depressive disorder), recurrent episode ([place])  Long Term Goal(s): Improvement in symptoms so as ready for discharge  Short Term Goals: Ability to verbalize feelings will improve Ability to disclose and discuss suicidal ideas Ability to demonstrate self-control will improve Ability to identify and develop effective coping behaviors will improve Ability to verbalize feelings will improve Ability to disclose and discuss suicidal ideas Ability to demonstrate self-control will improve Ability to  identify and develop effective coping behaviors will improve Ability to maintain clinical measurements within normal limits will improve  Medication Management: Evaluate  patient's response, side effects, and tolerance of medication regimen.  Therapeutic Interventions: 1 to 1 sessions, Unit Group sessions and Medication administration.  Evaluation of Outcomes: Not Met  RN Treatment Plan for Primary Diagnosis: Major Depression, Recurrent  Long Term Goal(s): Knowledge of disease and therapeutic regimen to maintain health will improve  Short Term Goals: Ability to remain free from injury will improve, Ability to verbalize feelings will improve, Ability to disclose and discuss suicidal ideas and Compliance with prescribed medications will improve  Medication Management: RN will administer medications as ordered by provider, will assess and evaluate patient's response and provide education to patient for prescribed medication. RN will report any adverse and/or side effects to prescribing provider.  Therapeutic Interventions: 1 on 1 counseling sessions, Psychoeducation, Medication administration, Evaluate responses to treatment, Monitor vital signs and CBGs as ordered, Perform/monitor CIWA, COWS, AIMS and Fall Risk screenings as ordered, Perform wound care treatments as ordered.  Evaluation of Outcomes: Not Met  LCSW Treatment Plan for Primary Diagnosis: Major Depression, Recurrent  Long Term Goal(s): Safe transition to appropriate next level of care at discharge, Engage patient in therapeutic group addressing interpersonal concerns. Short Term Goals: Engage patient in aftercare planning with referrals and resources, Facilitate patient progression through stages of change regarding substance use diagnoses and concerns, Identify triggers associated with mental health/substance abuse issues and Increase skills for wellness and recovery  Therapeutic Interventions: Assess for all discharge needs, 1 to 1 time with Social worker, Explore available resources and support systems, Assess for adequacy in community support network, Educate family and significant other(s) on  suicide prevention, Complete Psychosocial Assessment, Interpersonal group therapy.  Evaluation of Outcomes: Not Met  Progress in Treatment: Attending groups: Pt is new to milieu, continuing to assess  Participating in groups: Pt is new to milieu, continuing to assess  Taking medication as prescribed: Yes, MD continues to assess for medication changes as needed Toleration medication: Yes, no side effects reported at this time Family/Significant other contact made: No, CSW assessing for appropriate contact Patient understands diagnosis: Continuing to assess Discussing patient identified problems/goals with staff: Yes Medical problems stabilized or resolved: Yes Denies suicidal/homicidal ideation:  Issues/concerns per patient self-inventory: None Other: N/A  New problem(s) identified: None identified at this time.   New Short Term/Long Term Goal(s): None identified at this time.   Discharge Plan or Barriers: Pt will return home and follow up outpatient.  Reason for Continuation of Hospitalization:  Depression Medication stabilization Suicidal ideation  Estimated Length of Stay: 3-5 days  Attendees: Patient: 02/09/2017 1:00 PM  Physician: Dr. Parke Poisson 02/09/2017 1:00 PM  Nursing: Sharl Ma RN; [place], RN 02/09/2017 1:00 PM  RN Care Manager: Lars Pinks, RN 02/09/2017 1:00 PM  Social Worker: Matthew Saras, [place] 02/09/2017 1:00 PM  Recreational Therapist:  02/09/2017 1:00 PM  Other: Lindell Spar, NP; Samuel Jester, NP 02/09/2017 1:00 PM  Other:  02/09/2017 1:00 PM  Other: 02/09/2017 1:00 PM   Scribe for Treatment Team: Georga Kaufmann, MSW,LCSWA 02/09/2017 1:00 PM

## 2017-02-09 NOTE — ED Notes (Signed)
TTS at bedside. 

## 2017-02-09 NOTE — BH Assessment (Addendum)
Tele Assessment Note   Nyhla Prince is an 21 y.o. single female who presents unaccompanied to [facility] ED reporting mood swings and suicidal ideation. Pt has a history of bipolar disorder and has been off psychiatric medication for over six months and does not have an explanation why. She reports for the past several weeks she has experienced increasing mood swing and symptoms of depression and mania. She reports racing thoughts and frequent anger outbursts. Pt describes her anger as "out of control" and states she had "a breakdown" today. Pt reports symptoms including crying spells, social withdrawal, loss of interest in usual pleasures, fatigue, irritability, decreased concentration, decreased appetite and feelings of guilt and hopelessness. Pt reports she either can't sleep or she sleeps excessively. Pt reports she is staying in bed. She reports current suicidal ideation with no intent, She has a history of a suicide attempt in 2013 by overdosing on aspirin. She reports her mother has a history of a suicide attempt. Pt denies homicidal ideation. She says she can be violent when angry and says she does not assault people but hits objects. She denies any history of auditory or visual hallucinations. Pt reports she smokes approximately one bowl of marijuana 2-3 times per week. She denies other substance use.  Pt identifies her primary stressor as her boyfriend of one year moving to [place]. Pt says she found out he was moving four days ago and believes this is the end of their relationship. She says he is moving "because of my mania." Pt says she is currently living with her mother, which Pt describes as stressful. She identifies her mother and sister as her primary support. She says she works part-time at [employer]. Pt has no current outpatient mental health providers. She was psychiatrically hospitalized in 2013 at [facility] and 2012 at [facility] for mood disorder and suicidal ideation.   Pt  is dressed in hospital scrubs, drowsy, oriented x4 with normal speech and normal motor behavior. Eye contact is good. Pt's mood is depressed and affect is congruent with mood. Thought process is coherent and relevant. There is no indication Pt is currently responding to internal stimuli or experiencing delusional thought content. Pt was cooperative throughout assessment. Pt says that due to the severity of her current anger and depression she doesn't feel safe going home at this time and believes she needs to be admitted to a psychiatric facility for crisis stabilization and to resume psychiatric medications.   Diagnosis: Bipolar I Disorder, Current Episode Mixed  Past Medical History:  Past Medical History:  Diagnosis Date  . ADHD (attention deficit hyperactivity disorder)   . ADHD (attention deficit hyperactivity disorder)   . Anorexia nervosa with bulimia   . Anxiety   . Aspirin overdose 02/2012  . Depression   . Dysrhythmia   . Eating disorder   . Electrolyte disturbance 2010, 2012   hypokalemia, hypochloremia.  . Urinary tract infection   . Varicella   . Vision abnormalities     Past Surgical History:  Procedure Laterality Date  . COLONOSCOPY WITH ESOPHAGOGASTRODUODENOSCOPY (EGD) N/A 10/28/2013   Procedure: COLONOSCOPY WITH ESOPHAGOGASTRODUODENOSCOPY (EGD);  Surgeon: Beverley Fiedler, MD;  Location: [facility] ENDOSCOPY;  Service: Endoscopy;  Laterality: N/A;  . GUM SURGERY    . INDUCED ABORTION      Family History:  Family History  Problem Relation Age of Onset  . Cancer Father   . Depression Sister   . Depression Paternal Aunt   . Drug abuse  Maternal Uncle     deceased  . Drug abuse Paternal Aunt   . Colon cancer Maternal Grandmother     Social History:  reports that she quit smoking about 3 years ago. Her smoking use included Cigarettes. She smoked 0.50 packs per day. She has never used smokeless tobacco. She reports that she drinks alcohol. She reports that she does not use  drugs.  Additional Social History:  Alcohol / Drug Use Pain Medications: Pt denies use Prescriptions: Pt denies current medications Over the Counter: Pt denies use History of alcohol / drug use?: Yes Longest period of sobriety (when/how long): Unknown Negative Consequences of Use:  (Pt denies) Withdrawal Symptoms:  (Pt denies) Substance #1 Name of Substance 1: Marijuana 1 - Age of First Use: Adolescent 1 - Amount (size/oz): One bowl 1 - Frequency: 2-3 times per week 1 - Duration: Ongoing 1 - Last Use / Amount: 02/06/17  CIWA:   COWS:    PATIENT STRENGTHS: (choose at least two) Ability for insight Average or above average intelligence Capable of independent living Communication skills Financial means General fund of knowledge Motivation for treatment/growth Physical Health Supportive family/friends  Allergies: No Known Allergies  Home Medications:  (Not in a hospital admission)  OB/GYN Status:  No LMP recorded.  General Assessment Data Location of Assessment: [facility] ED TTS Assessment: In system Is this a Tele or Face-to-Face Assessment?: Face-to-Face Is this an Initial Assessment or a Re-assessment for this encounter?: Initial Assessment Marital status: Single Maiden name: NA Is patient pregnant?: No Pregnancy Status: No Living Arrangements: Parent (Living with mother) Can pt return to current living arrangement?: Yes Admission Status: Voluntary Is patient capable of signing voluntary admission?: Yes Referral Source: Self/Family/Friend Insurance type: BCBS     Crisis Care Plan Living Arrangements: Parent (Living with mother) Legal Guardian: Other: (Self) Name of Psychiatrist: None Name of Therapist: None  Education Status Is patient currently in school?: No Current Grade: NA Highest grade of school patient has completed: Some college Name of school: NA Contact person: NA  Risk to self with the past 6 months Suicidal Ideation: Yes-Currently  Present Has patient been a risk to self within the past 6 months prior to admission? : Yes Suicidal Intent: No Has patient had any suicidal intent within the past 6 months prior to admission? : No Is patient at risk for suicide?: Yes Suicidal Plan?: No Has patient had any suicidal plan within the past 6 months prior to admission? : No Access to Means: No What has been your use of drugs/alcohol within the last 12 months?: Pt reports using marijuana 2-3 times per week Previous Attempts/Gestures: Yes How many times?: 1 Other Self Harm Risks: None Triggers for Past Attempts: Other personal contacts Intentional Self Injurious Behavior: None Family Suicide History: Yes (Mother has history of suicide attempt) Recent stressful life event(s): Conflict (Comment), Loss (Comment) (Boyfriend breaking up with her) Persecutory voices/beliefs?: No Depression: Yes Depression Symptoms: Despondent, Insomnia, Tearfulness, Isolating, Fatigue, Guilt, Loss of interest in usual pleasures, Feeling worthless/self pity, Feeling angry/irritable Substance abuse history and/or treatment for substance abuse?: Yes Suicide prevention information given to non-admitted patients: Not applicable  Risk to Others within the past 6 months Homicidal Ideation: No Does patient have any lifetime risk of violence toward others beyond the six months prior to admission? : No Thoughts of Harm to Others: No Current Homicidal Intent: No Current Homicidal Plan: No Access to Homicidal Means: No Identified Victim: None History of harm to others?: No Assessment of  Violence: In past 6-12 months Violent Behavior Description: Pt reports she hits objects when angry Does patient have access to weapons?: No Criminal Charges Pending?: No Does patient have a court date: No Is patient on probation?: No  Psychosis Hallucinations: None noted Delusions: None noted  Mental Status Report Appearance/Hygiene: In scrubs Eye Contact:  Fair Motor Activity: Unremarkable Speech: Logical/coherent Level of Consciousness: Drowsy Mood: Depressed Affect: Depressed Anxiety Level: None Thought Processes: Coherent, Relevant Judgement: Unimpaired Orientation: Person, Place, Time, Situation, Appropriate for developmental age Obsessive Compulsive Thoughts/Behaviors: None  Cognitive Functioning Concentration: Normal Memory: Recent Intact, Remote Intact IQ: Average Insight: Fair Impulse Control: Poor Appetite: Fair Weight Loss: 10 (Pt reports she was trying to lose weight) Weight Gain: 0 Sleep: Decreased Total Hours of Sleep: 4 (Pt reports either little sleep or excessive sleep) Vegetative Symptoms: Staying in bed  ADLScreening [facility] Assessment Services) Patient's cognitive ability adequate to safely complete daily activities?: Yes Patient able to express need for assistance with ADLs?: Yes Independently performs ADLs?: Yes (appropriate for developmental age)  Prior Inpatient Therapy Prior Inpatient Therapy: Yes Prior Therapy Dates: 2013, 2012 Prior Therapy Facilty/Provider(s): [facility], [facility] Reason for Treatment: Mood disorder  Prior Outpatient Therapy Prior Outpatient Therapy: Yes Prior Therapy Dates: 2012-2015 Prior Therapy Facilty/Provider(s): Unknown Reason for Treatment: Bipolar disorder Does patient have an ACCT team?: No Does patient have Intensive In-House Services?  : No Does patient have [facility] services? : No Does patient have P4CC services?: No  ADL Screening (condition at time of admission) Patient's cognitive ability adequate to safely complete daily activities?: Yes Is the patient deaf or have difficulty hearing?: No Does the patient have difficulty seeing, even when wearing glasses/contacts?: No Does the patient have difficulty concentrating, remembering, or making decisions?: No Patient able to express need for assistance with ADLs?: Yes Does the patient have difficulty dressing or  bathing?: No Independently performs ADLs?: Yes (appropriate for developmental age) Does the patient have difficulty walking or climbing stairs?: No Weakness of Legs: None Weakness of Arms/Hands: None  Home Assistive Devices/Equipment Home Assistive Devices/Equipment: None    Abuse/Neglect Assessment (Assessment to be complete while patient is alone) Physical Abuse: Denies Verbal Abuse: Denies Sexual Abuse: Denies Exploitation of patient/patient's resources: Denies Self-Neglect: Denies     Merchant navy officer (For Healthcare) Does Patient Have a Medical Advance Directive?: No Would patient like information on creating a medical advance directive?: No - Patient declined    Additional Information 1:1 In Past 12 Months?: No CIRT Risk: No Elopement Risk: No Does patient have medical clearance?: Yes     Disposition: Clint Bolder, AC at [facility], confirmed bed availability. Gave clinical report to Donell Sievert, PA who said Pt meets criteria for inpatient psychiatric treatment and accepted Pt to the service of Dr. Carmon Ginsberg. Cobos, room 400-2. Notified Audry Pili, PA-C and Dayna Barker, RN of acceptance.  Disposition Initial Assessment Completed for this Encounter: Yes Disposition of Patient: Inpatient treatment program Type of inpatient treatment program: Adult   Pamalee Leyden, [facility], [facility], [facility] Triage Specialist [phone]   Pamalee Leyden 02/09/2017 12:47 AM

## 2017-02-09 NOTE — H&P (Signed)
Psychiatric Admission Assessment Adult  Patient Identification: Danielle Prince MRN:  [id number] Date of Evaluation:  02/09/2017 Chief Complaint:   " I just can't get over it " ( refers to recent relationship stressors) Principal Diagnosis: Major Depression, Recurrent  Diagnosis:   Patient Active Problem List   Diagnosis Date Noted  . MDD (major depressive disorder), recurrent episode ([place]) [F33.9] 02/09/2017  . Dyspareunia [IMO0002] 04/15/2014  . Esophagitis, acute [K20.9] 10/28/2013  . Candidiasis of vulva and vagina [B37.3] 08/28/2013  . Vaginitis and vulvovaginitis, unspecified [N76.0] 06/26/2013  . Generalized anxiety disorder [F41.1] 03/02/2012  . Oppositional defiant disorder [F91.3] 03/02/2012  . Bulimia nervosa [F50.2] 02/26/2012  . Recurrent major depression-severe ([place]) [F33.2] 02/26/2012  . Urinary tract infection [N39.0]   . Vision abnormalities [H53.9]    History of Present Illness: 21 year old female. Reports she has been feeling depressed for months, recently worsened by relationship stressors. States her boyfriend told her he was going out of state to get a job and would have her move with him once he had the money " but when he got there he changed his number". She endorses neuro-vegetative symptoms of depression as below, and endorses passive SI," like not caring if I die", but denies suicidal plan or intention. Also denies any psychotic symptoms. 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  Of note, patient endorses history of mood disorder, depression, and states she had been prescribed medications in the past but had not been taking any psychiatric medications in three years. Recently restarted Prozac, Neurontin about two weeks ago, from an old prescription she had .  Associated Signs/Symptoms: Depression Symptoms:  depressed  mood, anhedonia, insomnia, suicidal thoughts without plan, loss of energy/fatigue, decreased appetite, 20 lb weight loss over recent weeks/ low sense of self esteem (Hypo) Manic Symptoms:  Denies  Anxiety Symptoms:   Reports frequent panic attacks, and describes some degree of agoraphobia Psychotic Symptoms:  Denies  PTSD Symptoms: Does not endorse current symptoms of PTSD . Total Time spent with patient: 45 minutes  Past Psychiatric History: has had 3  prior psychiatric admissions , last time at age 78, for depression and suicidal ideations. A prior admission was for Bulimia.  She has a remote history of self cutting , but not in several years. Has never attempted suicide. Denies history of psychosis. States she has been diagnosed with Bipolar Disorder, but at this time does not endorse any clear history of mania, hypomania, she describes intermittent explosiveness of short duration and episodes of increased ruminations about her bodily appearance.    Is the patient at risk to self? Yes.    Has the patient been a risk to self in the past 6 months? Yes.    Has the patient been a risk to self within the distant past? No.  Is the patient a risk to others? No.  Has the patient been a risk to others in the past 6 months? No.  Has the patient been a risk to others within the distant past? No.   Prior Inpatient Therapy:  as above  Prior Outpatient Therapy:  currently not in therapy, last time 2-3 years ago.  Alcohol Screening: Patient refused Alcohol Screening Tool: Yes 1. How often do you have a drink containing alcohol?: Never 9. Have you or someone else been injured as a result of your drinking?: No 10. 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 Does not remember what medications she took in the past, states " I never took anything consistently".  Psychological Evaluations:  No  Past Medical History: Denies any medical illnesses , NKDA, smokes occasionally ( 2 cigarettes per week)  Past Medical History:  Diagnosis Date  . ADHD (attention deficit hyperactivity disorder)   . ADHD (attention deficit hyperactivity disorder)   . Anorexia nervosa with bulimia   . Anxiety   . Aspirin overdose 02/2012  . Depression   . Dysrhythmia   . Eating disorder   . Electrolyte disturbance 2010, 2012   hypokalemia, hypochloremia.  . Urinary tract infection   . Varicella   . Vision abnormalities     Past Surgical History:  Procedure Laterality Date  . COLONOSCOPY WITH ESOPHAGOGASTRODUODENOSCOPY (EGD) N/A 10/28/2013   Procedure: COLONOSCOPY WITH ESOPHAGOGASTRODUODENOSCOPY (EGD);  Surgeon: Jerene Bears, MD;  Location: [facility];  Service: Endoscopy;  Laterality: N/A;  . GUM SURGERY    . INDUCED ABORTION     Family History:  Father died from cancer in January 28, 2013, has two sisters Family History  Problem Relation Age of Onset  . Cancer Father   . Depression Sister   . Depression Paternal Aunt   . Drug abuse Maternal Uncle     deceased  . Drug abuse Paternal Aunt   . Colon cancer Maternal Grandmother    Family Psychiatric  History: states there is a strong history of depression in the family . States sister has history of polysubstance dependence, no suicides in family  Tobacco Screening: Have you used any form of tobacco in  the last 30 days? (Cigarettes, Smokeless Tobacco, Cigars, and/or Pipes): No Social History: single, no children, lives with mother, reports they have good relationship, had a temporary part time job recently, but states she has generally been unemployed for a period of time before then, denies legal issues, partial college , but had been taking time off to travel across the [place],  History  Alcohol Use  . 0.0 oz/week    Comment: occasioally      History  Drug Use No    Comment: 3x a week    Additional Social History:  Allergies:  No Known Allergies Lab Results:  Results for orders placed or performed during the hospital encounter of 02/08/17 (from the past 48 hour(s))  Comprehensive metabolic panel     Status: Abnormal   Collection Time: 02/08/17 10:27 PM  Result Value Ref Range   Sodium 138 135 - 145 mmol/L   Potassium 3.9 3.5 - 5.1 mmol/L   Chloride 106 101 - 111 mmol/L   CO2 24 22 - 32 mmol/L   Glucose, Bld 113 (H) 65 - 99 mg/dL   BUN 10 6 - 20 mg/dL   Creatinine, Ser 0.83 0.44 - 1.00 mg/dL   Calcium 9.7 8.9 -  10.3 mg/dL   Total Protein 7.8 6.5 - 8.1 g/dL   Albumin 4.2 3.5 - 5.0 g/dL   AST 20 15 - 41 U/L   ALT 14 14 - 54 U/L   Alkaline Phosphatase 61 38 - 126 U/L   Total Bilirubin 0.8 0.3 - 1.2 mg/dL   GFR calc non Af Amer >60 >60 mL/min   GFR calc Af Amer >60 >60 mL/min    Comment: (NOTE) The eGFR has been calculated using the CKD EPI equation. This calculation has not been validated in all clinical situations. eGFR's persistently <60 mL/min signify possible Chronic Kidney Disease.    Anion gap 8 5 - 15  Ethanol     Status: None   Collection Time: 02/08/17 10:27 PM  Result Value Ref Range   Alcohol, Ethyl (B) <5 <5 mg/dL    Comment:        LOWEST DETECTABLE LIMIT FOR SERUM ALCOHOL IS 5 mg/dL FOR MEDICAL PURPOSES ONLY   Salicylate level     Status: None   Collection Time: 02/08/17 10:27 PM  Result Value Ref Range   Salicylate Lvl <3.7 2.8 - 30.0 mg/dL   Acetaminophen level     Status: Abnormal   Collection Time: 02/08/17 10:27 PM  Result Value Ref Range   Acetaminophen (Tylenol), Serum <10 (L) 10 - 30 ug/mL    Comment:        THERAPEUTIC CONCENTRATIONS VARY SIGNIFICANTLY. A RANGE OF 10-30 ug/mL MAY BE AN EFFECTIVE CONCENTRATION FOR MANY PATIENTS. HOWEVER, SOME ARE BEST TREATED AT CONCENTRATIONS OUTSIDE THIS RANGE. ACETAMINOPHEN CONCENTRATIONS >150 ug/mL AT 4 HOURS AFTER INGESTION AND >50 ug/mL AT 12 HOURS AFTER INGESTION ARE OFTEN ASSOCIATED WITH TOXIC REACTIONS.   cbc     Status: None   Collection Time: 02/08/17 10:27 PM  Result Value Ref Range   WBC 10.0 4.0 - 10.5 K/uL   RBC 4.85 3.87 - 5.11 MIL/uL   Hemoglobin 14.4 12.0 - 15.0 g/dL   HCT 41.3 36.0 - 46.0 %   MCV 85.2 78.0 - 100.0 fL   MCH 29.7 26.0 - 34.0 pg   MCHC 34.9 30.0 - 36.0 g/dL   RDW 12.7 11.5 - 15.5 %   Platelets 264 150 - 400 K/uL    Blood Alcohol level:  Lab Results  Component Value Date   ETH <5 02/08/2017   ETH <11 34/28/7681    Metabolic Disorder Labs:  No results found for: HGBA1C, MPG No results found for: PROLACTIN No results found for: CHOL, TRIG, HDL, CHOLHDL, VLDL, LDLCALC  Current Medications: Current Facility-Administered Medications  Medication Dose Route Frequency Provider Last Rate Last Dose  . acetaminophen (TYLENOL) tablet 650 mg  650 mg Oral Q6H PRN Laverle Hobby, PA-C      . alum & mag hydroxide-simeth (MAALOX/MYLANTA) 200-200-20 MG/5ML suspension 30 mL  30 mL Oral Q4H PRN Laverle Hobby, PA-C      . FLUoxetine (PROZAC) capsule 20 mg  20 mg Oral Daily Laverle Hobby, PA-C   20 mg at 02/09/17 0813  . gabapentin (NEURONTIN) capsule 300 mg  300 mg Oral TID Laverle Hobby, PA-C   300 mg at 02/09/17 0813  . hydrOXYzine (ATARAX/VISTARIL) tablet 25 mg  25 mg Oral Q6H PRN Laverle Hobby, PA-C   25 mg at 02/09/17 0817  . magnesium hydroxide (MILK OF MAGNESIA) suspension 30 mL  30 mL Oral Daily PRN Laverle Hobby, PA-C      .  Norgestimate-Ethinyl Estradiol Triphasic 0.18/0.215/0.25  MG-35 MCG tablet 1 tablet  1 tablet Oral Daily Laverle Hobby, PA-C      . traZODone (DESYREL) tablet 50 mg  50 mg Oral QHS,MR X 1 Laverle Hobby, PA-C       PTA Medications: Prescriptions Prior to Admission  Medication Sig Dispense Refill Last Dose  . FLUoxetine HCl (PROZAC PO) Take 20 mg by mouth daily.   02/08/2017 at Unknown time  . GABAPENTIN PO Take 300 mg by mouth daily as needed.   02/08/2017 at Unknown time  . Norgestimate-Ethinyl Estradiol Triphasic 0.18/0.215/0.25 MG-35 MCG tablet TAKE 1 TABLET DAILY 84 tablet 3 02/05/2017    Musculoskeletal: Strength & Muscle Tone: within normal limits Gait & Station: normal Patient leans: N/A  Psychiatric Specialty Exam: Physical Exam  Review of Systems  Constitutional: Negative.   HENT: Negative.   Eyes: Negative.   Respiratory: Negative.   Cardiovascular: Negative.   Gastrointestinal: Negative.   Genitourinary: Negative.   Musculoskeletal: Negative.   Skin: Negative.   Neurological: Negative for seizures.  Endo/Heme/Allergies: Negative.   Psychiatric/Behavioral: Positive for depression, substance abuse and suicidal ideas. The patient is nervous/anxious.   All other systems reviewed and are negative.   Blood pressure 90/68, pulse (!) 136, temperature 98.6 F (37 C), temperature source Oral, resp. rate 20, height 5' 10" (1.778 m), weight 66.8 kg (147 lb 3.2 oz), SpO2 99 %.Body mass index is 21.12 kg/m.  General Appearance: Fairly Groomed  Eye Contact:  Good  Speech:  Normal Rate  Volume:  Decreased  Mood:  Depressed  Affect:  constricted, tearful  Thought Process:  Linear  Orientation:  Full (Time, Place, and Person)  Thought Content:  denies hallucinations, no delusions, not internally preoccupied, ruminates about relationship stressors  Suicidal Thoughts:  denies any current suicidal or self injurious ideations, and contracts for safety on the unit   Homicidal  Thoughts:  No denies any violent or homicidal ideations, and specifically denies any violent or homicidal ideations towards BF   Memory:  recent and remote grossly intact   Judgement:  Fair  Insight:  Fair  Psychomotor Activity:  Decreased  Concentration:  Concentration: Good and Attention Span: Good  Recall:  Good  Fund of Knowledge:  Good  Language:  Good  Akathisia:  Negative  Handed:  Right  AIMS (if indicated):     Assets:  Communication Skills Desire for Improvement Resilience  ADL's:  Intact  Cognition:  WNL  Sleep:  Number of Hours: 2.5    Treatment Plan Summary: Daily contact with patient to assess and evaluate symptoms and progress in treatment, Medication management, Plan inpatient admission and medications as below  Observation Level/Precautions:  15 minute checks  Laboratory:  as needed  TSH, Urine Pregnancy Test   Psychotherapy:  Milieu, support   Medications:  We discussed options, patient feels Prozac may be helping  " a little" and is wanting to see if she responds to higher doses . Increase to 40 mgrs QDAY. She states Neurontin helps her anxiety, continue Neurontin 300 mgrs TID Start Abilify 2 mgrs QDAY initially as augmentation   Consultations:  As needed  Discharge Concerns: -   Estimated LOS: 5-6 days   Other:     Physician Treatment Plan for Primary Diagnosis: Major Depression, Recurrent, Severe, no Psychotic Features  Long Term Goal(s): Improvement in symptoms so as ready for discharge  Short Term Goals: Ability to verbalize feelings will improve, Ability to disclose and discuss suicidal ideas, Ability to demonstrate self-control will  improve and Ability to identify and develop effective coping behaviors will improve  Physician Treatment Plan for Secondary Diagnosis: Active Problems:   MDD (major depressive disorder), recurrent episode ([place])  Long Term Goal(s): Improvement in symptoms so as ready for discharge  Short Term Goals: Ability to verbalize  feelings will improve, Ability to disclose and discuss suicidal ideas, Ability to demonstrate self-control will improve, Ability to identify and develop effective coping behaviors will improve and Ability to maintain clinical measurements within normal limits will improve  I certify that inpatient services furnished can reasonably be expected to improve the patient's condition.    Neita Garnet, MD 2/21/20188:50 AM

## 2017-02-09 NOTE — [facility] Suicide Risk Assessment (Signed)
[facility]   Nursing information obtained from:   patient and chart  Demographic factors:   21 year old single female Current Mental Status:   see below  Loss Factors:   relationship stressors, lack of steady employment  Historical Factors:   history of prior psychiatric admissions, history of depression, history of eating disorder , history of cannabis abuse  Risk Reduction Factors:   resilience, physical health  Total Time spent with patient: 45 minutes Principal Problem:  Major Depression, Recurrent, Severe, No Psychotic features  Diagnosis:   Patient Active Problem List   Diagnosis Date Noted  . MDD (major depressive disorder), recurrent episode ([facility]) [F33.9] 02/09/2017  . Dyspareunia [IMO0002] 04/15/2014  . Esophagitis, acute [K20.9] 10/28/2013  . Candidiasis of vulva and vagina [B37.3] 08/28/2013  . Vaginitis and vulvovaginitis, unspecified [N76.0] 06/26/2013  . Generalized anxiety disorder [F41.1] 03/02/2012  . Oppositional defiant disorder [F91.3] 03/02/2012  . Bulimia nervosa [F50.2] 02/26/2012  . Recurrent major depression-severe ([facility]) [F33.2] 02/26/2012  . Urinary tract infection [N39.0]   . Vision abnormalities [H53.9]     Continued Clinical Symptoms:  Alcohol Use Disorder Identification Test Final Score (AUDIT): 0 The "Alcohol Use Disorders Identification Test", Guidelines for Use in Primary Care, Second Edition.  World [employer] [facility](WHO). Score between 0-7:  no or low risk or alcohol related problems. Score between 8-15:  moderate risk of alcohol related problems. Score between 16-19:  high risk of alcohol related problems. Score 20 or above:  warrants further diagnostic evaluation for alcohol dependence and treatment.   CLINICAL FACTORS:  21 year old single female, reports long history of depression. Also reports history of eating disorder, which she describes as bulimia. She reports prior diagnosis of Bipolar Disorder, but  currently does not endorse clear history of mania, hypomania, but rather of short lived mood instability and angry outbursts. Presents severely depressed, with passive SI, in the context of recent separation from BF and feeling lied to by him.  Psychiatric Specialty Exam: Physical Exam  ROS  Blood pressure 90/68, pulse (!) 108, temperature 98.6 F (37 C), temperature source Oral, resp. rate 20, height 5\' 10"  (1.778 m), weight 66.8 kg (147 lb 3.2 oz), SpO2 99 %.Body mass index is 21.12 kg/m.   see admit note MSE                                                         COGNITIVE FEATURES THAT CONTRIBUTE TO RISK:  Closed-mindedness and Loss of executive function    SUICIDE RISK:   Moderate:  Frequent suicidal ideation with limited intensity, and duration, some specificity in terms of plans, no associated intent, good self-control, limited dysphoria/symptomatology, some risk factors present, and identifiable protective factors, including available and accessible social support.  PLAN OF CARE: Patient will be admitted to inpatient psychiatric unit for stabilization and safety. Will provide and encourage milieu participation. Provide medication management and maked adjustments as needed.  Will follow daily.    I certify that inpatient services furnished can reasonably be expected to improve the patient's condition.   Nehemiah MassedOBOS, Bree Heinzelman, MD 02/09/2017, 10:46 AM

## 2017-02-09 NOTE — Progress Notes (Signed)
Patient ID: Danielle Prince, female   DOB: May 05, 1996, 21 y.o.   MRN: [id number]  Patient signed a 72 hour request for discharge 2/21 at 0820. Patient was asked to provide a urine specimen and patient verbalized understanding however no specimen has been produced.

## 2017-02-09 NOTE — Progress Notes (Signed)
Patient ID: Danielle Prince, female   DOB: 08-25-1996, 21 y.o.   MRN: [id number]  Patient was given a pitcher of ice water and encouraged to push fluids. Patient verbalized understanding.

## 2017-02-09 NOTE — ED Notes (Signed)
PA at bedside.

## 2017-02-09 NOTE — Tx Team (Signed)
Initial Treatment Plan 02/09/2017 4:32 AM Danielle Prince [id number]    PATIENT STRESSORS: Health problems Loss of Boyfriend (moved to another state)   PATIENT STRENGTHS: Average or above average intelligence Capable of independent living Communication skills General fund of knowledge Motivation for treatment/growth Supportive family/friends   PATIENT IDENTIFIED PROBLEMS: Depression  Anxiety                   DISCHARGE CRITERIA:  Improved stabilization in mood, thinking, and/or behavior Motivation to continue treatment in a less acute level of care Verbal commitment to aftercare and medication compliance  PRELIMINARY DISCHARGE PLAN: Outpatient therapy Return to previous living arrangement Return to previous work or school arrangements  PATIENT/FAMILY INVOLVEMENT: This treatment plan has been presented to and reviewed with the patient, Danielle Prince, and/or family member. The patient and family have been given the opportunity to ask questions and make suggestions.  Earleen NewportIbekwe, Narciso Stoutenburg B, RN 02/09/2017, 4:32 AM

## 2017-02-09 NOTE — Progress Notes (Signed)
Patient ID: Danielle Prince, Danielle Mcwhirt[birth year], 21 y.o.   MRN: [id number]  DAR: Pt. Denies SI/HI and A/V Hallucinations. However, she is experiencing crying spells this evening and reports that she would like to leave early. Writer spoke to patient one on one about safety and making sure patient is able to be safe upon discharge. Patient spoke extensively about her boyfriend who left her and reported that she felt like there were no problems in the relationship. However, the more patient spoke about her relationship the more she verbalized that they had difficulty in their relationship. She reports they both suffer from Bipolar disorder and she feels like she should have gotten help with her mental illness earlier. She also reports he "tore up" her care, drained her savings, and stayed at her mom's house. "We helped him so much. I feel stupid." Patient cries intermittently when speaking through this. She reports that she is bored here and she ruminates on where he is and if he will call her and why he left. Writer challenged patient to stay in the milieu at least a few hours to see if she notices a difference. Patient states, "okay. I have some visitors coming later." Patient up until this point has been minimal in the milieu, mostly seen laying in her bed. She has not yet produced a urine specimen although she has been prompted to do so. Patient refused her self inventory sheet today. Patient does not report any pain or discomfort at this time. Support and encouragement provided to the patient. Scheduled medications administered to patient per physician's orders. Patient's pulse was elevated this morning, she reported high anxiety and received PRN Vistaril. She experienced some relief and her pulse has gone to [facility]. MD Cobos notified of patient's elevated pulse during treatment team meeting this morning. Q15 minute checks are maintained for safety.

## 2017-02-10 LAB — PREGNANCY, URINE: PREG TEST UR: NEGATIVE

## 2017-02-10 LAB — TSH: TSH: 0.443 u[IU]/mL (ref 0.350–4.500)

## 2017-02-10 NOTE — Plan of Care (Signed)
Problem: Activity: Goal: Interest or engagement in leisure activities will improve Outcome: Progressing Patient appears to be more in the milieu today. Not isolating as much

## 2017-02-10 NOTE — Progress Notes (Addendum)
[facility] MD Progress Note  02/10/2017 1:09 PM Emmalena Canny  MRN:  [id number] Subjective:  Patient reports ongoing depression, ruminations about recent break up, but states she is starting to feel " a little bit better", and states she is trying to move on from broken relationship so she can focus more on herself and her improvement . Denies medication side effects at this time. Objective : I have discussed case with treatment team and have met with patient. She presents depressed but significantly better than on admission- has better eye contact, is less tearful, smiles at times appropriately and overall affect presents more reactive. Also, she is less ruminative about relationship issues. She denies medication side effects. She denies any active suicidal ideations, denies self injurious ideations, contracts for safety at this time. Of note, patient has reported history of eating disorder, mainly bulimia, but also prior history of restricting and vomiting.  At this time denies any active eating disorder behaviors, and none have been noted .  Principal Problem:  MDD Diagnosis:   Patient Active Problem List   Diagnosis Date Noted  . MDD (major depressive disorder), recurrent episode ([place]) [F33.9] 02/09/2017  . Dyspareunia [IMO0002] 04/15/2014  . Esophagitis, acute [K20.9] 10/28/2013  . Candidiasis of vulva and vagina [B37.3] 08/28/2013  . Vaginitis and vulvovaginitis, unspecified [N76.0] 06/26/2013  . Generalized anxiety disorder [F41.1] 03/02/2012  . Oppositional defiant disorder [F91.3] 03/02/2012  . Bulimia nervosa [F50.2] 02/26/2012  . Recurrent major depression-severe ([place]) [F33.2] 02/26/2012  . Urinary tract infection [N39.0]   . Vision abnormalities [H53.9]    Total Time spent with patient: 20 minutes  Past Medical History:  Past Medical History:  Diagnosis Date  . ADHD (attention deficit hyperactivity disorder)   . ADHD (attention deficit hyperactivity disorder)   . Anorexia  nervosa with bulimia   . Anxiety   . Aspirin overdose 02/2012  . Depression   . Dysrhythmia   . Eating disorder   . Electrolyte disturbance 2010, 2012   hypokalemia, hypochloremia.  . Urinary tract infection   . Varicella   . Vision abnormalities     Past Surgical History:  Procedure Laterality Date  . COLONOSCOPY WITH ESOPHAGOGASTRODUODENOSCOPY (EGD) N/A 10/28/2013   Procedure: COLONOSCOPY WITH ESOPHAGOGASTRODUODENOSCOPY (EGD);  Surgeon: Jerene Bears, MD;  Location: [facility] ENDOSCOPY;  Service: Endoscopy;  Laterality: N/A;  . GUM SURGERY    . INDUCED ABORTION     Family History:  Family History  Problem Relation Age of Onset  . Cancer Father   . Depression Sister   . Depression Paternal Aunt   . Drug abuse Maternal Uncle     deceased  . Drug abuse Paternal Aunt   . Colon cancer Maternal Grandmother    Social History:  History  Alcohol Use  . 0.0 oz/week    Comment: occasioally      History  Drug Use No    Comment: 3x a week    Social History   Social History  . Marital status: Single    Spouse name: N/A  . Number of children: N/A  . Years of education: N/A   Occupational History  . [employer]   Social History Main Topics  . Smoking status: Former Smoker    Packs/day: 0.50    Types: Cigarettes    Quit date: 06/25/2013  . Smokeless tobacco: Never Used  . Alcohol use 0.0 oz/week     Comment: occasioally   . Drug use: No     Comment: 3x a week  .  Sexual activity: Yes    Partners: Male    Birth control/ protection: OCP, Pill   Other Topics Concern  . None   Social History Narrative  . None   Additional Social History:   Sleep: improved  Appetite:  Fair  Current Medications: Current Facility-Administered Medications  Medication Dose Route Frequency Provider Last Rate Last Dose  . acetaminophen (TYLENOL) tablet 650 mg  650 mg Oral Q6H PRN Laverle Hobby, PA-C      . alum & mag hydroxide-simeth (MAALOX/MYLANTA) 200-200-20 MG/5ML suspension 30 mL  30  mL Oral Q4H PRN Laverle Hobby, PA-C      . ARIPiprazole (ABILIFY) tablet 2 mg  2 mg Oral Daily Jenne Campus, MD   2 mg at 02/10/17 0751  . FLUoxetine (PROZAC) capsule 40 mg  40 mg Oral Daily Jenne Campus, MD   40 mg at 02/10/17 0751  . gabapentin (NEURONTIN) capsule 300 mg  300 mg Oral TID Laverle Hobby, PA-C   300 mg at 02/10/17 1210  . hydrOXYzine (ATARAX/VISTARIL) tablet 25 mg  25 mg Oral Q6H PRN Laverle Hobby, PA-C   25 mg at 02/10/17 0753  . magnesium hydroxide (MILK OF MAGNESIA) suspension 30 mL  30 mL Oral Daily PRN Laverle Hobby, PA-C      . Norgestimate-Ethinyl Estradiol Triphasic 0.18/0.215/0.25 MG-35 MCG tablet 1 tablet  1 tablet Oral Daily Laverle Hobby, PA-C      . traZODone (DESYREL) tablet 50 mg  50 mg Oral QHS,MR X 1 Laverle Hobby, PA-C        Lab Results:  Results for orders placed or performed during the hospital encounter of 02/08/17 (from the past 48 hour(s))  Comprehensive metabolic panel     Status: Abnormal   Collection Time: 02/08/17 10:27 PM  Result Value Ref Range   Sodium 138 135 - 145 mmol/L   Potassium 3.9 3.5 - 5.1 mmol/L   Chloride 106 101 - 111 mmol/L   CO2 24 22 - 32 mmol/L   Glucose, Bld 113 (H) 65 - 99 mg/dL   BUN 10 6 - 20 mg/dL   Creatinine, Ser 0.83 0.44 - 1.00 mg/dL   Calcium 9.7 8.9 - 10.3 mg/dL   Total Protein 7.8 6.5 - 8.1 g/dL   Albumin 4.2 3.5 - 5.0 g/dL   AST 20 15 - 41 U/L   ALT 14 14 - 54 U/L   Alkaline Phosphatase 61 38 - 126 U/L   Total Bilirubin 0.8 0.3 - 1.2 mg/dL   GFR calc non Af Amer >60 >60 mL/min   GFR calc Af Amer >60 >60 mL/min    Comment: (NOTE) The eGFR has been calculated using the CKD EPI equation. This calculation has not been validated in all clinical situations. eGFR's persistently <60 mL/min signify possible Chronic Kidney Disease.    Anion gap 8 5 - 15  Ethanol     Status: None   Collection Time: 02/08/17 10:27 PM  Result Value Ref Range   Alcohol, Ethyl (B) <5 <5 mg/dL    Comment:         LOWEST DETECTABLE LIMIT FOR SERUM ALCOHOL IS 5 mg/dL FOR MEDICAL PURPOSES ONLY   Salicylate level     Status: None   Collection Time: 02/08/17 10:27 PM  Result Value Ref Range   Salicylate Lvl <1.1 2.8 - 30.0 mg/dL  Acetaminophen level     Status: Abnormal   Collection Time: 02/08/17 10:27 PM  Result Value  Ref Range   Acetaminophen (Tylenol), Serum <10 (L) 10 - 30 ug/mL    Comment:        THERAPEUTIC CONCENTRATIONS VARY SIGNIFICANTLY. A RANGE OF 10-30 ug/mL MAY BE AN EFFECTIVE CONCENTRATION FOR MANY PATIENTS. HOWEVER, SOME ARE BEST TREATED AT CONCENTRATIONS OUTSIDE THIS RANGE. ACETAMINOPHEN CONCENTRATIONS >150 ug/mL AT 4 HOURS AFTER INGESTION AND >50 ug/mL AT 12 HOURS AFTER INGESTION ARE OFTEN ASSOCIATED WITH TOXIC REACTIONS.   cbc     Status: None   Collection Time: 02/08/17 10:27 PM  Result Value Ref Range   WBC 10.0 4.0 - 10.5 K/uL   RBC 4.85 3.87 - 5.11 MIL/uL   Hemoglobin 14.4 12.0 - 15.0 g/dL   HCT 41.3 36.0 - 46.0 %   MCV 85.2 78.0 - 100.0 fL   MCH 29.7 26.0 - 34.0 pg   MCHC 34.9 30.0 - 36.0 g/dL   RDW 12.7 11.5 - 15.5 %   Platelets 264 150 - 400 K/uL    Blood Alcohol level:  Lab Results  Component Value Date   ETH <5 02/08/2017   ETH <11 96/75/9163    Metabolic Disorder Labs: No results found for: HGBA1C, MPG No results found for: PROLACTIN No results found for: CHOL, TRIG, HDL, CHOLHDL, VLDL, LDLCALC  Physical Findings: AIMS:  , ,  ,  ,    CIWA:    COWS:     Musculoskeletal: Strength & Muscle Tone: within normal limits Gait & Station: normal Patient leans: N/A  Psychiatric Specialty Exam: Physical Exam  ROS no chest pain, no shortness of breath, no vomiting , no fever  Blood pressure 115/70, pulse 88, temperature 97.7 F (36.5 C), temperature source Oral, resp. rate 20, height 5' 10"  (1.778 m), weight 66.8 kg (147 lb 3.2 oz), SpO2 99 %.Body mass index is 21.12 kg/m.  General Appearance: improving / better groomed   Eye Contact:  Good   Speech:  Normal Rate  Volume:  Decreased  Mood:  remains depressed, but states she is feeling better than prior to admission   Affect:  remains constricted, but more reactive, and today significantly less tearful  Thought Process:  Linear and Descriptions of Associations: Intact  Orientation:  Full (Time, Place, and Person)  Thought Content:  denies hallucinations, no delusions, not internally preoccupied - less intensely ruminative about recent relationship stressors  Suicidal Thoughts:  No today denies any suicidal plan or intention and contracts for safety on unit   Homicidal Thoughts:  No denies any homicidal or violent ideations, and in particular also denies any HI towards her ex BF  Memory:  recent and remote grossly intact   Judgement:  Other:  improving  Insight:  improving   Psychomotor Activity:  Normal  Concentration:  Concentration: Good and Attention Span: Good  Recall:  Good  Fund of Knowledge:  Good  Language:  Good  Akathisia:  Negative  Handed:  Right  AIMS (if indicated):     Assets:  Desire for Improvement Resilience  ADL's:  Intact  Cognition:  WNL  Sleep:  Number of Hours: 6.75   Assessment - patient remains depressed, sad, ruminative, but to a lesser degree than prior to admission and today her affect is partially more reactive. Denies suicidal ideations at this time. Thus far tolerating medication side effects. Denies active eating disorder behaviors such as restricting or purging .    Treatment Plan Summary: Daily contact with patient to assess and evaluate symptoms and progress in treatment, Medication management, Plan inpatient treatment  and medications as below  Encourage group and milieu participation to work on coping skills and symptom reduction  Continue Prozac 40 mgrs QDAY for depression, anxiety Continue Neurontin 300 mgrs TID for anxiety Continue Abilify 2 mgrs QDAY for mood disorder, antidepressant augmentation Continue Hydroxyzine 25 mgrs Q  6 hours PRN for anxiety Continue Trazodone 50 mgrs QHS  PRN for insomnia  Treatment team working on disposition planning options Of note, urine pregnancy results pending , will also order TSH   Neita Garnet, MD 02/10/2017, 1:09 PM

## 2017-02-10 NOTE — Progress Notes (Signed)
D: Pt at the time of assessment was alert and oriented x 4. Pt at this time Pt endorses moderate depression and anxiety; states, "I'm stressed; my family just left and I'm still feel stressed." Pt however denied pain, SI/HI or AVH. Pt was flat, isolative and withdrawn to self even while in the dayroom. Pt remained calm and cooperative A: Medications offered as prescribed.  Support, encouragement, and safe environment provided.  15-minute safety checks continue. R: Pt attended wrap-up group. Safety checks continue.

## 2017-02-10 NOTE — [facility] Group Notes (Signed)
[facility] Group Notes:  Leisure and Lifestyle Changes  Date:  02/10/2017  Time:  2:17 PM  Type of Therapy:  Psychoeducational Skills  Participation Level:  Minimal  Participation Quality:  Appropriate  Affect:  Anxious and Blunted  Cognitive:  Appropriate  Insight:  Lacking  Engagement in Group:  Improving  Modes of Intervention:  Discussion and Education  Summary of Progress/Problems: Danielle Prince attended group and appeared engaged although she did not speak.   [name], [name] 02/10/2017, 2:17 PM

## 2017-02-10 NOTE — [facility] Suicide Risk Assessment (Signed)
[facility] INPATIENT:  Family/Significant Other Suicide Prevention Education  Suicide Prevention Education:  Education Completed; Danielle MonsJanet Prince ([phone]), has been identified by the patient as the family member/significant other with whom the patient will be residing, and identified as the person(s) who will aid the patient in the event of a mental health crisis (suicidal ideations/suicide attempt).  With written consent from the patient, the family member/significant other has been provided the following suicide prevention education, prior to the and/or following the discharge of the patient.  The suicide prevention education provided includes the following:  Suicide risk factors  Suicide prevention and interventions  National Suicide Hotline telephone number  [facility] assessment telephone number  [facility] Assistance 911  [facility] and/or [facility]idential Mobile Crisis [facility] telephone number  Request made of family/significant other to:  Remove weapons (e.g., guns, rifles, knives), all items previously/currently identified as safety concern.    Remove drugs/medications (over-the-counter, prescriptions, illicit drugs), all items previously/currently identified as a safety concern.  The family member/significant other verbalizes understanding of the suicide prevention education information provided.  The family member/significant other agrees to remove the items of safety concern listed above.  Danielle Prince 02/10/2017, 2:47 PM

## 2017-02-10 NOTE — [facility] Group Notes (Signed)
[facility] Group Therapy 02/10/2017 1:15pm  Type of Therapy: Mental Health Association Presentation  Participation Level: Pt invited. Did not attend.   Vito BackersLynn B. Beverely PaceBryant, MSW, [facility] Hospital[facility]WA 02/10/2017 4:12 PM

## 2017-02-10 NOTE — Progress Notes (Signed)
Patient ID: Danielle Prince, female   DOB: 1996-08-01, 21 y.o.   MRN: [id number]  DAR: Pt. Denies SI/HI and A/V Hallucinations. She reports sleep is fair, energy level is normal, appetite is fair, and concentration is good. She rates depression, hopelessness, and anxiety 5/10. Patient does not report any pain or discomfort at this time. Support and encouragement provided to the patient to stay visible in the milieu and try not to isolate. Patient has just returned her urine specimen cup with a specimen and this was given to the lab. Patient is minimal with Clinical research associatewriter but cooperative. She reports her goal is, "coming to terms with my situation." Q15 minute checks are maintained for safety.

## 2017-02-11 MED ORDER — LORAZEPAM 0.5 MG PO TABS
0.5000 mg | ORAL_TABLET | Freq: Four times a day (QID) | ORAL | Status: DC | PRN
  Administered 2017-02-11 – 2017-02-13 (×5): 0.5 mg via ORAL
  Filled 2017-02-11 (×5): qty 1

## 2017-02-11 MED ORDER — ESCITALOPRAM OXALATE 5 MG PO TABS: 5.0000 mg | ORAL_TABLET | Freq: Every day | ORAL | Status: DC

## 2017-02-11 MED ORDER — ESCITALOPRAM OXALATE 5 MG PO TABS
5.0000 mg | ORAL_TABLET | Freq: Every day | ORAL | Status: DC
  Administered 2017-02-12 – 2017-02-13 (×2): 5 mg via ORAL
  Filled 2017-02-11 (×3): qty 1

## 2017-02-11 NOTE — Progress Notes (Signed)
Recreation Therapy Notes  Date: 02/11/17 Time: 0930 Location: 300 [address] Dayroom  Group Topic: Stress Management  Goal Area(s) Addresses:  Patient will verbalize importance of using healthy stress management.  Patient will identify positive emotions associated with healthy stress management.   Intervention: Stress Management  Activity :  Focus Meditation.  LRT introduced the stress management technique of meditation.  LRT played a meditation on focus from the Calm app so patients could engage in the activity.  Patients were to follow along as the meditation was played to participate.  Education:  Stress Management, Discharge Planning.   Education Outcome: Acknowledges edcuation/In group clarification offered/Needs additional education  Clinical Observations/Feedback: Pt did not attend group.   Caroll RancherMarjette Ansleigh Safer, LRT/CTRS         Caroll RancherLindsay, Beverely Suen A 02/11/2017 12:37 PM

## 2017-02-11 NOTE — [facility] Group Notes (Addendum)
In wrap-up group patients were asked to describe their day in one word and then elaborate.  Pts word to describe her day was "blank". Pt states that she is not feeling anything right now because she is trying to block all the pain that she has had to deal with and just doesn't want to feel right now.  Pt states that hopefully soon she can replace her negative thoughts with positive ones.   Danielle Prince, MHT

## 2017-02-11 NOTE — Progress Notes (Signed)
Data. Patient denies SI/HI/AVH.  Patient interacting well with staff and other patients. Affect is flat, but does brighten with interaction. Patient has reported that she has felt anxious and received PRNs as appropriate. Action. Emotional support and encouragement offered. Education provided on medication, indications and side effect. Q 15 minute checks done for safety. Response. Safety on the unit maintained through 15 minute checks.  Medications taken as prescribed. Attended groups. Remained calm and appropriate through out shift.

## 2017-02-11 NOTE — [facility] Group Notes (Signed)
[facility] LCSW Group Therapy 02/11/2017 1:15pm  Type of Therapy: Group Therapy- Feelings Around Relapse and Recovery  Participation Level: Not Active  Participation Quality:  Appropriate  Affect:  Appropriate  Cognitive: Alert and Oriented   Insight:  Developing   Engagement in Therapy: Minimal  Modes of Intervention: Clarification, Confrontation, Discussion, Education, Exploration, Limit-setting, Orientation, Problem-solving, Rapport Building, Dance movement psychotherapisteality Testing, Socialization and Support  Summary of Progress/Problems: The topic for today was feelings about relapse. The group discussed what relapse prevention is to them and identified triggers that they are on the path to relapse. Members also processed their feeling towards relapse and were able to relate to common experiences. Group also discussed coping skills that can be used for relapse prevention.  Pt was present for the duration of the group. Pt did not contribute to the discussion, but did remain attentive.    Therapeutic Modalities:   Cognitive Behavioral Therapy Solution-Focused Therapy Assertiveness Training Relapse Prevention Therapy   Jonathon JordanLynn B Corrie Reder, MSW, LCSWA [phone] 02/11/2017 3:42 PM

## 2017-02-11 NOTE — Progress Notes (Signed)
D: Pt at the time of assessment was alert and oriented x 4. Pt at the time endorses moderate depression and anxiety; states, "I feel a little better than I was yesterday.I'm getting there." Pt however denied pain, SI/HI or AVH. Pt was flat, isolative and withdrawn to self even while in the dayroom. Pt remained calm and cooperative A: Medications offered as prescribed.  Support, encouragement, and safe environment provided.  15-minute safety checks continue. R: Pt attended wrap-up group. Safety checks continue.

## 2017-02-11 NOTE — Progress Notes (Signed)
Adult Psychoeducational Group Note  Date:  02/11/2017 Time:  10:03 AM  Group Topic/Focus:  Relapse Prevention Planning:   The focus of this group is to define relapse and discuss the need for planning to combat relapse.  Pt did attend group this morning. Pt was asked to join and refused to attend.

## 2017-02-11 NOTE — Progress Notes (Addendum)
[facility] MD Progress Note  02/11/2017 3:48 PM Danielle Prince  MRN:  [id number] Subjective:  Patient reports partial improvement of mood, less depressed, but reports lingering anxiety and  states she is still struggling with recent break up, remains ruminative about it. Denies suicidal ideations. Denies medication side effects. Objective : I have discussed case with treatment team and have met with patient. Patient presents partially improved compared to her admission presentation- less depressed, affect still constricted, but more reactive, no longer tearful, more future oriented. Denies any current suicidal ideations. Reports ongoing anxiety. Continues to ruminate about relationship issues- states she felt relationship was going well and he left her without any closure or formal break up ( simply relocated and changed number ) . She is gradually becoming better able to focus on herself , but continues to express significant depression over the above . She is tolerating medications well . Of note, however, states she felt Prozac was not " really helping much " for her depression and anxiety prior to admission , and increased Fluoxetine dose may be contributing to increased subjective sense of anxiety.  More visible in milieu, going to some groups. Behavior on unit calm and in good control.  Affect is becoming more responsive to support, empathy, and tends to improve during session. Has history of eating disorder, but states she is not currently focusing on weight or food issues, states she feels " that is under control right now", denies any purging , vomiting, restricting  Principal Problem:  MDD Diagnosis:   Patient Active Problem List   Diagnosis Date Noted  . MDD (major depressive disorder), recurrent episode ([place]) [F33.9] 02/09/2017  . Dyspareunia [IMO0002] 04/15/2014  . Esophagitis, acute [K20.9] 10/28/2013  . Candidiasis of vulva and vagina [B37.3] 08/28/2013  . Vaginitis and  vulvovaginitis, unspecified [N76.0] 06/26/2013  . Generalized anxiety disorder [F41.1] 03/02/2012  . Oppositional defiant disorder [F91.3] 03/02/2012  . Bulimia nervosa [F50.2] 02/26/2012  . Recurrent major depression-severe ([place]) [F33.2] 02/26/2012  . Urinary tract infection [N39.0]   . Vision abnormalities [H53.9]    Total Time spent with patient: 20 minutes  Past Medical History:  Past Medical History:  Diagnosis Date  . ADHD (attention deficit hyperactivity disorder)   . ADHD (attention deficit hyperactivity disorder)   . Anorexia nervosa with bulimia   . Anxiety   . Aspirin overdose 02/2012  . Depression   . Dysrhythmia   . Eating disorder   . Electrolyte disturbance 2010, 2012   hypokalemia, hypochloremia.  . Urinary tract infection   . Varicella   . Vision abnormalities     Past Surgical History:  Procedure Laterality Date  . COLONOSCOPY WITH ESOPHAGOGASTRODUODENOSCOPY (EGD) N/A 10/28/2013   Procedure: COLONOSCOPY WITH ESOPHAGOGASTRODUODENOSCOPY (EGD);  Surgeon: Jerene Bears, MD;  Location: [facility] ENDOSCOPY;  Service: Endoscopy;  Laterality: N/A;  . GUM SURGERY    . INDUCED ABORTION     Family History:  Family History  Problem Relation Age of Onset  . Cancer Father   . Depression Sister   . Depression Paternal Aunt   . Drug abuse Maternal Uncle     deceased  . Drug abuse Paternal Aunt   . Colon cancer Maternal Grandmother    Social History:  History  Alcohol Use  . 0.0 oz/week    Comment: occasioally      History  Drug Use No    Comment: 3x a week    Social History   Social History  . Marital status: Single  Spouse name: N/A  . Number of children: N/A  . Years of education: N/A   Occupational History  . [employer]   Social History Main Topics  . Smoking status: Former Smoker    Packs/day: 0.50    Types: Cigarettes    Quit date: 06/25/2013  . Smokeless tobacco: Never Used  . Alcohol use 0.0 oz/week     Comment: occasioally   . Drug use: No      Comment: 3x a week  . Sexual activity: Yes    Partners: Male    Birth control/ protection: OCP, Pill   Other Topics Concern  . None   Social History Narrative  . None   Additional Social History:   Sleep: Good  Appetite:  Fair  Current Medications: Current Facility-Administered Medications  Medication Dose Route Frequency Provider Last Rate Last Dose  . acetaminophen (TYLENOL) tablet 650 mg  650 mg Oral Q6H PRN Laverle Hobby, PA-C      . alum & mag hydroxide-simeth (MAALOX/MYLANTA) 200-200-20 MG/5ML suspension 30 mL  30 mL Oral Q4H PRN Laverle Hobby, PA-C      . ARIPiprazole (ABILIFY) tablet 2 mg  2 mg Oral Daily Jenne Campus, MD   2 mg at 02/11/17 0805  . [START ON 02/12/2017] escitalopram (LEXAPRO) tablet 5 mg  5 mg Oral Daily Myer Peer Cobos, MD      . gabapentin (NEURONTIN) capsule 300 mg  300 mg Oral TID Laverle Hobby, PA-C   300 mg at 02/11/17 1301  . LORazepam (ATIVAN) tablet 0.5 mg  0.5 mg Oral Q6H PRN Jenne Campus, MD   0.5 mg at 02/11/17 1302  . magnesium hydroxide (MILK OF MAGNESIA) suspension 30 mL  30 mL Oral Daily PRN Laverle Hobby, PA-C      . Norgestimate-Ethinyl Estradiol Triphasic 0.18/0.215/0.25 MG-35 MCG tablet 1 tablet  1 tablet Oral Daily Laverle Hobby, PA-C      . traZODone (DESYREL) tablet 50 mg  50 mg Oral QHS,MR X 1 Laverle Hobby, PA-C   50 mg at 02/10/17 2116    Lab Results:  Results for orders placed or performed during the hospital encounter of 02/09/17 (from the past 48 hour(s))  Pregnancy, urine     Status: None   Collection Time: 02/10/17  2:42 AM  Result Value Ref Range   Preg Test, Ur NEGATIVE NEGATIVE    Comment:        THE SENSITIVITY OF THIS METHODOLOGY IS >20 mIU/mL. Performed at [facility], [place] [address], [place], [place] [address]   TSH     Status: None   Collection Time: 02/10/17  6:16 PM  Result Value Ref Range   TSH 0.443 0.350 - 4.500 uIU/mL    Comment: Performed by a 3rd  Generation assay with a functional sensitivity of <=0.01 uIU/mL. Performed at [facility], [place] [address] [place], [place] [address]     Blood Alcohol level:  Lab Results  Component Value Date   [facility] <5 02/08/2017   ETH <11 16/38/4665    Metabolic Disorder Labs: No results found for: HGBA1C, MPG No results found for: PROLACTIN No results found for: CHOL, TRIG, HDL, CHOLHDL, VLDL, LDLCALC  Physical Findings: AIMS:  , ,  ,  ,    CIWA:    COWS:     Musculoskeletal: Strength & Muscle Tone: within normal limits Gait & Station: normal Patient leans: N/A  Psychiatric Specialty Exam: Physical Exam  ROS no nausea, no vomiting , no rash   Blood pressure 104/67, pulse (!) 126, temperature 97.7 F (36.5 C), temperature source Oral, resp. rate 18, height _0  (1.778 m), weight 66.8 kg (147 lb 3.2 oz), SpO2 99 %.Body mass index is 21.12 kg/m.  General Appearance: improved grooming   Eye Contact:  improved   Speech:  Normal Rate  Volume:  Normal  Mood:  less severely depressed  Affect:  less constricted, but still sad,does smile at times appropriately   Thought Process:  Linear and Descriptions of Associations: Intact  Orientation:  Full (Time, Place, and Person)  Thought Content:  (+) ruminations about recent break up, no hallucinations, no delusions   Suicidal Thoughts:  No today denies any suicidal plan or intention and contracts for safety on unit   Homicidal Thoughts:  No denies any homicidal or violent ideations, and in particular also denies any HI towards her ex BF  Memory: recent and remote grossly intact   Judgement:  Other:  improved   Insight:  imprvoed   Psychomotor Activity:  Normal  Concentration:  Concentration: Good and Attention Span: Good  Recall:  Good  Fund of Knowledge:  Good  Language:  Good  Akathisia:  Negative  Handed:  Right  AIMS (if indicated):     Assets:  Communication Skills Desire for Improvement Physical  Health Resilience  ADL's:  Intact  Cognition:  WNL  Sleep:  Number of Hours: 6.25   Assessment - patient is presenting with partially improved mood and range of affect. Denies SI. Although partially improved, reports ongoing anxiety,   and remains  ruminative about relationship issues. She is tolerating medications well , but today expresses interest in switching from Prozac to another antidepressant as she feels it was not helping her symptoms significantly and could be contributing to anxiety at higher dose . Options discussed, agrees to Lexapro trial.     Treatment Plan Summary: Treatment /medication management reviewed as below today 2/23.  Daily contact with patient to assess and evaluate symptoms and progress in treatment, Medication management, Plan inpatient treatment  and medications as below  Encourage group and milieu participation to work on coping skills and symptom reduction  D/C Prozac Start Lexapro 5 mgrs QDAY for depression and anxieety  Continue Neurontin 300 mgrs TID for anxiety Continue Abilify 2 mgrs QDAY for mood disorder, antidepressant augmentation Continue Hydroxyzine 25 mgrs Q 6 hours PRN for anxiety Continue Trazodone 50 mgrs QHS  PRN for insomnia  Treatment team working on disposition planning options   Neita Garnet, MD 02/11/2017, 3:48 PM   Patient ID: Vania Rea, female   DOB: 1996-05-18, 21 y.o.   MRN: [id number]

## 2017-02-12 NOTE — [facility] Group Notes (Signed)
[facility] Group Notes: (Clinical Social Work)   02/12/2017      Type of Therapy:  Group Therapy   Participation Level:  Did Not Attend despite MHT prompting and announcements being made several times.   Deray Dawes Grossman-Orr, LCSW 02/12/2017, 12:25 PM 

## 2017-02-12 NOTE — [facility] Group Notes (Signed)
Identifying Needs   Date:  02/12/2017  Time:  1400  Type of Therapy:  Nurse Education  /  Identifying Needs :  The group focuses on teaching patients how to identify their needs and then how to develop skills needed to get their needs met.  Participation Level:  Active  Participation Quality:  Appropriate and Attentive  Affect:  Blunted  Cognitive:  Alert  Insight:  Limited  Engagement in Group:  Engaged  Modes of Intervention:  Education  Summary of Progress/Problems:  Lauralyn Primes 02/12/2017, 7:08 PM

## 2017-02-12 NOTE — Progress Notes (Signed)
Nursing Progress Note: 7p-7a D: Pt currently presents with a flat/anxious affect and behavior. Interacting minimally with milieu. Pt reports ok sleep with current medication regimen.   A: Pt provided with medications per providers orders. Pt's labs and vitals were monitored throughout the night. Pt supported emotionally and encouraged to express concerns and questions. Pt educated on medications.  R: Pt's safety ensured with 15 minute and environmental checks. Pt currently denies SI/HI/Self Harm and AVH. Pt verbally contracts to seek staff if SI/HI or A/VH occurs and to consult with staff before acting on any harmful thoughts. Will continue to monitor.

## 2017-02-12 NOTE — Progress Notes (Signed)
D: Pt forwards little with this nurse.  Pt c/o anxiety. Denies SI/HI/AVH  A: Encouragement and support provided. Special checks q 15 mins in place for safety.  R:Pt compliant with medication regimen. Safety maintained. Will continue to monitor.

## 2017-02-12 NOTE — Plan of Care (Signed)
Problem: Safety: Goal: Periods of time without injury will increase Outcome: Progressing Pt safe on the unit at this time   

## 2017-02-12 NOTE — Progress Notes (Signed)
D: Pt denies SI/HI/AVH. Pt is pleasant and cooperative. Pt stated she felt a lot better and  Had time to think things through.   A: Pt was offered support and encouragement. Pt was given scheduled medications. Pt was encourage to attend groups. Q 15 minute checks were done for safety.   R:Pt attends groups and interacts well with peers and staff. Pt is taking medication. Pt has no complaints at this time .Pt receptive to treatment and safety maintained on unit.

## 2017-02-12 NOTE — [facility] Group Notes (Signed)
One's Best Self  Date:  02/12/2017  Time:  0930  Type of Therapy:  Nurse Education / One's Best Self:  The group is focused on teaching patients how to identify their best self and how to identify one step they can take today towards returning to that slef.  Participation Level:  Patient did not attend.  Participation Quality:    Affect:    Cognitive:    Insight:    Engagement in Group:    Modes of Intervention:    Summary of Progress/Problems:  Rich BraveDuke, Marjan Rosman Lynn 02/12/2017, 10:46 AM

## 2017-02-12 NOTE — Progress Notes (Signed)
[facility] MD Progress Note  02/12/2017 2:40 PM Ramia Sidney  MRN:  [id number] Subjective:   She states she is feeling better than she did , but states she continues to " think about it a lot" ( referring to her boyfriend leaving her). She states she is less upset about the actual break up and more upset about " how he could be so mean with me , the way he left " . States that boyfriend lied to her about relocating and having her move down with him once he had a job. He left, and then changed his phone. She then called his mother , who told her the relationship was over . Denies medication side effects. Future oriented, and looking forward to discharging soon. Objective :I have reviewed chart notes and have met with patient. She presents with improved mood , affect- no longer tearful, and although still ruminative, less constricted, and smiling more often . She is also more future oriented, and stated she is thinking of returning to college in the near future . No disruptive or agitated behaviors on unit, going to groups.  As before, she denies any current active symptoms of eating disorder, states she has been eating " all right ", and does not seem concerned about food/ PO intake related issues . Denies any purging or restricting . Hoping for discharge soon, plans to go live with mother . Denies medication side effects but states that she has noted loose stools today. No associated symptoms  Principal Problem:  MDD Diagnosis:   Patient Active Problem List   Diagnosis Date Noted  . MDD (major depressive disorder), recurrent episode ([place]) [F33.9] 02/09/2017  . Dyspareunia [IMO0002] 04/15/2014  . Esophagitis, acute [K20.9] 10/28/2013  . Candidiasis of vulva and vagina [B37.3] 08/28/2013  . Vaginitis and vulvovaginitis, unspecified [N76.0] 06/26/2013  . Generalized anxiety disorder [F41.1] 03/02/2012  . Oppositional defiant disorder [F91.3] 03/02/2012  . Bulimia nervosa [F50.2] 02/26/2012  .  Recurrent major depression-severe ([place]) [F33.2] 02/26/2012  . Urinary tract infection [N39.0]   . Vision abnormalities [H53.9]    Total Time spent with patient: 20 minutes   Past Medical History:  Past Medical History:  Diagnosis Date  . ADHD (attention deficit hyperactivity disorder)   . ADHD (attention deficit hyperactivity disorder)   . Anorexia nervosa with bulimia   . Anxiety   . Aspirin overdose 02/2012  . Depression   . Dysrhythmia   . Eating disorder   . Electrolyte disturbance 2010, 2012   hypokalemia, hypochloremia.  . Urinary tract infection   . Varicella   . Vision abnormalities     Past Surgical History:  Procedure Laterality Date  . COLONOSCOPY WITH ESOPHAGOGASTRODUODENOSCOPY (EGD) N/A 10/28/2013   Procedure: COLONOSCOPY WITH ESOPHAGOGASTRODUODENOSCOPY (EGD);  Surgeon: Jerene Bears, MD;  Location: [facility] ENDOSCOPY;  Service: Endoscopy;  Laterality: N/A;  . GUM SURGERY    . INDUCED ABORTION     Family History:  Family History  Problem Relation Age of Onset  . Cancer Father   . Depression Sister   . Depression Paternal Aunt   . Drug abuse Maternal Uncle     deceased  . Drug abuse Paternal Aunt   . Colon cancer Maternal Grandmother    Social History:  History  Alcohol Use  . 0.0 oz/week    Comment: occasioally      History  Drug Use No    Comment: 3x a week    Social History   Social History  . Marital  status: Single    Spouse name: N/A  . Number of children: N/A  . Years of education: N/A   Occupational History  . [employer] Minor   Social History Main Topics  . Smoking status: Former Smoker    Packs/day: 0.50    Types: Cigarettes    Quit date: 06/25/2013  . Smokeless tobacco: Never Used  . Alcohol use 0.0 oz/week     Comment: occasioally   . Drug use: No     Comment: 3x a week  . Sexual activity: Yes    Partners: Male    Birth control/ protection: OCP, Pill   Other Topics Concern  . None   Social History Narrative  . None   Additional  Social History:   Sleep: Good  Appetite:  Good  Current Medications: Current Facility-Administered Medications  Medication Dose Route Frequency Provider Last Rate Last Dose  . acetaminophen (TYLENOL) tablet 650 mg  650 mg Oral Q6H PRN Laverle Hobby, PA-C      . alum & mag hydroxide-simeth (MAALOX/MYLANTA) 200-200-20 MG/5ML suspension 30 mL  30 mL Oral Q4H PRN Laverle Hobby, PA-C      . ARIPiprazole (ABILIFY) tablet 2 mg  2 mg Oral Daily Jenne Campus, MD   2 mg at 02/12/17 0839  . escitalopram (LEXAPRO) tablet 5 mg  5 mg Oral Daily Jenne Campus, MD   5 mg at 02/12/17 0839  . gabapentin (NEURONTIN) capsule 300 mg  300 mg Oral TID Laverle Hobby, PA-C   300 mg at 02/12/17 1251  . LORazepam (ATIVAN) tablet 0.5 mg  0.5 mg Oral Q6H PRN Jenne Campus, MD   0.5 mg at 02/12/17 1251  . magnesium hydroxide (MILK OF MAGNESIA) suspension 30 mL  30 mL Oral Daily PRN Laverle Hobby, PA-C      . Norgestimate-Ethinyl Estradiol Triphasic 0.18/0.215/0.25 MG-35 MCG tablet 1 tablet  1 tablet Oral Daily Laverle Hobby, PA-C      . traZODone (DESYREL) tablet 50 mg  50 mg Oral QHS,MR X 1 Laverle Hobby, PA-C   50 mg at 02/10/17 2116    Lab Results:  Results for orders placed or performed during the hospital encounter of 02/09/17 (from the past 48 hour(s))  TSH     Status: None   Collection Time: 02/10/17  6:16 PM  Result Value Ref Range   TSH 0.443 0.350 - 4.500 uIU/mL    Comment: Performed by a 3rd Generation assay with a functional sensitivity of <=0.01 uIU/mL. Performed at [facility], [place] [address], [place], [place] [address]     Blood Alcohol level:  Lab Results  Component Value Date   [facility] <5 02/08/2017   ETH <11 29/51/8841    Metabolic Disorder Labs: No results found for: HGBA1C, MPG No results found for: PROLACTIN No results found for: CHOL, TRIG, HDL, CHOLHDL, VLDL, LDLCALC  Physical Findings: AIMS:  , ,  ,  ,    CIWA:    COWS:      Musculoskeletal: Strength & Muscle Tone: within normal limits Gait & Station: normal Patient leans: N/A  Psychiatric Specialty Exam: Physical Exam  ROS  Denies headache, denies chest pain , no shortness of breath, no fever, no chills , reports loose stools today, no associated symptoms  Blood pressure (!) 108/54, pulse 62, temperature 98.6 F (37 C), resp. rate 16, height 5' 10"  (1.778 m), weight 66.8 kg (147 lb 3.2 oz), SpO2 99 %.Body mass index  is 21.12 kg/m.  General Appearance: well groomed   Eye Contact:  Good  Speech:  Normal Rate  Volume:  Normal  Mood:  improving gradually, less depressed   Affect:  more reactive, not tearful today  Thought Process:  Goal Directed and Descriptions of Associations: Intact  Orientation:  Full (Time, Place, and Person)  Thought Content:  Remains focused , ruminative related to recent relationship termination   Suicidal Thoughts:  No  today denies any suicidal plan or intention, denies any self injurious ideations ,  and contracts for safety on unit   Homicidal Thoughts:  No  denies any homicidal or violent ideations, and in particular also denies any HI towards her ex BF  Memory: recent and remote grossly intact   Judgement:  Other:  improving   Insight:  improving   Psychomotor Activity:  Normal  Concentration:  Concentration: Good and Attention Span: Good  Recall:  Good  Fund of Knowledge:  Good  Language:  NA  Akathisia:  Negative  Handed:  Right  AIMS (if indicated):     Assets:  Desire for Improvement Physical Health Resilience  ADL's:  Intact  Cognition:  WNL  Sleep:  Number of Hours: 6.75   Assessment - patient is currently improved compared to admission. Mood is partially but significantly improved . Affect less constricted. Remains ruminative about broken relationship, but more future oriented at this time and better able to focus on self . No suicidal ideations. As she improves she is starting to focus more on discharging  soon. Currently on Lexapro, which she feels is helping. Describes some loose stools today, which could be SSRI side effect, but describes as mild and wants to continue trial at this time    Treatment Plan Summary: Treatment /medication management reviewed as below today 2/24  Daily contact with patient to assess and evaluate symptoms and progress in treatment, Medication management, Plan inpatient treatment  and medications as below  Encourage group and milieu participation to work on coping skills and symptom reduction  Continue  Lexapro 5 mgrs QDAY for depression and anxieety  Continue Neurontin 300 mgrs TID for anxiety Continue Abilify 2 mgrs QDAY for mood disorder, antidepressant augmentation Continue Hydroxyzine 25 mgrs Q 6 hours PRN for anxiety Continue Trazodone 50 mgrs QHS  PRN for insomnia  Treatment team working on disposition planning options   Neita Garnet, MD 02/12/2017, 2:40 PM   Patient ID: Vania Rea, female   DOB: [birth year], 21 y.o.   MRN: [id number]

## 2017-02-13 ENCOUNTER — Encounter (HOSPITAL_COMMUNITY): Payer: Self-pay | Admitting: Registered Nurse

## 2017-02-13 MED ORDER — TRAZODONE HCL 50 MG PO TABS: 50.0000 mg | ORAL_TABLET | Freq: Every evening | ORAL | 0 refills | Status: DC | PRN

## 2017-02-13 MED ORDER — ESCITALOPRAM OXALATE 5 MG PO TABS: 5.0000 mg | ORAL_TABLET | Freq: Every day | ORAL | 0 refills | Status: DC

## 2017-02-13 MED ORDER — ARIPIPRAZOLE 2 MG PO TABS: 2.0000 mg | ORAL_TABLET | Freq: Every day | ORAL | 0 refills | Status: DC

## 2017-02-13 MED ORDER — GABAPENTIN 300 MG PO CAPS: 300.0000 mg | ORAL_CAPSULE | Freq: Three times a day (TID) | ORAL | 0 refills | Status: DC

## 2017-02-13 NOTE — Discharge Summary (Signed)
Physician Discharge Summary Note  Patient:  Danielle Prince is an 21 y.o., female MRN:  [id number] DOB:  12-25-1995 Patient phone:  [phone] (home)  Patient address:   [address] [place] [address],  Total Time spent with patient: 30 minutes  Date of Admission:  02/09/2017 Date of Discharge: 02/13/17  Reason for Admission:  Worsening of depression with passive suicidal thoughts not caring if she dies; also worsening anxiety with panic attacks   Principal Problem: MDD (major depressive disorder), recurrent episode Danielle Prince) Discharge Diagnoses: Patient Active Problem List   Diagnosis Date Noted  . MDD (major depressive disorder), recurrent episode (HCC) [F33.9] 02/09/2017  . Dyspareunia [IMO0002] 04/15/2014  . Esophagitis, acute [K20.9] 10/28/2013  . Candidiasis of vulva and vagina [B37.3] 08/28/2013  . Vaginitis and vulvovaginitis, unspecified [N76.0] 06/26/2013  . Generalized anxiety disorder [F41.1] 03/02/2012  . Oppositional defiant disorder [F91.3] 03/02/2012  . Bulimia nervosa [F50.2] 02/26/2012  . Recurrent major depression-severe (HCC) [F33.2] 02/26/2012  . Urinary tract infection [N39.0]   . Vision abnormalities [H53.9]     Past Psychiatric History: Major Depression, Bulimia, and history dx or Bipolar disorder  Past Medical History:  Past Medical History:  Diagnosis Date  . ADHD (attention deficit hyperactivity disorder)   . ADHD (attention deficit hyperactivity disorder)   . Anorexia nervosa with bulimia   . Anxiety   . Aspirin overdose 02/2012  . Depression   . Dysrhythmia   . Eating disorder   . Electrolyte disturbance 2010, 2012   hypokalemia, hypochloremia.  . Urinary tract infection   . Varicella   . Vision abnormalities     Past Surgical History:  Procedure Laterality Date  . COLONOSCOPY WITH ESOPHAGOGASTRODUODENOSCOPY (EGD) N/A 10/28/2013   Procedure: COLONOSCOPY WITH ESOPHAGOGASTRODUODENOSCOPY (EGD);  Surgeon: Beverley Fiedler, MD;  Location:  [facility];  Service: Endoscopy;  Laterality: N/A;  . GUM SURGERY    . INDUCED ABORTION     Family History:  Family History  Problem Relation Age of Onset  . Cancer Father   . Depression Sister   . Depression Paternal Aunt   . Drug abuse Maternal Uncle     deceased  . Drug abuse Paternal Aunt   . Colon cancer Maternal Grandmother    Family Psychiatric  History: Sister/Depression; Paternal Aunt/Depression; Maternal Uncle/Drug abuse Social History:  History  Alcohol Use  . 0.0 oz/week    Comment: occasioally      History  Drug Use No    Comment: 3x a week    Social History   Social History  . Marital status: Single    Spouse name: N/A  . Number of children: N/A  . Years of education: N/A   Occupational History  . [employer]   Social History Main Topics  . Smoking status: Former Smoker    Packs/day: 0.50    Types: Cigarettes    Quit date: 06/25/2013  . Smokeless tobacco: Never Used  . Alcohol use 0.0 oz/week     Comment: occasioally   . Drug use: No     Comment: 3x a week  . Sexual activity: Yes    Partners: Male    Birth control/ protection: OCP, Pill   Other Topics Concern  . None   Social History Narrative  . None    Hospital Course:  Danielle Prince was admitted for MDD (major depressive disorder), recurrent episode (HCC) and crisis management.  She was treated with the following medications Abilify 2 mg daily for Major Depressive Disorder; Prozac  discontinued and started Lexapro for depression/anxiety; Trazodone 50 mg at bed time as needed for insomnia; Continued Ativan 0.5 mg for anxiety.  Danielle Prince was discharged with current medication and was instructed on how to take medications as prescribed; (details listed below under Medication List).  Medical problems were identified and treated as needed.  Home medications were restarted as appropriate.  Improvement was monitored by observation and Danielle Prince daily report of symptom  reduction.  Emotional and mental status was monitored by daily self-inventory reports completed by Danielle Prince and clinical staff.         Danielle Prince was evaluated by the treatment team for stability and plans for continued recovery upon discharge.  Danielle Prince motivation was an integral factor for scheduling further treatment.  Employment, transportation, bed availability, health status, family support, and any pending legal issues were also considered during her hospital stay.  She was offered further treatment options upon discharge including but not limited to Residential, Intensive Outpatient, and Outpatient treatment.  Danielle Prince will follow up with the services as listed below under Follow Up Information.     Upon completion of this admission the [employer] was both mentally and medically stable for discharge denying suicidal/homicidal ideation, auditory/visual/tactile hallucinations, delusional thoughts and paranoia.      Physical Findings: AIMS:  , ,  ,  ,    CIWA:    COWS:     Musculoskeletal: Strength & Muscle Tone: within normal limits Gait & Station: normal Patient leans: N/A  Psychiatric Specialty Exam:  See Suicide Risk Assessment Physical Exam  ROS  Blood pressure 92/63, pulse (!) 123, temperature 98 F (36.7 C), temperature source Oral, resp. rate 16, height 5\' 10"  (1.778 m), weight 66.8 kg (147 lb 3.2 oz), SpO2 99 %.Body mass index is 21.12 kg/m.    Have you used any form of tobacco in the last 30 days? (Cigarettes, Smokeless Tobacco, Cigars, and/or Pipes): No  Has this patient used any form of tobacco in the last 30 days? (Cigarettes, Smokeless Tobacco, Cigars, and/or Pipes) Yes, No  Blood Alcohol level:  Lab Results  Component Value Date   ETH <5 02/08/2017   ETH <11 05/28/2012    Metabolic Disorder Labs:  No results found for: HGBA1C, MPG No results found for: PROLACTIN No results found for: CHOL, TRIG, HDL, CHOLHDL, VLDL,  LDLCALC  See Psychiatric Specialty Exam and Suicide Risk Assessment completed by Attending Physician prior to discharge.  Discharge destination:  Home  Is patient on multiple antipsychotic therapies at discharge:  No   Has Patient had three or more failed trials of antipsychotic monotherapy by history:  No  Recommended Plan for Multiple Antipsychotic Therapies: NA  Discharge Instructions    Diet - low sodium heart healthy    Complete by:  As directed    Discharge instructions    Complete by:  As directed    Take all of you medications as prescribed by your mental healthcare provider.  Report any adverse effects and reactions from your medications to your outpatient provider promptly.  Do not engage in alcohol and or illegal drug use while on prescription medicines. Keep all scheduled appointments. This is to ensure that you are getting refills on time and to avoid any interruption in your medication.  If you are unable to keep an appointment call to reschedule.  Be sure to follow up with resources and follow ups given. In the event of worsening symptoms call the crisis hotline, 911, and or  go to the nearest emergency department for appropriate evaluation and treatment of symptoms. Follow-up with your primary care provider for your medical issues, concerns and or health care needs.   Increase activity slowly    Complete by:  As directed      Allergies as of 02/13/2017   No Known Allergies     Medication List    STOP taking these medications   PROZAC PO     TAKE these medications     Indication  ARIPiprazole 2 MG tablet Commonly known as:  ABILIFY Take 1 tablet (2 mg total) by mouth daily. Start taking on:  02/14/2017  Indication:  Mood Stabilizer (Bipolar disorder)   escitalopram 5 MG tablet Commonly known as:  LEXAPRO Take 1 tablet (5 mg total) by mouth daily. Start taking on:  02/14/2017  Indication:  Generalized Anxiety Disorder, Major Depressive Disorder   gabapentin  300 MG capsule Commonly known as:  NEURONTIN Take 1 capsule (300 mg total) by mouth 3 (three) times daily. What changed:  medication strength  when to take this  reasons to take this  Indication:  Agitation, Neuropathic Pain   Norgestimate-Ethinyl Estradiol Triphasic 0.18/0.215/0.25 MG-35 MCG tablet TAKE 1 TABLET DAILY  Indication:  -   traZODone 50 MG tablet Commonly known as:  DESYREL Take 1 tablet (50 mg total) by mouth at bedtime and may repeat dose one time if needed.  Indication:  Trouble Sleeping      Follow-up Information    [facility]. Go to.   Why:  Appointment on 02/18/17 @1pm  with Darrel Hoover for therapy. Appointment on 3/20 @1 :15p for med management with Sanda Linger.  Contact information: [address] [place], [place] [address] P: [phone] F: [phone]       BEHAVIORAL HEALTH INTENSIVE PSYCH Follow up on 02/21/2017.   Specialty:  Behavioral Health Why:  Assessment appointment for PHP @3pm . Please arrive by 2pm to complete paperwork. Contact information: [address] [id number] mc [place] [address] [phone]          Follow-up recommendations:  Activity:  As tolerated Diet:  As tolerated  Comments:  Vola Beneke has been instructed to take medications as prescribed; and report adverse effects to outpatient provider.  Follow up with primary doctor for any medical issues and If symptoms recur report to nearest emergency or crisis hot line.    SignedAssunta Found, NP 02/13/2017, 9:01 AM

## 2017-02-13 NOTE — B[facility] Group Notes (Signed)
[facility] Group Notes:  (Clinical Social Work)   02/13/2017    10:00-11:00AM  Summary of Progress/Problems:   The main focus of today's process group was to   1)  discuss the importance of adding supports  2)  define health supports versus unhealthy supports  3)  identify the patient's current unhealthy supports and plan how to handle them  4)  Identify the patient's current healthy supports and plan what to add.  An emphasis was placed on using counselor, doctor, therapy groups, 12-step groups, and problem-specific support groups to expand supports.    The patient expressed full comprehension of the concepts presented, and agreed that there is a need to add more supports.  The patient stated her healthy supports include mother, friends, and dog.  Her unhealthy supports include herself at times and people she tries so hard to support but do not return it to her.  Type of Therapy:  Process Group with Motivational Interviewing  Participation Level:  Active  Participation Quality:  Attentive and Sharing  Affect:  Blunted and Depressed  Cognitive:  Appropriate  Insight:  Engaged  Engagement in Therapy:  Engaged  Modes of Intervention:   Education, Support and Processing  Ambrose MantleMareida Grossman-Orr, LCSW 02/13/2017    1:27 PM

## 2017-02-13 NOTE — Progress Notes (Signed)
  [facility]  Will you be returning to the same living situation after discharge:  Yes,  with parent At discharge, do you have transportation home?: Yes,  parent Do you have the ability to pay for your medications: Yes,  no barriers  Release of information consent forms completed and turned in to Medical Records.  Patient to Follow up at: Follow-up Information    [facility]. Go to.   Why:  Appointment on 02/18/17 @1pm  with Darrel HooverKelly Caniglia for therapy. Appointment on 3/20 @1 :15p for med management with Sanda Lingereresa Frances.  Contact information: [address]  [place], [place] [address] P: [phone] F: [phone]       BEHAVIORAL HEALTH INTENSIVE PSYCH Follow up on 02/21/2017.   Specialty:  Behavioral Health Why:  Assessment appointment for PHP @3pm . Please arrive by 2pm to complete paperwork. Contact information: [address] [id number] [place] [phone]          Next level of care provider has access to [facility] Link:no  Safety Planning and Suicide Prevention discussed: Yes,  with patient and with her mother  Have you used any form of tobacco in the last 30 days? (Cigarettes, Smokeless Tobacco, Cigars, and/or Pipes): No  Has patient been referred to the Quitline?: N/A patient is not a smoker  Patient has been referred for addiction treatment: N/A  Lynnell ChadMareida J Grossman-Orr 02/13/2017, 8:54 AM

## 2017-02-13 NOTE — BHH Suicide Risk Assessment (Signed)
[facility] Discharge Suicide Risk Assessment   Principal Problem: MDD (major depressive disorder), recurrent episode [facility]) Discharge Diagnoses:  Patient Active Problem List   Diagnosis Date Noted  . MDD (major depressive disorder), recurrent episode (HCC) [F33.9] 02/09/2017  . Dyspareunia [IMO0002] 04/15/2014  . Esophagitis, acute [K20.9] 10/28/2013  . Candidiasis of vulva and vagina [B37.3] 08/28/2013  . Vaginitis and vulvovaginitis, unspecified [N76.0] 06/26/2013  . Generalized anxiety disorder [F41.1] 03/02/2012  . Oppositional defiant disorder [F91.3] 03/02/2012  . Bulimia nervosa [F50.2] 02/26/2012  . Recurrent major depression-severe (HCC) [F33.2] 02/26/2012  . Urinary tract infection [N39.0]   . Vision abnormalities [H53.9]     Total Time spent with patient: 30 minutes  Musculoskeletal: Strength & Muscle Tone: within normal limits Gait & Station: normal Patient leans: N/A  Psychiatric Specialty Exam: ROS no headache, no chest pain, no shortness of breath, no vomiting , no diarrhea, no fever, no chills   Blood pressure 92/63, pulse (!) 123, temperature 98 F (36.7 C), temperature source Oral, resp. rate 16, height 5\' 10"  (1.778 m), weight 66.8 kg (147 lb 3.2 oz), SpO2 99 %.Body mass index is 21.12 kg/m.  General Appearance: Well Groomed  Eye Contact::  Good  Speech:  Normal Rate409  Volume:  Normal  Mood:  states she feels better than on admission, less depressed   Affect:  Appropriate and fuller in range  Thought Process:  Linear  Orientation:  Full (Time, Place, and Person)  Thought Content:  no hallucinations, no delusions, not internally preoccupied   Suicidal Thoughts:  No denies any suicidal or self injurious ideations   Homicidal Thoughts:  No denies any homicidal or violent ideations towards anyone   Memory:  recent and remote grossly intact   Judgement:  Other:  improving   Insight:  improving   Psychomotor Activity:  Normal  Concentration:  Good  Recall:  Good   Fund of Knowledge:Good  Language: Good  Akathisia:  Negative  Handed:  Right  AIMS (if indicated):     Assets:  Communication Skills Desire for Improvement Resilience Social Support  Sleep:  Number of Hours: 6.25  Cognition: WNL  ADL's:  Intact   Mental Status Per Nursing Assessment::   On Admission:     Demographic Factors:  21 year old single female, no children, plans to live with mother after discharge  Loss Factors: Recent relationship loss, break up   Historical Factors: Prior psychiatric admissions, last time 2016. History of depression, history of eating disorder   Risk Reduction Factors:   Sense of responsibility to family, Living with another person, especially a relative, Positive social support and Positive coping skills or problem solving skills  Continued Clinical Symptoms:  At this time patient is improved compared to admission - mood is improved, affect is fuller in range, no thought disorder, no suicidal or self injurious ideations, no psychotic symptoms , future oriented. Denies any medication side effects- side effects reviewed , to include risk of increased suicidal ideations early in treatment with antidepressants in young adults, risk of movement disorder , akathisia, metabolic side effects on Abilify Behavior on unit in good control.  Cognitive Features That Contribute To Risk:  No gross cognitive deficits noted upon discharge. Is alert , attentive, and oriented x 3   Suicide Risk:  Mild:  Suicidal ideation of limited frequency, intensity, duration, and specificity.  There are no identifiable plans, no associated intent, mild dysphoria and related symptoms, good self-control (both objective and subjective assessment), few other risk factors,  and identifiable protective factors, including available and accessible social support.  Follow-up Information    [facility]. Go to.   Why:  Appointment on 02/18/17 @1pm  with Darrel HooverKelly Caniglia for therapy.  Appointment on 3/20 @1 :15p for med management with Sanda Lingereresa Frances.  Contact information: [address]  [place], [place] [address] P: [phone] F: [phone]       BEHAVIORAL HEALTH INTENSIVE PSYCH Follow up on 02/21/2017.   Specialty:  Behavioral Health Why:  Assessment appointment for PHP @3pm . Please arrive by 2pm to complete paperwork. Contact information: [address] [id number] [place] [phone]          Plan Of Care/Follow-up recommendations:  Activity:  as tolerated  Diet:  Regular Tests:  NA Other:  See below  Patient is leaving unit in good spirits  Plans to go live with mother Follow up as above   Nehemiah MassedOBOS, Marien Manship, MD 02/13/2017, 10:19 AM

## 2017-02-13 NOTE — BHH Group Notes (Signed)
B[facility] Group Notes:  (Nursing/MHT/Case Management/Adjunct)  Date:  02/13/2017  Time:  0900 am  Type of Therapy:  Psychoeducational Skills  Participation Level:  Did Not Attend  Patient invited; declined to attend.  Cranford MonBeaudry, Mykela Mewborn Evans 02/13/2017, 9:41 AM

## 2017-02-13 NOTE — Progress Notes (Signed)
Pt discharged with the mom. Pt was ambulatory, stable and appreciative at that time. All papers and prescriptions were given and valuables returned. Verbal understanding expressed. Denies SI/HI and A/VH. Pt given opportunity to express concerns and ask questions.

## 2017-02-13 NOTE — Progress Notes (Signed)
  [facility]  Will you be returning to the same living situation after discharge:  Yes,  with parent At discharge, do you have transportation home?: Yes,  parent Do you have the ability to pay for your medications: Yes,  no barriers  Release of information consent forms completed and turned in to Medical Records.  Patient to Follow up at: Follow-up Information    [facility]. Go to.   Why:  Appointment on 02/18/17 @1pm  with Darrel HooverKelly Caniglia for therapy. Appointment on 3/20 @1 :15p for med management with Sanda Lingereresa Frances.  Contact information: [address]  , [place] [address] P: [phone] F: [phone]       BEHAVIORAL HEALTH INTENSIVE PSYCH Follow up on 02/21/2017.   Specialty:  Behavioral Health Why:  Assessment appointment for PHP @3pm . Please arrive by 2pm to complete paperwork. Contact information: [address] [id number] [place] [phone]          Next level of care provider has access to [facility] Link:no  Safety Planning and Suicide Prevention discussed: Yes,  with patient and mother  Have you used any form of tobacco in the last 30 days? (Cigarettes, Smokeless Tobacco, Cigars, and/or Pipes): No  Has patient been referred to the Quitline?: N/A patient is not a smoker  Patient has been referred for addiction treatment: N/A  Danielle Prince 02/13/2017, 1:28 PM

## 2017-02-21 ENCOUNTER — Other Ambulatory Visit (HOSPITAL_COMMUNITY): Payer: BLUE CROSS/BLUE SHIELD

## 2017-03-23 ENCOUNTER — Telehealth (HOSPITAL_COMMUNITY): Payer: Self-pay

## 2017-03-23 NOTE — Telephone Encounter (Signed)
Patient called for a refill on her medication, I explained to her that she is not a patient here and that according to the AVS from her hospital admission she was supposed to follow up at the [facility] where they had made 2 appointments for her for therapy and med management. Patient says she did not go to those appointments and does not have time for doctor visits as she is working 2 jobs. I explained to patient that she can not continue to get medication without seeing a doctor. I advised her to call the [facility]

## 2017-10-28 DIAGNOSIS — N7689 Other specified inflammation of vagina and vulva: Secondary | ICD-10-CM | POA: Diagnosis not present

## 2018-01-05 DIAGNOSIS — Z113 Encounter for screening for infections with a predominantly sexual mode of transmission: Secondary | ICD-10-CM | POA: Diagnosis not present

## 2018-01-05 DIAGNOSIS — N390 Urinary tract infection, site not specified: Secondary | ICD-10-CM | POA: Diagnosis not present

## 2018-02-27 ENCOUNTER — Emergency Department (HOSPITAL_COMMUNITY)
Admission: EM | Admit: 2018-02-27 | Discharge: 2018-02-27 | Disposition: A | Discharge status: Home / Self Care | Payer: BLUE CROSS/BLUE SHIELD | Attending: Emergency Medicine | Admitting: Emergency Medicine

## 2018-02-27 ENCOUNTER — Encounter (HOSPITAL_COMMUNITY): Payer: Self-pay

## 2018-02-27 DIAGNOSIS — Y999 Unspecified external cause status: Secondary | ICD-10-CM | POA: Insufficient documentation

## 2018-02-27 DIAGNOSIS — W1830XA Fall on same level, unspecified, initial encounter: Secondary | ICD-10-CM | POA: Insufficient documentation

## 2018-02-27 DIAGNOSIS — Z79899 Other long term (current) drug therapy: Secondary | ICD-10-CM | POA: Diagnosis not present

## 2018-02-27 DIAGNOSIS — Y939 Activity, unspecified: Secondary | ICD-10-CM | POA: Insufficient documentation

## 2018-02-27 DIAGNOSIS — Y929 Unspecified place or not applicable: Secondary | ICD-10-CM | POA: Insufficient documentation

## 2018-02-27 DIAGNOSIS — S0990XA Unspecified injury of head, initial encounter: Secondary | ICD-10-CM | POA: Insufficient documentation

## 2018-02-27 DIAGNOSIS — Z87891 Personal history of nicotine dependence: Secondary | ICD-10-CM | POA: Diagnosis not present

## 2018-02-27 DIAGNOSIS — S098XXA Other specified injuries of head, initial encounter: Secondary | ICD-10-CM | POA: Diagnosis not present

## 2018-02-27 NOTE — ED Notes (Signed)
Pt left without signing for discharge. 

## 2018-02-27 NOTE — ED Notes (Signed)
Pt. Seen leaving the room with mother by this NT. RN,Lisa made aware.
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
## 2018-02-27 NOTE — ED Notes (Signed)
Pt and pt's mother walked out of room quickly and proceed to exit and left. RN and PA notified.
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
## 2018-02-27 NOTE — ED Notes (Signed)
Pt's mom told Clinical research associatewriter that pt wanted to leave and pt was not staying. Writer informed pt's mother that staff could not hold her down or keep her at the hospital. RN and PA notified.
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
## 2018-02-28 ENCOUNTER — Other Ambulatory Visit: Payer: Self-pay

## 2018-02-28 ENCOUNTER — Emergency Department (HOSPITAL_COMMUNITY)
Admission: EM | Admit: 2018-02-28 | Discharge: 2018-03-01 | Disposition: A | Discharge status: Home / Self Care | Payer: BLUE CROSS/BLUE SHIELD | Attending: Emergency Medicine | Admitting: Emergency Medicine

## 2018-02-28 ENCOUNTER — Encounter (HOSPITAL_COMMUNITY): Payer: Self-pay | Admitting: Nurse Practitioner

## 2018-02-28 ENCOUNTER — Emergency Department (HOSPITAL_COMMUNITY)
Admission: EM | Admit: 2018-02-28 | Discharge: 2018-02-28 | Disposition: A | Discharge status: Home / Self Care | Payer: BLUE CROSS/BLUE SHIELD | Source: Home / Self Care

## 2018-02-28 ENCOUNTER — Emergency Department (HOSPITAL_COMMUNITY): Payer: BLUE CROSS/BLUE SHIELD

## 2018-02-28 DIAGNOSIS — O26841 Uterine size-date discrepancy, first trimester: Secondary | ICD-10-CM | POA: Diagnosis not present

## 2018-02-28 DIAGNOSIS — Z008 Encounter for other general examination: Secondary | ICD-10-CM

## 2018-02-28 DIAGNOSIS — Z79899 Other long term (current) drug therapy: Secondary | ICD-10-CM | POA: Diagnosis not present

## 2018-02-28 DIAGNOSIS — Z046 Encounter for general psychiatric examination, requested by authority: Secondary | ICD-10-CM | POA: Insufficient documentation

## 2018-02-28 DIAGNOSIS — O26891 Other specified pregnancy related conditions, first trimester: Secondary | ICD-10-CM | POA: Insufficient documentation

## 2018-02-28 DIAGNOSIS — O9989 Other specified diseases and conditions complicating pregnancy, childbirth and the puerperium: Secondary | ICD-10-CM | POA: Diagnosis not present

## 2018-02-28 DIAGNOSIS — F329 Major depressive disorder, single episode, unspecified: Secondary | ICD-10-CM | POA: Diagnosis not present

## 2018-02-28 DIAGNOSIS — Z87891 Personal history of nicotine dependence: Secondary | ICD-10-CM | POA: Insufficient documentation

## 2018-02-28 DIAGNOSIS — F339 Major depressive disorder, recurrent, unspecified: Secondary | ICD-10-CM | POA: Diagnosis present

## 2018-02-28 DIAGNOSIS — F23 Brief psychotic disorder: Secondary | ICD-10-CM | POA: Insufficient documentation

## 2018-02-28 DIAGNOSIS — R45851 Suicidal ideations: Secondary | ICD-10-CM

## 2018-02-28 DIAGNOSIS — O99341 Other mental disorders complicating pregnancy, first trimester: Secondary | ICD-10-CM | POA: Insufficient documentation

## 2018-02-28 DIAGNOSIS — Z3A01 Less than 8 weeks gestation of pregnancy: Secondary | ICD-10-CM

## 2018-02-28 DIAGNOSIS — F1520 Other stimulant dependence, uncomplicated: Secondary | ICD-10-CM | POA: Insufficient documentation

## 2018-02-28 DIAGNOSIS — Z813 Family history of other psychoactive substance abuse and dependence: Secondary | ICD-10-CM | POA: Diagnosis not present

## 2018-02-28 DIAGNOSIS — Z818 Family history of other mental and behavioral disorders: Secondary | ICD-10-CM | POA: Diagnosis not present

## 2018-02-28 LAB — CBC
HEMATOCRIT: 35.1 % — AB (ref 36.0–46.0)
Hemoglobin: 11.8 g/dL — ABNORMAL LOW (ref 12.0–15.0)
MCH: 29.1 pg (ref 26.0–34.0)
MCHC: 33.6 g/dL (ref 30.0–36.0)
MCV: 86.5 fL (ref 78.0–100.0)
PLATELETS: 286 10*3/uL (ref 150–400)
RBC: 4.06 MIL/uL (ref 3.87–5.11)
RDW: 13.6 % (ref 11.5–15.5)
WBC: 8.6 10*3/uL (ref 4.0–10.5)

## 2018-02-28 LAB — COMPREHENSIVE METABOLIC PANEL
ALT: 13 U/L — ABNORMAL LOW (ref 14–54)
ANION GAP: 8 (ref 5–15)
AST: 18 U/L (ref 15–41)
Albumin: 3.4 g/dL — ABNORMAL LOW (ref 3.5–5.0)
Alkaline Phosphatase: 51 U/L (ref 38–126)
BILIRUBIN TOTAL: 0.5 mg/dL (ref 0.3–1.2)
BUN: 11 mg/dL (ref 6–20)
CALCIUM: 9.1 mg/dL (ref 8.9–10.3)
CO2: 25 mmol/L (ref 22–32)
Chloride: 104 mmol/L (ref 101–111)
Creatinine, Ser: 0.69 mg/dL (ref 0.44–1.00)
GFR calc Af Amer: >60 mL/min (ref >60)
Glucose, Bld: 104 mg/dL — ABNORMAL HIGH (ref 65–99)
POTASSIUM: 3.7 mmol/L (ref 3.5–5.1)
Sodium: 137 mmol/L (ref 135–145)
TOTAL PROTEIN: 6.5 g/dL (ref 6.5–8.1)

## 2018-02-28 LAB — I-STAT BETA HCG BLOOD, ED (MC, WL, AP ONLY): I-stat hCG, quantitative: >2000 m[IU]/mL — ABNORMAL HIGH (ref <5)

## 2018-02-28 LAB — SALICYLATE LEVEL: Salicylate Lvl: <7 mg/dL (ref 2.8–30.0)

## 2018-02-28 LAB — HCG, QUANTITATIVE, PREGNANCY: hCG, Beta Chain, Quant, S: 202450 m[IU]/mL — ABNORMAL HIGH (ref <5)

## 2018-02-28 LAB — ACETAMINOPHEN LEVEL: Acetaminophen (Tylenol), Serum: <10 ug/mL — ABNORMAL LOW (ref 10–30)

## 2018-02-28 LAB — ETHANOL

## 2018-02-28 MED ORDER — COMPLETENATE 29-1 MG PO CHEW: 1.0000 | CHEWABLE_TABLET | Freq: Every day | ORAL | Status: DC

## 2018-02-28 MED ORDER — COMPLETENATE 29-1 MG PO CHEW
1.0000 | CHEWABLE_TABLET | Freq: Every day | ORAL | Status: DC
  Administered 2018-02-28 – 2018-03-01 (×2): 1 via ORAL
  Filled 2018-02-28 (×2): qty 1

## 2018-02-28 MED ORDER — ARIPIPRAZOLE 2 MG PO TABS
2.0000 mg | ORAL_TABLET | Freq: Every day | ORAL | Status: DC
  Filled 2018-02-28: qty 1

## 2018-02-28 MED ORDER — ACETAMINOPHEN 325 MG PO TABS: 650.0000 mg | ORAL_TABLET | ORAL | Status: DC | PRN

## 2018-02-28 NOTE — ED Notes (Signed)
US completed

## 2018-02-28 NOTE — ED Triage Notes (Signed)
Patient brought in by [facility] and [facility] from [facility] for SI and not taking medications. Patient believes everyone is mocking her and talking about her. Patient sneaks out to use drugs.

## 2018-02-28 NOTE — ED Provider Notes (Signed)
[facility]-EMER[facility] DEPT Provider Note   CSN: [id number] Arrival date & time: 02/28/18  1707     History   Chief Complaint No chief complaint on file.   HPI Danielle Prince is a 22 y.o. female with history of ADHD, anorexia with bulimia, anxiety, overdose, depression who presents from [facility] to the emergency department today for suicidal ideation.  Patient is currently a Transport planner.  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  She denies any other drug use or alcohol use.  She states that she has been more depressed recently stating "I just want my boyfriend back".  She admits to suicidal ideation but will not state if she has a plan.  She denies attempts.  No homicidal ideation.  Patient is reportedly pregnant.  She states her last menstrual cycle was on 01/08/2018.  She denies any associated abdominal pain or vaginal bleeding.  Patient denies any physical complaints at this time.  HPI  Past Medical History:  Diagnosis Date  . ADHD (attention deficit hyperactivity disorder)   . ADHD (attention deficit hyperactivity disorder)   . Anorexia nervosa with bulimia   . Anxiety   . Aspirin overdose 02/2012  . Depression   . Dysrhythmia   . Eating disorder   . Electrolyte disturbance 2010, 2012   hypokalemia, hypochloremia.  . Urinary tract infection   . Varicella   . Vision abnormalities     Patient Active Problem List   Diagnosis Date Noted  . MDD (major depressive disorder), recurrent episode (HCC) 02/09/2017  . Dyspareunia 04/15/2014  . Esophagitis, acute 10/28/2013  . Candidiasis of vulva and vagina 08/28/2013  . Vaginitis and vulvovaginitis, unspecified 06/26/2013  . Generalized anxiety disorder 03/02/2012  . Oppositional defiant disorder 03/02/2012  . Bulimia nervosa 02/26/2012  . Severe episode of recurrent major depressive disorder, without psychotic features (HCC) 02/26/2012  . Urinary tract infection    . Vision abnormalities     Past Surgical History:  Procedure Laterality Date  . COLONOSCOPY WITH ESOPHAGOGASTRODUODENOSCOPY (EGD) N/A 10/28/2013   Procedure: COLONOSCOPY WITH ESOPHAGOGASTRODUODENOSCOPY (EGD);  Surgeon: Beverley Fiedler, MD;  Location: [facility] ENDOSCOPY;  Service: Endoscopy;  Laterality: N/A;  . GUM SURGERY    . INDUCED ABORTION      OB History    Gravida Para Term Preterm AB Living   1 0 0 0 1 0   SAB TAB Ectopic Multiple Live Births   0 1 0 0         Home Medications    Prior to Admission medications   Medication Sig Start Date End Date Taking? Authorizing Provider  ARIPiprazole (ABILIFY) 2 MG tablet Take 1 tablet (2 mg total) by mouth daily. Patient not taking: Reported on 02/28/2018 02/14/17   Rankin, Shuvon B, NP  escitalopram (LEXAPRO) 20 MG tablet Take 20 mg by mouth every morning. 01/01/18   [provider]  escitalopram (LEXAPRO) 5 MG tablet Take 1 tablet (5 mg total) by mouth daily. Patient not taking: Reported on 02/28/2018 02/14/17   Rankin, Shuvon B, NP  gabapentin (NEURONTIN) 300 MG capsule Take 1 capsule (300 mg total) by mouth 3 (three) times daily. Patient not taking: Reported on 02/28/2018 02/13/17   Rankin, Denice Bors B, NP  Norgestimate-Ethinyl Estradiol Triphasic 0.18/0.215/0.25 MG-35 MCG tablet TAKE 1 TABLET DAILY Patient not taking: No sig reported 03/14/16   Brock Bad, MD  traZODone (DESYREL) 50 MG tablet Take 1 tablet (50 mg total) by mouth  at bedtime and may repeat dose one time if needed. Patient not taking: Reported on 02/28/2018 02/13/17   Rankin, Denice Bors B, NP    Family History Family History  Problem Relation Age of Onset  . Cancer Father   . Depression Sister   . Depression Paternal Aunt   . Drug abuse Maternal Uncle        deceased  . Drug abuse Paternal Aunt   . Colon cancer Maternal Grandmother     Social History Social History   Tobacco Use  . Smoking status: Former Smoker    Packs/day: 0.50    Types: Cigarettes     Last attempt to quit: 06/25/2013    Years since quitting: 4.6  . Smokeless tobacco: Never Used  Substance Use Topics  . Alcohol use: Yes    Alcohol/week: 0.0 oz    Comment: occasioally   . Drug use: No    Comment: 3x a week     Allergies   Patient has no known allergies.   Review of Systems Review of Systems  All other systems reviewed and are negative.    Physical Exam Updated Vital Signs BP (!) 102/48 (BP Location: Right Arm)   Pulse 72   Temp 98.7 F (37.1 C) (Oral)   Resp 16   SpO2 98%   Physical Exam  Constitutional: She appears well-developed and well-nourished.  HENT:  Head: Normocephalic and atraumatic.  Right Ear: External ear normal.  Left Ear: External ear normal.  Nose: Nose normal.  Mouth/Throat: Uvula is midline, oropharynx is clear and moist and mucous membranes are normal. No tonsillar exudate.  Eyes: Pupils are equal, round, and reactive to light. Right eye exhibits no discharge. Left eye exhibits no discharge. No scleral icterus.  Neck: Trachea normal. Neck supple. No spinous process tenderness present. No neck rigidity. Normal range of motion present.  Cardiovascular: Normal rate, regular rhythm and intact distal pulses.  No murmur heard. Pulses:      Radial pulses are 2+ on the right side, and 2+ on the left side.       Dorsalis pedis pulses are 2+ on the right side, and 2+ on the left side.       Posterior tibial pulses are 2+ on the right side, and 2+ on the left side.  No lower extremity swelling or edema. Calves symmetric in size bilaterally.  Pulmonary/Chest: Effort normal and breath sounds normal. She exhibits no tenderness.  Abdominal: Soft. Bowel sounds are normal. There is no tenderness. There is no rigidity, no rebound, no guarding and no CVA tenderness.  Musculoskeletal: She exhibits no edema.  Lymphadenopathy:    She has no cervical adenopathy.  Neurological: She is alert.  Skin: Skin is warm and dry. No rash noted. She is not  diaphoretic.  Psychiatric:  Very flat affect.  Nursing note and vitals reviewed.    ED Treatments / Results  Labs (all labs ordered are listed, but only abnormal results are displayed) Labs Reviewed  COMPREHENSIVE METABOLIC PANEL - Abnormal; Notable for the following components:      Result Value   Glucose, Bld 104 (*)    Albumin 3.4 (*)    ALT 13 (*)    All other components within normal limits  ACETAMINOPHEN LEVEL - Abnormal; Notable for the following components:   Acetaminophen (Tylenol), Serum <10 (*)    All other components within normal limits  CBC - Abnormal; Notable for the following components:   Hemoglobin 11.8 (*)  HCT 35.1 (*)    All other components within normal limits  HCG, QUANTITATIVE, PREGNANCY - Abnormal; Notable for the following components:   hCG, Beta Chain, Quant, S 202,450 (*)    All other components within normal limits  I-STAT BETA HCG BLOOD, ED ([facility], [facility], [facility] ONLY) - Abnormal; Notable for the following components:   I-stat hCG, quantitative >2,000.0 (*)    All other components within normal limits  ETHANOL  SALICYLATE LEVEL  RAPID URINE DRUG SCREEN, HOSP PERFORMED  URINALYSIS, ROUTINE W REFLEX MICROSCOPIC  I-STAT BETA HCG BLOOD, ED ([facility], [facility], [facility] ONLY)    EKG  EKG Interpretation None       Radiology [place] Ob Comp < 14 Wks  Result Date: 02/28/2018 CLINICAL DATA:  22 year old pregnant female presenting for evaluation of pregnancy and size and dating. LMP: 01/08/2018 corresponding to an estimated gestational age of [redacted] weeks, 2 days. EXAM: OBSTETRIC <14 WK ULTRASOUND TECHNIQUE: Transabdominal ultrasound was performed for evaluation of the gestation as well as the maternal uterus and adnexal regions. COMPARISON:  None. FINDINGS: Intrauterine gestational sac: Single intrauterine gestational sac. Yolk sac:  Seen Embryo:  Present Cardiac Activity: Detected Heart Rate: 153 bpm CRL: 14 mm   7 w 5 d                  US EDC: 10/12/2018 Subchorionic hemorrhage:   None visualized. Maternal uterus/adnexae: The uterus is anteverted. The ovaries are not visualized. IMPRESSION: Single live intrauterine pregnancy with an estimated gestational age of [redacted] weeks, 5 days. Electronically Signed   By: Elgie CollardArash  Radparvar M.D.   On: 02/28/2018 20:02    Procedures Procedures (including critical care time)  Medications Ordered in ED Medications  prenatal vitamin w/FE, FA (NATACHEW) chewable tablet 1 tablet (1 tablet Oral Given 02/28/18 2103)  acetaminophen (TYLENOL) tablet 650 mg (not administered)  ARIPiprazole (ABILIFY) tablet 2 mg (not administered)     Initial Impression / Assessment and Plan / ED Course  I have reviewed the triage vital signs and the nursing notes.  Pertinent labs & imaging results that were available during my care of the patient were reviewed by me and considered in my medical decision making (see chart for details).      22 y.o. female with history of ADHD, anorexia with bulimia, anxiety, overdose, depression who [facility] department today for suicidal ideation.  Patient is currently a [employer].  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  She denies any other drug use or alcohol use.  She states that she has been more depressed recently stating "I just want my boyfriend back".  She admits to suicidal ideation but will not state if she has a plan.  She denies suicidal attempts.  No homicidal ideation.  Patient is reportedly pregnant.  She states her last menstrual cycle was on 01/08/2018.  She denies any associated abdominal pain or vaginal bleeding.  Patient denies any physical complaints at this time. Patient is on only prenatal vitamins.  Patient's exam is reassuring as above.  There is no abdominal tenderness to palpation.  Patient's i-STAT beta hCG was greater than 2000.  A quantitative was ordered and shows a hCG of 202,450.  OB ultrasound was performed and shows a  single live intrauterine pregnancy with an estimated gestational age of [redacted] weeks and 5 days the heart rate of 153.  Labs are otherwise reassuring.  Patient's vital signs are reassuring as  well.  She is medically cleared.  Will start the patient on prenatal vitamin.  Consult to TTS pending.   Spoke with pharmacist Fayrene Fearing in regards to home medication and patient being pregnant. Okay to reorder Abilify with the patient being pregnant. Asked to hold Trazodone, Gabapentin and Lexapro. Abilify was re-ordered. Patient moved to psych hold with TTS consult pending. .  TTS recommends inpatient treatment.  Final Clinical Impressions(s) / ED Diagnoses   Final diagnoses:  Medical clearance for psychiatric admission  Less than [redacted] weeks gestation of pregnancy  Suicidal ideation    ED Discharge Orders    None       Princella Pellegrini 02/28/18 2329    Shaune Pollack, MD 03/01/18 [phone]

## 2018-02-28 NOTE — ED Notes (Signed)
Per registration, [facility] reports patient has a bed at [facility] and is being taken there at this time.

## 2018-02-28 NOTE — ED Notes (Signed)
US in progress

## 2018-02-28 NOTE — ED Notes (Signed)
TTS complete 

## 2018-02-28 NOTE — ED Notes (Signed)
Pt presents from [facility] under [facility], pt diagnosed pos. Pregnancy yesterday.  Pt paranoid that everyone is mocking her and talking about her. Pt very irritable, labile and angry with staff.  Not forthcoming with information.  Monitoring for safety, Q 15 min checks in effect.

## 2018-02-28 NOTE — ED Notes (Signed)
Bed: [id number] Expected date:  Expected time:  Means of arrival:  Comments: [facility]-IVC-delusional/?preg

## 2018-02-28 NOTE — BH Assessment (Addendum)
Assessment Note  Danielle Prince is an 22 y.o. female. She came in under IVC after displaying paranoid behavior and making suicidal statements to her family.  The pt's sister stated the pt has not made any suicidal plans, but expresses SI.  The pt stated she is not currently suicidal.  The pt has been hospitalized for SI in 2018 and in 2013 for overdosing according to previous records.  The pt refused to share any information about her past SI.  She reported that people are mocking her and trying to do things to her.  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  At this time her UDS has not been completed.  The pt is currently not seeing a psychiatrist or a Veterinary surgeon.  She reported the last time she went to a counselor was in 2018.  She denies depressive symptoms and stated she is eating and sleeping well.  The pt was guarded through out the assessment.  For the majority of the assessment, she had her face away from the counselor.  When asked if she could face the counselor, the pt stated no.  Most of her answers to questions were, "I don't know" or "I don't want to talk about it".  According to past hospital records, the pt was bulimic when she was 15 and hospitalized due to bulimia.  The pt denies having any issues with eating currently.  When asked who she is living with, she stated she didn't know.  Last night the pt stayed with her aunt but reported she can not go back there.  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 ADHD (attention deficit hyperactivity disorder)   . ADHD (attention deficit hyperactivity disorder)   . Anorexia nervosa with bulimia   . Anxiety   . Aspirin overdose 02/2012  . Depression   . Dysrhythmia   . Eating disorder   . Electrolyte disturbance 2010,  2012   hypokalemia, hypochloremia.  . Urinary tract infection   . Varicella   . Vision abnormalities     Past Surgical History:  Procedure Laterality Date  . COLONOSCOPY WITH ESOPHAGOGASTRODUODENOSCOPY (EGD) N/A 10/28/2013   Procedure: COLONOSCOPY WITH ESOPHAGOGASTRODUODENOSCOPY (EGD);  Surgeon: Beverley Fiedler, MD;  Location: [facility] ENDOSCOPY;  Service: Endoscopy;  Laterality: N/A;  . GUM SURGERY    . INDUCED ABORTION      Family History:  Family History  Problem Relation Age of Onset  . Cancer Father   . Depression Sister   . Depression Paternal Aunt   . Drug abuse Maternal Uncle        deceased  . Drug abuse Paternal Aunt   . Colon cancer Maternal Grandmother     Social History:  reports that she quit smoking about 4 years ago. Her smoking use included cigarettes. She smoked 0.50 packs per day. she has never used smokeless tobacco. She reports that she drinks alcohol. She reports that she does not use drugs.  Additional Social History:  Alcohol / Drug Use Pain Medications: See MAR Prescriptions: See MAR Over the Counter: See MAR History of alcohol / drug use?: Yes Longest period of sobriety (when/how long): unknown Substance #1 Name of Substance 1: crystal meth 1 - Age of First Use: 20 1 - Amount (size/oz): unknown 1 - Frequency: unable  to assess 1 - Duration: 1 year 1 - Last Use / Amount: 02/24/2018  CIWA: CIWA-Ar BP: 111/76 COWS:    Allergies: No Known Allergies  Home Medications:  (Not in a hospital admission)  OB/GYN Status:  No LMP recorded.  General Assessment Data [facility] of Assessment: [facility] TTS Assessment: In system Is this a Tele or Face-to-Face Assessment?: Face-to-Face Is this an Initial Assessment or a Re-assessment for this encounter?: Initial Assessment Marital status: Single Maiden name: Ignacia PalmaDavidson Is patient pregnant?: Other (Comment)(Pt thinks she is pregnant, but this has not been confirmed) Pregnancy Status: Other (Comment)(Pt thinks she is  pregnant but this has not been confirmed) Living Arrangements: Other (Comment)(stated "I don't know" stayed with aunt last night) Can pt return to current living arrangement?: (unknown) Admission Status: Involuntary Is patient capable of signing voluntary admission?: No(IVC) Referral Source: Self/Family/Friend Insurance type: BCBS     Crisis Care Plan Living Arrangements: Other (Comment)(stated "I don't know" stayed with aunt last night) Legal Guardian: Other:(Self) Name of Psychiatrist: none Name of Therapist: none  Education Status Is patient currently in school?: No Is the patient employed, unemployed or receiving disability?: Unemployed  Risk to self with the past 6 months Suicidal Ideation: No Has patient been a risk to self within the past 6 months prior to admission? : Yes Suicidal Intent: No Has patient had any suicidal intent within the past 6 months prior to admission? : Other (comment)(pt denies, but pt sister says the pt has expressed SI) Is patient at risk for suicide?: Yes Suicidal Plan?: No Has patient had any suicidal plan within the past 6 months prior to admission? : No Access to Means: No What has been your use of drugs/alcohol within the last 12 months?: crystal meth Previous Attempts/Gestures: Yes How many times?: 1 Other Self Harm Risks: crystal meth usage Triggers for Past Attempts: Unknown Intentional Self Injurious Behavior: None Family Suicide History: Unknown Recent stressful life event(s): Other (Comment)(delusions that people are talking about her) Persecutory voices/beliefs?: No Depression: No Depression Symptoms: Feeling angry/irritable Substance abuse history and/or treatment for substance abuse?: Yes Suicide prevention information given to non-admitted patients: Not applicable  Risk to Others within the past 6 months Homicidal Ideation: No Does patient have any lifetime risk of violence toward others beyond the six months prior to  admission? : No Thoughts of Harm to Others: No Current Homicidal Intent: No Current Homicidal Plan: No Access to Homicidal Means: No Identified Victim: none History of harm to others?: No Assessment of Violence: None Noted Violent Behavior Description: none Does patient have access to weapons?: No Criminal Charges Pending?: No Does patient have a court date: No Is patient on probation?: No  Psychosis Hallucinations: None noted Delusions: Persecutory  Mental Status Report Appearance/Hygiene: In scrubs, Unremarkable Eye Contact: Poor Motor Activity: Freedom of movement Speech: Argumentative(guarded) Level of Consciousness: Irritable Mood: Irritable Affect: Irritable Anxiety Level: None Thought Processes: Coherent, Relevant Judgement: Partial Orientation: Person, Place, Time, Situation, Appropriate for developmental age Obsessive Compulsive Thoughts/Behaviors: None  Cognitive Functioning Concentration: Normal Memory: Recent Intact, Remote Intact Is patient IDD: No Is patient DD?: No Insight: Poor Impulse Control: Poor Appetite: Fair Have you had any weight changes? : No Change Sleep: No Change Total Hours of Sleep: 8 Vegetative Symptoms: None  ADLScreening [facility] Assessment Services) Patient's cognitive ability adequate to safely complete daily activities?: Yes Patient able to express need for assistance with ADLs?: Yes Independently performs ADLs?: Yes (appropriate for developmental age)  Prior Inpatient Therapy Prior Inpatient Therapy:  Yes Prior Therapy Dates: 2018, 2013 Prior Therapy Facilty/Provider(s): [facility] Reason for Treatment: SI and OD  Prior Outpatient Therapy Prior Outpatient Therapy: Yes Prior Therapy Dates: 2018 Prior Therapy Facilty/Provider(s): [facility] Reason for Treatment: pt refused to answer Does patient have an ACCT team?: No Does patient have Intensive In-House Services?  : No Does patient have [facility] services? : No Does patient  have P4CC services?: No  ADL Screening (condition at time of admission) Patient's cognitive ability adequate to safely complete daily activities?: Yes Patient able to express need for assistance with ADLs?: Yes Independently performs ADLs?: Yes (appropriate for developmental age)       Abuse/Neglect Assessment (Assessment to be complete while patient is alone) Abuse/Neglect Assessment Can Be Completed: Yes Physical Abuse: Denies Verbal Abuse: Denies Sexual Abuse: Denies Exploitation of patient/patient's resources: Denies Self-Neglect: Denies Values / Beliefs Cultural Requests During Hospitalization: None Spiritual Requests During Hospitalization: None Consults Spiritual Care Consult Needed: No Social Work Consult Needed: No      Additional Information 1:1 In Past 12 Months?: No CIRT Risk: No Elopement Risk: No Does patient have medical clearance?: Yes     Disposition:  Disposition Initial Assessment Completed for this Encounter: Yes Disposition of Patient: Admit Type of inpatient treatment program: Adult Mode of transportation if patient is discharged?: N/A   PA Donell Sievert recommends inpatient treatment for the pt.  RN Rashell was notified  On Site Evaluation by:   Reviewed with Physician:    Ottis Stain 02/28/2018 8:42 PM

## 2018-02-28 NOTE — ED Notes (Signed)
Reports given to Hansel StarlingLatricia London, RN in acute area.

## 2018-02-28 NOTE — ED Notes (Signed)
Patient reports SI no plan and denies HI and AVH. Plan of care discussed. Encouragement and support provided and safety maintain. 1:1 monitoring remain in place.

## 2018-03-01 DIAGNOSIS — F329 Major depressive disorder, single episode, unspecified: Secondary | ICD-10-CM | POA: Diagnosis not present

## 2018-03-01 DIAGNOSIS — Z818 Family history of other mental and behavioral disorders: Secondary | ICD-10-CM | POA: Diagnosis not present

## 2018-03-01 DIAGNOSIS — Z813 Family history of other psychoactive substance abuse and dependence: Secondary | ICD-10-CM | POA: Diagnosis not present

## 2018-03-01 DIAGNOSIS — Z87891 Personal history of nicotine dependence: Secondary | ICD-10-CM

## 2018-03-01 DIAGNOSIS — F32A Depression, unspecified: Secondary | ICD-10-CM | POA: Insufficient documentation

## 2018-03-01 LAB — URINALYSIS, ROUTINE W REFLEX MICROSCOPIC
BILIRUBIN URINE: NEGATIVE
GLUCOSE, UA: NEGATIVE mg/dL
Hgb urine dipstick: NEGATIVE
KETONES UR: NEGATIVE mg/dL
Nitrite: NEGATIVE
PH: 7 (ref 5.0–8.0)
PROTEIN: NEGATIVE mg/dL
Specific Gravity, Urine: 1.016 (ref 1.005–1.030)

## 2018-03-01 LAB — RAPID URINE DRUG SCREEN, HOSP PERFORMED
Amphetamines: POSITIVE — AB
BENZODIAZEPINES: NOT DETECTED
Barbiturates: NOT DETECTED
COCAINE: NOT DETECTED
OPIATES: NOT DETECTED
Tetrahydrocannabinol: NOT DETECTED

## 2018-03-01 MED ORDER — QUETIAPINE FUMARATE 50 MG PO TABS: 50.0000 mg | ORAL_TABLET | Freq: Three times a day (TID) | ORAL | Status: DC | PRN

## 2018-03-01 NOTE — Discharge Instructions (Signed)
For your behavioral health needs, you are advised to follow up with [facility].  Contact them at your earliest opportunity to ask about scheduling an intake appointment: ° °     [facility] °     [address] °     [place], [place] [address] °     [phone] °

## 2018-03-01 NOTE — ED Notes (Signed)
Case manger at bedside.

## 2018-03-01 NOTE — Consult Note (Addendum)
[facility] Consult   Reason for Consult:  Suicidal ideation Referring Physician:  EDP Patient Identification: Magdalyn Prince MRN:  [id number] Principal Diagnosis: MDD (major depressive disorder), recurrent episode ([place]) Diagnosis:   Patient Active Problem List   Diagnosis Date Noted  . MDD (major depressive disorder), recurrent episode ([place]) [F33.9] 02/09/2017  . Dyspareunia [IMO0002] 04/15/2014  . Esophagitis, acute [K20.9] 10/28/2013  . Candidiasis of vulva and vagina [B37.3] 08/28/2013  . Vaginitis and vulvovaginitis, unspecified [N76.0] 06/26/2013  . Generalized anxiety disorder [F41.1] 03/02/2012  . Oppositional defiant disorder [F91.3] 03/02/2012  . Bulimia nervosa [F50.2] 02/26/2012  . Severe episode of recurrent major depressive disorder, without psychotic features ([place]) [F33.2] 02/26/2012  . Urinary tract infection [N39.0]   . Vision abnormalities [H53.9]     Total Time spent with patient: 45 minutes  Subjective:   Danielle Prince is a 22 y.o. female patient admitted with suicidal ideation.  HPI:  Pt was seen and chart reviewed with treatment team and Dr Danielle Prince. Pt denies suicidal/homicidal ideation, denies auditory/visual hallucinations and does not appear to be responding to internal stimuli. Pt stated she became suicidal when she was at [facility] and she was IVC'd and taken to the [facility]. Pt stated she uses amphetamines and sneaks out of the house to get drugs. Pt found out yesterday that she is estimated to be 7 weeks and 5 days pregnant. Pt will be given resources for outpatient PCP and OB/GYN. Pt will be instructed to follow up with [facility] for medication management and therapy. Pt is currently living with her mother and will return there upon discharge. Pt is psychiatrically clear for discharge.   Past Psychiatric History: As above  Risk to Self: None Risk to Others: None Prior Inpatient Therapy: Prior Inpatient Therapy: Yes Prior Therapy  Dates: 2018, 2013 Prior Therapy Facilty/Provider(s): [facility] Reason for Treatment: SI and OD Prior Outpatient Therapy: Prior Outpatient Therapy: Yes Prior Therapy Dates: 2018 Prior Therapy Facilty/Provider(s): [facility] Reason for Treatment: pt refused to answer Does patient have an ACCT team?: No Does patient have Intensive In-House Services?  : No Does patient have [facility] services? : No Does patient have P4CC services?: No  Past Medical History:  Past Medical History:  Diagnosis Date  . ADHD (attention deficit hyperactivity disorder)   . ADHD (attention deficit hyperactivity disorder)   . Anorexia nervosa with bulimia   . Anxiety   . Aspirin overdose 02/2012  . Depression   . Dysrhythmia   . Eating disorder   . Electrolyte disturbance 2010, 2012   hypokalemia, hypochloremia.  . Urinary tract infection   . Varicella   . Vision abnormalities     Past Surgical History:  Procedure Laterality Date  . COLONOSCOPY WITH ESOPHAGOGASTRODUODENOSCOPY (EGD) N/A 10/28/2013   Procedure: COLONOSCOPY WITH ESOPHAGOGASTRODUODENOSCOPY (EGD);  Surgeon: Jerene Bears, MD;  Location: [facility] ENDOSCOPY;  Service: Endoscopy;  Laterality: N/A;  . GUM SURGERY    . INDUCED ABORTION     Family History:  Family History  Problem Relation Age of Onset  . Cancer Father   . Depression Sister   . Depression Paternal Aunt   . Drug abuse Maternal Uncle        deceased  . Drug abuse Paternal Aunt   . Colon cancer Maternal Grandmother    Family Psychiatric  History: As listed above.  Social History:  Social History   Substance and Sexual Activity  Alcohol Use Yes  . Alcohol/week: 0.0 oz   Comment: occasioally  Social History   Substance and Sexual Activity  Drug Use No   Comment: 3x a week    Social History   Socioeconomic History  . Marital status: Single    Spouse name: None  . Number of children: None  . Years of education: None  . Highest education level: None  Social Needs  .  Financial resource strain: None  . Food insecurity - worry: None  . Food insecurity - inability: None  . Transportation needs - medical: None  . Transportation needs - non-medical: None  Occupational History  . Occupation: Conservation officer, [employer]  Tobacco Use  . Smoking status: Former Smoker    Packs/day: 0.50    Types: Cigarettes    Last attempt to quit: 06/25/2013    Years since quitting: 4.6  . Smokeless tobacco: Never Used  Substance and Sexual Activity  . Alcohol use: Yes    Alcohol/week: 0.0 oz    Comment: occasioally   . Drug use: No    Comment: 3x a week  . Sexual activity: Yes    Partners: Male    Birth control/protection: OCP, Pill  Other Topics Concern  . None  Social History Narrative  . None   Additional Social History: N/A    Allergies:  No Known Allergies  Labs:  Results for orders placed or performed during the hospital encounter of 02/28/18 (from the past 48 hour(s))  Comprehensive metabolic panel     Status: Abnormal   Collection Time: 02/28/18  6:20 PM  Result Value Ref Range   Sodium 137 135 - 145 mmol/L   Potassium 3.7 3.5 - 5.1 mmol/L   Chloride 104 101 - 111 mmol/L   CO2 25 22 - 32 mmol/L   Glucose, Bld 104 (H) 65 - 99 mg/dL   BUN 11 6 - 20 mg/dL   Creatinine, Ser 0.69 0.44 - 1.00 mg/dL   Calcium 9.1 8.9 - 10.3 mg/dL   Total Protein 6.5 6.5 - 8.1 g/dL   Albumin 3.4 (L) 3.5 - 5.0 g/dL   AST 18 15 - 41 U/L   ALT 13 (L) 14 - 54 U/L   Alkaline Phosphatase 51 38 - 126 U/L   Total Bilirubin 0.5 0.3 - 1.2 mg/dL   GFR calc non Af Amer >60 >60 mL/min   GFR calc Af Amer >60 >60 mL/min    Comment: (NOTE) The eGFR has been calculated using the CKD EPI equation. This calculation has not been validated in all clinical situations. eGFR's persistently <60 mL/min signify possible Chronic Kidney Disease.    Anion gap 8 5 - 15    Comment: Performed at [facility], [place] [address], [place], [place] [address]  Ethanol     Status:  None   Collection Time: 02/28/18  6:20 PM  Result Value Ref Range   Alcohol, Ethyl (B) <10 <10 mg/dL    Comment:        LOWEST DETECTABLE LIMIT FOR SERUM ALCOHOL IS 10 mg/dL FOR MEDICAL PURPOSES ONLY Performed at [place] [address], [place], [place] [address]   Salicylate level     Status: None   Collection Time: 02/28/18  6:20 PM  Result Value Ref Range   Salicylate Lvl <4.9 2.8 - 30.0 mg/dL    Comment: Performed at [facility], [place] [address] [place], [place] [address]  Acetaminophen level     Status: Abnormal   Collection Time: 02/28/18  6:20 PM  Result Value Ref Range   Acetaminophen (Tylenol), Serum <10 (L) 10 - 30 ug/mL    Comment:        THERAPEUTIC CONCENTRATIONS VARY SIGNIFICANTLY. A RANGE OF 10-30 ug/mL MAY BE AN EFFECTIVE CONCENTRATION FOR MANY PATIENTS. HOWEVER, SOME ARE BEST TREATED AT CONCENTRATIONS OUTSIDE THIS RANGE. ACETAMINOPHEN CONCENTRATIONS >150 ug/mL AT 4 HOURS AFTER INGESTION AND >50 ug/mL AT 12 HOURS AFTER INGESTION ARE OFTEN ASSOCIATED WITH TOXIC REACTIONS. Performed at [facility], [place] [address] [place], [place] [address]   cbc     Status: Abnormal   Collection Time: 02/28/18  6:20 PM  Result Value Ref Range   WBC 8.6 4.0 - 10.5 K/uL   RBC 4.06 3.87 - 5.11 MIL/uL   Hemoglobin 11.8 (L) 12.0 - 15.0 g/dL   HCT 35.1 (L) 36.0 - 46.0 %   MCV 86.5 78.0 - 100.0 fL   MCH 29.1 26.0 - 34.0 pg   MCHC 33.6 30.0 - 36.0 g/dL   RDW 13.6 11.5 - 15.5 %   Platelets 286 150 - 400 K/uL    Comment: Performed at [facility], [place] [address] [place], [place] [address]  hCG, quantitative, pregnancy     Status: Abnormal   Collection Time: 02/28/18  6:20 PM  Result Value Ref Range   hCG, Beta Chain, Quant, S 202,450 (H) <5 mIU/mL    Comment:          GEST. AGE      CONC.  (mIU/mL)   <=1 WEEK        5 - 50     2 WEEKS       50 - 500     3 WEEKS       100 - 10,000     4  WEEKS     1,000 - 30,000     5 WEEKS     3,500 - 115,000   6-8 WEEKS     12,000 - 270,000    12 WEEKS     15,000 - 220,000        FEMALE AND NON-PREGNANT FEMALE:     LESS THAN 5 mIU/mL Performed at [facility], [place] [address] [place], [place] [address]   I-Stat beta hCG blood, ED     Status: Abnormal   Collection Time: 02/28/18  6:23 PM  Result Value Ref Range   I-stat hCG, quantitative >2,000.0 (H) <5 mIU/mL   Comment 3            Comment:   GEST. AGE      CONC.  (mIU/mL)   <=1 WEEK        5 - 50     2 WEEKS       50 - 500     3 WEEKS       100 - 10,000     4 WEEKS     1,000 - 30,000        FEMALE AND NON-PREGNANT FEMALE:     LESS THAN 5 mIU/mL     Current Facility-Administered Medications  Medication Dose Route Frequency Provider Last Rate Last Dose  . acetaminophen (TYLENOL) tablet 650 mg  650 mg Oral Q4H PRN Maczis, Barth Kirks, PA-C      . prenatal vitamin w/FE, FA (NATACHEW) chewable tablet 1 tablet  1 tablet Oral Daily Berton Mount, RPH   1 tablet at 03/01/18 0953  . QUEtiapine (SEROQUEL) tablet 50 mg  50 mg Oral  TID PRN Ethelene Hal, NP       Current Outpatient Medications  Medication Sig Dispense Refill  . ARIPiprazole (ABILIFY) 2 MG tablet Take 1 tablet (2 mg total) by mouth daily. (Patient not taking: Reported on 02/28/2018) 30 tablet 0  . escitalopram (LEXAPRO) 20 MG tablet Take 20 mg by mouth every morning.  2  . escitalopram (LEXAPRO) 5 MG tablet Take 1 tablet (5 mg total) by mouth daily. (Patient not taking: Reported on 02/28/2018) 30 tablet 0  . gabapentin (NEURONTIN) 300 MG capsule Take 1 capsule (300 mg total) by mouth 3 (three) times daily. (Patient not taking: Reported on 02/28/2018) 90 capsule 0  . Norgestimate-Ethinyl Estradiol Triphasic 0.18/0.215/0.25 MG-35 MCG tablet TAKE 1 TABLET DAILY (Patient not taking: No sig reported) 84 tablet 3  . traZODone (DESYREL) 50 MG tablet Take 1 tablet (50 mg total) by mouth at bedtime and may  repeat dose one time if needed. (Patient not taking: Reported on 02/28/2018) 30 tablet 0    Musculoskeletal: Strength & Muscle Tone: within normal limits Gait & Station: normal Patient leans: N/A  Psychiatric Specialty Exam: Physical Exam  Nursing note and vitals reviewed. Constitutional: She is oriented to person, place, and time. She appears well-developed and well-nourished.  HENT:  Head: Normocephalic.  Neck: Normal range of motion.  Respiratory: Effort normal.  Musculoskeletal: Normal range of motion.  Neurological: She is alert and oriented to person, place, and time.  Psychiatric: Her speech is normal and behavior is normal. Thought content normal. Cognition and memory are normal. She expresses impulsivity. She exhibits a depressed mood.    Review of Systems  Psychiatric/Behavioral: Positive for depression and substance abuse. Negative for hallucinations, memory loss and suicidal ideas. The patient is not nervous/anxious and does not have insomnia.   All other systems reviewed and are negative.   Blood pressure (!) 102/48, pulse 72, temperature 98.7 F (37.1 C), temperature source Oral, resp. rate 16, SpO2 98 %.There is no height or weight on file to calculate BMI.  General Appearance: Casual  Eye Contact:  Good  Speech:  Clear and Coherent and Normal Rate  Volume:  Decreased  Mood:  Depressed  Affect:  Congruent and Depressed  Thought Process:  Coherent and Linear  Orientation:  Full (Time, Place, and Person)  Thought Content:  Logical  Suicidal Thoughts:  No  Homicidal Thoughts:  No  Memory:  Immediate;   Good Recent;   Good Remote;   Fair  Judgement:  Poor  Insight:  Lacking  Psychomotor Activity:  Normal  Concentration:  Concentration: Good and Attention Span: Good  Recall:  Good  Fund of Knowledge:  Good  Language:  Good  Akathisia:  No  Handed:  Right  AIMS (if indicated):   N/A  Assets:  Communication Skills Housing  ADL's:  Intact  Cognition:  WNL   Sleep:   N/A     Treatment Plan Summary: Plan MDD (major depressive disorder), recurrent episode ([place]) Discharge Home Follow up with PCP and OB/GYN Take all medications as prescribed Avoid the use of alcohol and illicit drugs  Disposition: No evidence of imminent risk to self or others at present.   Patient does not meet criteria for psychiatric inpatient admission. Supportive therapy provided about ongoing stressors. Discussed crisis plan, support from social network, calling 911, coming to the Emergency Department, and calling Suicide Hotline.  Ethelene Hal, NP 03/01/2018 11:30 AM   Patient seen face-to-face for psychiatric evaluation, chart reviewed and case discussed  with the physician extender and developed treatment plan. Reviewed the information documented and agree with the treatment plan.  [place] Dresser, DO 03/01/18 6:52 PM

## 2018-03-01 NOTE — BHH Suicide Risk Assessment (Signed)
Suicide Risk Assessment  Discharge Assessment   [facility]   Principal Problem: MDD (major depressive disorder), recurrent episode [facility](HCC) Discharge Diagnoses:  Patient Active Problem List   Diagnosis Date Noted  . MDD (major depressive disorder), recurrent episode (HCC) [F33.9] 02/09/2017  . Dyspareunia [IMO0002] 04/15/2014  . Esophagitis, acute [K20.9] 10/28/2013  . Candidiasis of vulva and vagina [B37.3] 08/28/2013  . Vaginitis and vulvovaginitis, unspecified [N76.0] 06/26/2013  . Generalized anxiety disorder [F41.1] 03/02/2012  . Oppositional defiant disorder [F91.3] 03/02/2012  . Bulimia nervosa [F50.2] 02/26/2012  . Severe episode of recurrent major depressive disorder, without psychotic features (HCC) [F33.2] 02/26/2012  . Urinary tract infection [N39.0]   . Vision abnormalities [H53.9]     Total Time spent with patient: 45 minutes  Musculoskeletal: Strength & Muscle Tone: within normal limits Gait & Station: normal Patient leans: N/A  Psychiatric Specialty Exam: Physical Exam  Constitutional: She is oriented to person, place, and time. She appears well-developed and well-nourished.  HENT:  Head: Normocephalic.  Respiratory: Effort normal.  Musculoskeletal: Normal range of motion.  Neurological: She is alert and oriented to person, place, and time.  Psychiatric: Her speech is normal and behavior is normal. Thought content normal. Cognition and memory are normal. She expresses impulsivity. She exhibits a depressed mood.   Review of Systems  Psychiatric/Behavioral: Positive for depression and substance abuse. Negative for hallucinations, memory loss and suicidal ideas. The patient is not nervous/anxious and does not have insomnia.   All other systems reviewed and are negative.  Blood pressure (!) 102/48, pulse 72, temperature 98.7 F (37.1 C), temperature source Oral, resp. rate 16, SpO2 98 %.There is no height or weight on file to calculate  BMI. General Appearance: Casual Eye Contact:  Good Speech:  Clear and Coherent and Normal Rate Volume:  Decreased Mood:  Depressed Affect:  Congruent and Depressed Thought Process:  Coherent and Linear Orientation:  Full (Time, Place, and Person) Thought Content:  Logical Suicidal Thoughts:  No Homicidal Thoughts:  No Memory:  Immediate;   Good Recent;   Good Remote;   Fair Judgement:  Poor Insight:  Lacking Psychomotor Activity:  Normal Concentration:  Concentration: Good and Attention Span: Good Recall:  Good Fund of Knowledge:  Good Language:  Good Akathisia:  No Handed:  Right AIMS (if indicated):    Assets:  Communication Skills Housing ADL's:  Intact Cognition:  WNL   Mental Status Per Nursing Assessment::   On Admission:   Suicidal ideation  Demographic Factors:  Adolescent or young adult, Caucasian, Low socioeconomic status and Unemployed  Loss Factors: Financial problems/change in socioeconomic status  Historical Factors: Impulsivity  Risk Reduction Factors:   Sense of responsibility to family and Living with another person, especially a relative  Continued Clinical Symptoms:  Severe Anxiety and/or Agitation Depression:   Impulsivity Alcohol/Substance Abuse/Dependencies More than one psychiatric diagnosis  Cognitive Features That Contribute To Risk:  Closed-mindedness    Suicide Risk:  Minimal: No identifiable suicidal ideation.  Patients presenting with no risk factors but with morbid ruminations; may be classified as minimal risk based on the severity of the depressive symptoms  Follow-up Information    [employer] Follow up.   Why:  Please call the number on the back on your insurance card to find in-network doctors both for primary care and for ob/gyn.          Plan Of Care/Follow-up recommendations:  Activity:  as tolerated\ Diet:  Heart Healthy  Laveda AbbeLaurie Britton Erienne Spelman, NP 03/01/2018,  11:47 AM

## 2018-03-01 NOTE — ED Notes (Signed)
Peer support at bedside 

## 2018-03-01 NOTE — Patient Outreach (Signed)
ED Peer Support Specialist Patient Intake (Complete at intake & 30-60 Day Follow-up)  Name: Danielle Prince  MRN: [id number]  Age: 22 y.o.   Date of Admission: 03/01/2018  Intake: Initial Comments:      Primary Reason Admitted: 22 y.o. female. She came in under IVC after displaying paranoid behavior and making suicidal statements to her family.  The pt's sister stated the pt has not made any suicidal plans, but expresses SI.  The pt stated she is not currently suicidal.  The pt has been hospitalized for SI in 2018 and in 2013 for overdosing according to previous records.  The pt refused to share any information about her past SI.  She reported that people are mocking her and trying to do things to her.  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  At this time her UDS has not been completed.  The pt is currently not seeing a psychiatrist or a Social worker.  She reported the last time she went to a counselor was in 2018.  She denies depressive symptoms and stated she is eating and sleeping well.    Lab values: Alcohol/ETOH: Negative Positive UDS? No Amphetamines: No Barbiturates: No Benzodiazepines: No Cocaine: No Opiates: No Cannabinoids: No  Demographic information: Gender: Female Ethnicity: White Marital Status: Single Insurance Status: (BC BS) Ecologist (Work Neurosurgeon, Physicist, medical, etc.: No Lives with: Partner/Spouse Living situation: House/Apartment  Reported Patient History: Patient reported health conditions:   Patient aware of HIV and hepatitis status: No  In past year, has patient visited ED for any reason? Yes  Number of ED visits:    Reason(s) for visit:    In past year, has patient been hospitalized for any reason?    Number of hospitalizations:    Reason(s) for hospitalization:    In past year, has patient been arrested? No  Number of arrests:    Reason(s) for  arrest:    In past year, has patient been incarcerated? No  Number of incarcerations:    Reason(s) for incarceration:    In past year, has patient received medication-assisted treatment? No  In past year, patient received the following treatments: Psychiatric residential treatment facility  In past year, has patient received any harm reduction services? No  Did this include any of the following?    In past year, has patient received care from a mental health provider for diagnosis other than SUD? Yes  In past year, is this first time patient has overdosed? No  Number of past overdoses:    In past year, is this first time patient has been hospitalized for an overdose? No  Number of hospitalizations for overdose(s):    Is patient currently receiving treatment for a mental health diagnosis? Yes  Patient reports experiencing difficulty participating in SUD treatment: No    Most important reason(s) for this difficulty?    Has patient received prior services for treatment? No  In past, patient has received services from following agencies:    Plan of Care:  Suggested follow up at these agencies/treatment centers:    Other information: CPSS met with Pt and was unable to gain any information to be able to assist Pt. Pt is not interested in substance abuse services at this time.     Aaron Edelman Becket Wecker, CPSS  03/01/2018 11:27 AM

## 2018-03-01 NOTE — BHH Counselor (Signed)
Attempted to reach Pt's mother at [phone] to advise that Pt is going to be discharged.  Called.  Phone range, no answering machine, no pick up.

## 2018-03-01 NOTE — Care Management Note (Signed)
Case Management Note  CM contacted for help with establishing a PCP and OB/GYN.  CM noted pt has [employer].  Spoke with pt and advised her to call the number on the back on her ins card to find [facility] doctors.  No further CM needs noted at this time.

## 2018-03-01 NOTE — ED Notes (Signed)
Pt d/c home with mother per MD order. Discharge summary reviewed with pt. Pt verbalizes understanding. Pt denies SI/HI/AVH. Pt signed e-signature. Ambulatory off unit.

## 2018-03-01 NOTE — BH Assessment (Addendum)
[facility]  Per Juanetta BeetsJacqueline Norman, DO, this pt does not require psychiatric hospitalization at this time.  Pt presents under IVC initiated by pt's sister, which Dr Sharma CovertNorman has rescinded.  [facility] with [facility].  This has been included in pt's discharge instructions.  This Clinical research associatewriter also called Marylene LandAngela with nurse case management to assist with pt's OB/GYN needs in the community.  Call was placed at 10:30, and as of this writing, return call is pending.  Pt would also benefit from seeing Peer Support Specialists; they will be asked to speak to pt.  Pt's nurse, Morrie Sheldonshley, has been notified.  Dr Sharma CovertNorman requests that pt's mother, Marylu LundJanet [phone]) be called to facilitate discharge.  At 10:41 Marylu LundJanet called and spoke to this Clinical research associatewriter.  She [facility] pt up.  Doylene Canninghomas Gamal Todisco, MA Triage Specialist [phone]

## 2018-03-01 NOTE — ED Notes (Signed)
Specimen cup provided. Pt encouraged to provide urine,per MD order.

## 2018-03-02 ENCOUNTER — Emergency Department (HOSPITAL_COMMUNITY)
Admission: EM | Admit: 2018-03-02 | Discharge: 2018-03-02 | Disposition: A | Discharge status: Home / Self Care | Payer: BLUE CROSS/BLUE SHIELD | Attending: Emergency Medicine | Admitting: Emergency Medicine

## 2018-03-02 ENCOUNTER — Other Ambulatory Visit: Payer: Self-pay

## 2018-03-02 ENCOUNTER — Encounter (HOSPITAL_COMMUNITY): Payer: Self-pay | Admitting: Nurse Practitioner

## 2018-03-02 DIAGNOSIS — Z87891 Personal history of nicotine dependence: Secondary | ICD-10-CM | POA: Diagnosis not present

## 2018-03-02 DIAGNOSIS — F419 Anxiety disorder, unspecified: Secondary | ICD-10-CM | POA: Diagnosis not present

## 2018-03-02 DIAGNOSIS — O26891 Other specified pregnancy related conditions, first trimester: Secondary | ICD-10-CM | POA: Diagnosis not present

## 2018-03-02 DIAGNOSIS — Z79899 Other long term (current) drug therapy: Secondary | ICD-10-CM | POA: Insufficient documentation

## 2018-03-02 DIAGNOSIS — Z3A01 Less than 8 weeks gestation of pregnancy: Secondary | ICD-10-CM | POA: Diagnosis not present

## 2018-03-02 DIAGNOSIS — O99341 Other mental disorders complicating pregnancy, first trimester: Secondary | ICD-10-CM | POA: Diagnosis not present

## 2018-03-02 DIAGNOSIS — Z046 Encounter for general psychiatric examination, requested by authority: Secondary | ICD-10-CM | POA: Diagnosis not present

## 2018-03-02 DIAGNOSIS — Z3490 Encounter for supervision of normal pregnancy, unspecified, unspecified trimester: Secondary | ICD-10-CM

## 2018-03-02 NOTE — ED Notes (Signed)
Patient was discharged home with mother

## 2018-03-02 NOTE — ED Provider Notes (Signed)
[facility]-EMERGENCY DEPT Provider Note   CSN: [id number] Arrival date & time: 03/02/18  1323     History   Chief Complaint No chief complaint on file.   HPI Danielle Prince is a 22 y.o. female.  HPI Patient brought in under IVC.  IVC paperwork states that patient has been confused and delusional.  Seen in the ER yesterday by psychiatry and discharged.  She is around [redacted] weeks pregnant.  Patient states she was brought in because her mother thought she was doing methamphetamines.  States she has not done that in over a week.  Denies suicidal homicidal thoughts.  Patient is somewhat uncooperative with the history. Past Medical History:  Diagnosis Date  . ADHD (attention deficit hyperactivity disorder)   . ADHD (attention deficit hyperactivity disorder)   . Anorexia nervosa with bulimia   . Anxiety   . Aspirin overdose 02/2012  . Depression   . Dysrhythmia   . Eating disorder   . Electrolyte disturbance 2010, 2012   hypokalemia, hypochloremia.  . Urinary tract infection   . Varicella   . Vision abnormalities     Patient Active Problem List   Diagnosis Date Noted  . Depression   . MDD (major depressive disorder), recurrent episode (HCC) 02/09/2017  . Dyspareunia 04/15/2014  . Esophagitis, acute 10/28/2013  . Candidiasis of vulva and vagina 08/28/2013  . Vaginitis and vulvovaginitis, unspecified 06/26/2013  . Generalized anxiety disorder 03/02/2012  . Oppositional defiant disorder 03/02/2012  . Bulimia nervosa 02/26/2012  . Severe episode of recurrent major depressive disorder, without psychotic features (HCC) 02/26/2012  . Urinary tract infection   . Vision abnormalities     Past Surgical History:  Procedure Laterality Date  . COLONOSCOPY WITH ESOPHAGOGASTRODUODENOSCOPY (EGD) N/A 10/28/2013   Procedure: COLONOSCOPY WITH ESOPHAGOGASTRODUODENOSCOPY (EGD);  Surgeon: Beverley Fiedler, MD;  Location[facility] ENDOSCOPY;  Service: Endoscopy;  Laterality: N/A;  .  GUM SURGERY    . INDUCED ABORTION      OB History    Gravida Para Term Preterm AB Living   2 0 0 0 1 0   SAB TAB Ectopic Multiple Live Births   0 1 0 0         Home Medications    Prior to Admission medications   Medication Sig Start Date End Date Taking? Authorizing Provider  escitalopram (LEXAPRO) 20 MG tablet Take 20 mg by mouth every morning. 01/01/18  Yes [provider]  gabapentin (NEURONTIN) 300 MG capsule Take 1 capsule (300 mg total) by mouth 3 (three) times daily. Patient taking differently: Take 600 mg by mouth daily.  02/13/17  Yes Rankin, Shuvon B, NP  lamoTRIgine (LAMICTAL) 150 MG tablet Take 150 mg by mouth daily.   Yes [provider]  traZODone (DESYREL) 50 MG tablet Take 1 tablet (50 mg total) by mouth at bedtime and may repeat dose one time if needed. 02/13/17  Yes Rankin, Shuvon B, NP  ARIPiprazole (ABILIFY) 2 MG tablet Take 1 tablet (2 mg total) by mouth daily. Patient not taking: Reported on 02/28/2018 02/14/17   Rankin, Shuvon B, NP  escitalopram (LEXAPRO) 5 MG tablet Take 1 tablet (5 mg total) by mouth daily. Patient not taking: Reported on 02/28/2018 02/14/17   Rankin, Denice Bors B, NP  Norgestimate-Ethinyl Estradiol Triphasic 0.18/0.215/0.25 MG-35 MCG tablet TAKE 1 TABLET DAILY Patient not taking: No sig reported 03/14/16   Brock Bad, MD    Family History Family History  Problem Relation Age of Onset  .  Cancer Father   . Depression Sister   . Depression Paternal Aunt   . Drug abuse Maternal Uncle        deceased  . Drug abuse Paternal Aunt   . Colon cancer Maternal Grandmother     Social History Social History   Tobacco Use  . Smoking status: Former Smoker    Packs/day: 0.50    Types: Cigarettes    Last attempt to quit: 06/25/2013    Years since quitting: 4.6  . Smokeless tobacco: Never Used  Substance Use Topics  . Alcohol use: Yes    Alcohol/week: 0.0 oz    Comment: occasioally   . Drug use: No    Comment: 3x a week      Allergies   Patient has no known allergies.   Review of Systems Review of Systems  Constitutional: Negative for appetite change.  Respiratory: Negative for shortness of breath.   Cardiovascular: Negative for leg swelling.  Gastrointestinal: Negative for abdominal distention.  Genitourinary: Negative for dysuria.  Musculoskeletal: Negative for back pain.  Neurological: Negative for seizures.  Psychiatric/Behavioral: Negative for confusion and hallucinations.     Physical Exam Updated Vital Signs Ht 5\' 9"  (1.753 m)   Wt 68 kg (150 lb)   LMP 01/02/2018 (Approximate)   BMI 22.15 kg/m   Physical Exam  Constitutional: She appears well-developed.  HENT:  Head: Normocephalic.  Neck: Neck supple.  Cardiovascular: Normal rate.  Pulmonary/Chest: Effort normal.  Abdominal: Soft.  Neurological: She is alert.  Skin: Skin is warm.  Psychiatric:  Patient is somewhat uncooperative with history     ED Treatments / Results  Labs (all labs ordered are listed, but only abnormal results are displayed) Labs Reviewed  HIV ANTIBODY (ROUTINE TESTING)  RPR  GC/CHLAMYDIA PROBE AMP ([place]) NOT AT [facility]    EKG  EKG Interpretation None       Radiology US Ob Comp < 14 Wks  Result Date: 02/28/2018 CLINICAL DATA:  22 year old pregnant female presenting for evaluation of pregnancy and size and dating. LMP: 01/08/2018 corresponding to an estimated gestational age of [redacted] weeks, 2 days. EXAM: OBSTETRIC <14 WK ULTRASOUND TECHNIQUE: Transabdominal ultrasound was performed for evaluation of the gestation as well as the maternal uterus and adnexal regions. COMPARISON:  None. FINDINGS: Intrauterine gestational sac: Single intrauterine gestational sac. Yolk sac:  Seen Embryo:  Present Cardiac Activity: Detected Heart Rate: 153 bpm CRL: 14 mm   7 w 5 d                  [place] EDC: 10/12/2018 Subchorionic hemorrhage:  None visualized. Maternal uterus/adnexae: The uterus is anteverted. The ovaries  are not visualized. IMPRESSION: Single live intrauterine pregnancy with an estimated gestational age of [redacted] weeks, 5 days. Electronically Signed   By: Elgie Collard M.D.   On: 02/28/2018 20:02    Procedures Procedures (including critical care time)  Medications Ordered in ED Medications - No data to display   Initial Impression / Assessment and Plan / ED Course  I have reviewed the triage vital signs and the nursing notes.  Pertinent labs & imaging results that were available during my care of the patient were reviewed by me and considered in my medical decision making (see chart for details).     Patient brought in under IVC.  Medically cleared.  Patient did request STD testing.  Somewhat pressured speech.  Seen by psychiatry who states they know the patient well.  They have cleared her for  discharge although they asked me to sign the first examination.  Discharge home to follow-up with OB and her psychiatric resources.  Final Clinical Impressions(s) / ED Diagnoses   Final diagnoses:  Pregnancy, unspecified gestational age  Anxiety    ED Discharge Orders    None       Benjiman CorePickering, Azzan Butler, MD 03/02/18 [phone]

## 2018-03-02 NOTE — BH Assessment (Signed)
Assessment Note  Danielle Prince is an 22 y.o. female. Pt denies SI/HI and AVH. Pt uses methamphemine daily. Pt is currently [redacted] weeks pregnant. The Pt has been diagnosed with ADHD, anxiety, and depression. Pt had a eating disorder in the past. Pt states she is not currently taking her prescribed medication due to her pregnancy. Pt was IVCd by her mother for paranoia and delusions. Pt denied. Pt was oriented and stated that her mother is trying to prevent her from living her life. Pt states she does not feel her mother needs to watch her 24 hrs. Per Pt's mother she watches her so she does not use drugs. Pt states she is also upset that other people have gotten involved in her relationship with boyfriend [place].The Pt states she is not certain she will maintain her pregnancy (possibly getting an abortion) due to her mental health and the child's father not being involved.   Jacki ConesLaurie, NP recommends D/C and follow-up with OBGYN and the [facility].  Diagnosis:  F41.1 GAD; F11.20 Opioid use  Past Medical History:  Past Medical History:  Diagnosis Date  . ADHD (attention deficit hyperactivity disorder)   . ADHD (attention deficit hyperactivity disorder)   . Anorexia nervosa with bulimia   . Anxiety   . Aspirin overdose 02/2012  . Depression   . Dysrhythmia   . Eating disorder   . Electrolyte disturbance 2010, 2012   hypokalemia, hypochloremia.  . Urinary tract infection   . Varicella   . Vision abnormalities     Past Surgical History:  Procedure Laterality Date  . COLONOSCOPY WITH ESOPHAGOGASTRODUODENOSCOPY (EGD) N/A 10/28/2013   Procedure: COLONOSCOPY WITH ESOPHAGOGASTRODUODENOSCOPY (EGD);  Surgeon: Beverley FiedlerJay M Pyrtle, MD;  [facility];  Service: Endoscopy;  Laterality: N/A;  . GUM SURGERY    . INDUCED ABORTION      Family History:  Family History  Problem Relation Age of Onset  . Cancer Father   . Depression Sister   . Depression Paternal Aunt   . Drug abuse Maternal Uncle         deceased  . Drug abuse Paternal Aunt   . Colon cancer Maternal Grandmother     Social History:  reports that she quit smoking about 4 years ago. Her smoking use included cigarettes. She smoked 0.50 packs per day. she has never used smokeless tobacco. She reports that she drinks alcohol. She reports that she does not use drugs.  Additional Social History:  Alcohol / Drug Use Pain Medications: please see mar Prescriptions: please see mar Over the Counter: please see mar History of alcohol / drug use?: Yes Longest period of sobriety (when/how long): unknown Negative Consequences of Use: Financial, Personal relationships Substance #1 Name of Substance 1: methamplme 1 - Age of First Use: unknwon 1 - Amount (size/oz): unknown 1 - Frequency: unknown 1 - Duration: unknonw 1 - Last Use / Amount: 02/28/18  CIWA:   COWS:    Allergies: No Known Allergies  Home Medications:  (Not in a hospital admission)  OB/GYN Status:  Patient's last menstrual period was 01/02/2018 (approximate).  General Assessment Data Location of Assessment: [facility] TTS Assessment: In system Is this a Tele or Face-to-Face Assessment?: Face-to-Face Is this an Initial Assessment or a Re-assessment for this encounter?: Initial Assessment Marital status: Single Maiden name: NA Is patient pregnant?: Yes Pregnancy Status: Yes (Comment: include estimated delivery date)(8 weeks) Living Arrangements: Other relatives Can pt return to current living arrangement?: Yes Admission Status: Involuntary Is patient capable  of signing voluntary admission?: Yes Referral Source: Self/Family/Friend Insurance type: BCBS     Crisis Care Plan Living Arrangements: Other relatives Legal Guardian: Other:(self) Name of Psychiatrist: NA Name of Therapist: NA  Education Status Is patient currently in school?: No Is the patient employed, unemployed or receiving disability?: Unemployed  Risk to self with the past 6 months Suicidal  Ideation: No Has patient been a risk to self within the past 6 months prior to admission? : No Suicidal Intent: No Has patient had any suicidal intent within the past 6 months prior to admission? : No Is patient at risk for suicide?: No Suicidal Plan?: No Has patient had any suicidal plan within the past 6 months prior to admission? : No Access to Means: No What has been your use of drugs/alcohol within the last 12 months?: NA Previous Attempts/Gestures: No How many times?: 0 Other Self Harm Risks: NA Triggers for Past Attempts: Unknown Intentional Self Injurious Behavior: None Family Suicide History: No Recent stressful life event(s): Other (Comment)(SA) Persecutory voices/beliefs?: No Depression: No Depression Symptoms: (pt denies) Substance abuse history and/or treatment for substance abuse?: Yes Suicide prevention information given to non-admitted patients: Not applicable  Risk to Others within the past 6 months Homicidal Ideation: No Does patient have any lifetime risk of violence toward others beyond the six months prior to admission? : No Thoughts of Harm to Others: No Current Homicidal Intent: No Current Homicidal Plan: No Access to Homicidal Means: No Identified Victim: NA History of harm to others?: No Assessment of Violence: None Noted Violent Behavior Description: NA Does patient have access to weapons?: No Criminal Charges Pending?: No Does patient have a court date: No Is patient on probation?: No  Psychosis Hallucinations: None noted Delusions: None noted  Mental Status Report Appearance/Hygiene: Unremarkable, In scrubs Eye Contact: Good Motor Activity: Freedom of movement Speech: Logical/coherent Level of Consciousness: Alert Mood: Anxious Affect: Anxious Anxiety Level: Moderate Thought Processes: Coherent, Relevant Judgement: Unimpaired Orientation: Person, Place, Time, Situation Obsessive Compulsive Thoughts/Behaviors: None  Cognitive  Functioning Concentration: Normal Memory: Recent Intact, Remote Intact Is patient IDD: No Is patient DD?: No Insight: Fair Impulse Control: Fair Appetite: Fair Have you had any weight changes? : No Change Sleep: No Change Total Hours of Sleep: 8 Vegetative Symptoms: None  ADLScreening [facility] Assessment Services) Patient's cognitive ability adequate to safely complete daily activities?: Yes Patient able to express need for assistance with ADLs?: Yes  Prior Inpatient Therapy Prior Inpatient Therapy: Yes Prior Therapy Dates: 2018, 2013 Prior Therapy Facilty/Provider(s): [facility] Reason for Treatment: SI and OD  Prior Outpatient Therapy Prior Outpatient Therapy: Yes Prior Therapy Dates: 2018 Prior Therapy Facilty/Provider(s): [facility] Reason for Treatment: NA Does patient have an ACCT team?: No Does patient have Intensive In-House Services?  : No Does patient have [facility] services? : No Does patient have P4CC services?: No  ADL Screening (condition at time of admission) Patient's cognitive ability adequate to safely complete daily activities?: Yes Is the patient deaf or have difficulty hearing?: No Does the patient have difficulty seeing, even when wearing glasses/contacts?: No Does the patient have difficulty concentrating, remembering, or making decisions?: No Patient able to express need for assistance with ADLs?: Yes Does the patient have difficulty dressing or bathing?: No       Abuse/Neglect Assessment (Assessment to be complete while patient is alone) Physical Abuse: Denies Verbal Abuse: Denies Sexual Abuse: Denies Exploitation of patient/patient's resources: Denies          Additional Information 1:1  In Past 12 Months?: No CIRT Risk: No Elopement Risk: No Does patient have medical clearance?: Yes     Disposition:  Disposition Initial Assessment Completed for this Encounter: Yes Disposition of Patient: Discharge  On Site Evaluation by:    Reviewed with Physician:    Emmit Pomfret 03/02/2018 4:58 PM

## 2018-03-02 NOTE — ED Notes (Signed)
Bed: WA26 Expected date:  Expected time:  Means of arrival:  Comments: 

## 2018-03-02 NOTE — Progress Notes (Addendum)
Patient ID: Danielle Prince, female   DOB: 1996-06-29, 22 y.o.   MRN: [id number]  Pt was seen and chart reviewed. Pt was discussed with treatment team. Pt was discharged from this facility on 03-01-2018 due to no criteria for hospitalization. Pt found out during her ED stay that she is approximately [redacted] weeks pregnant. Pt has an ongoing problem with amphetamine use but stated she has not used since Friday. Pt had been taking antipsychotics, antidepressants, and gabapentin but admitted during assessment on 03-01-2018 that she has not been taking these medications for quite some time. Pt's mother requested that Pt be given prescriptions for Lexapro and Gabapentin upon discharge on 03-01-2018. This Clinical research associatewriter refused to write prescriptions due to pregnancy with risk of adverse effects to fetus (home medications include Lamictal, Neurontin, Abilify, Trazodone and Lexapro). Pt was instructed to follow up with an OB/GYN and [facility] for appropriate prenatal care. At this time, she does not have acute psychiatric needs that require psychiatric medications so that the benefits outweigh the risks of treatment. She reports substance abuse (UDS positive for amphetamines) and would benefit from substance abuse treatment but she declined resources at prior hospitalization.    Pt's mother called the emergency room this morning and spoke with Danielle Prince, Director and requested that Pt be given her prescriptions. Pt's mother also spoke to Peer Support specialist by phone and requested that Pt be given prescriptions. Pt's mother was informed that this would not be happening. Pt's mother then IVC'd the Pt and had brought he back to the ED today. The IVC stated Pt had been aggressive and hostile toward her aunt and that Pt was hallucinating. Pt gave permission for her mother to be present during assessment. Pt denied all the allegation in the IVC and denies suicidal/homicidal ideation, denies auditory/visual hallucinations and does not  appear to be responding to internal stimuli.Pt's mother again requesting prescriptions. Pt and her mother were informed that medications will not be prescribed in the ED given pregnancy and necessary follow up needed with an OB/GYN. Pt is psychiatrically clear.   Danielle AbbeLaurie Britton Parks, NP-C 03/02/2018      1552  Patient's chart reviewed and case discussed with the physician extender and developed treatment plan. Reviewed the information documented and agree with the treatment plan.  Danielle BeetsJacqueline Mallika Sanmiguel, DO 03/02/18 4:06 PM

## 2018-03-02 NOTE — Discharge Instructions (Signed)
For your behavioral health needs, you are advised to follow up with the [facility].  Contact them at your earliest opportunity to ask about scheduling an intake appointment: ° °     [facility] °     [address] °     [place], [place] [address] °     [phone] °

## 2018-03-02 NOTE — BH Assessment (Addendum)
[facility]  Per Laveda AbbeLaurie Britton Parks, FNP, this pt does not require psychiatric hospitalization at this time.  Pt presents under IVC initiated by pt's mother.  This Clinical research associatewriter has advised EDP Benjiman CoreNathan Pickering, MD of recommended disposition, and that pt will need First Examination to release her from IVC.  [facility] have been reiterated in today's discharge instructions.  Pt's nurse, Donnal DebarRandi, has been notified.  Doylene Canninghomas Lajune Perine, MA Triage Specialist [phone]

## 2018-03-02 NOTE — ED Triage Notes (Signed)
Patient brought in by [employer]. Mother took patient to the court house and IVCd her due to her doing drugs while she is pregnant. [facility]

## 2018-03-03 LAB — HIV ANTIBODY (ROUTINE TESTING W REFLEX): HIV SCREEN 4TH GENERATION: NONREACTIVE

## 2018-03-03 LAB — GC/CHLAMYDIA PROBE AMP (~~LOC~~) NOT AT ARMC
CHLAMYDIA, DNA PROBE: POSITIVE — AB
NEISSERIA GONORRHEA: NEGATIVE

## 2018-03-03 LAB — RPR: RPR: NONREACTIVE

## 2018-03-20 DIAGNOSIS — F39 Unspecified mood [affective] disorder: Secondary | ICD-10-CM | POA: Diagnosis not present

## 2018-03-20 DIAGNOSIS — Z7721 Contact with and (suspected) exposure to potentially hazardous body fluids: Secondary | ICD-10-CM | POA: Diagnosis not present

## 2018-04-06 DIAGNOSIS — Z7251 High risk heterosexual behavior: Secondary | ICD-10-CM | POA: Diagnosis not present

## 2018-04-06 DIAGNOSIS — Z113 Encounter for screening for infections with a predominantly sexual mode of transmission: Secondary | ICD-10-CM | POA: Diagnosis not present

## 2018-04-06 DIAGNOSIS — N760 Acute vaginitis: Secondary | ICD-10-CM | POA: Diagnosis not present

## 2018-05-03 DIAGNOSIS — F603 Borderline personality disorder: Secondary | ICD-10-CM | POA: Diagnosis not present

## 2018-05-14 DIAGNOSIS — F1520 Other stimulant dependence, uncomplicated: Secondary | ICD-10-CM | POA: Diagnosis not present

## 2018-05-14 DIAGNOSIS — Z79899 Other long term (current) drug therapy: Secondary | ICD-10-CM | POA: Diagnosis not present

## 2018-05-23 DIAGNOSIS — Z111 Encounter for screening for respiratory tuberculosis: Secondary | ICD-10-CM | POA: Diagnosis not present

## 2018-05-23 DIAGNOSIS — Z23 Encounter for immunization: Secondary | ICD-10-CM | POA: Diagnosis not present

## 2018-06-01 DIAGNOSIS — F1520 Other stimulant dependence, uncomplicated: Secondary | ICD-10-CM | POA: Diagnosis not present

## 2018-06-01 DIAGNOSIS — Z79899 Other long term (current) drug therapy: Secondary | ICD-10-CM | POA: Diagnosis not present

## 2018-06-06 DIAGNOSIS — F603 Borderline personality disorder: Secondary | ICD-10-CM | POA: Diagnosis not present

## 2018-06-21 DIAGNOSIS — B373 Candidiasis of vulva and vagina: Secondary | ICD-10-CM | POA: Diagnosis not present

## 2018-06-21 DIAGNOSIS — Z113 Encounter for screening for infections with a predominantly sexual mode of transmission: Secondary | ICD-10-CM | POA: Diagnosis not present

## 2018-07-04 DIAGNOSIS — F603 Borderline personality disorder: Secondary | ICD-10-CM | POA: Diagnosis not present

## 2018-07-20 ENCOUNTER — Encounter: Payer: Self-pay | Admitting: Obstetrics

## 2018-07-20 ENCOUNTER — Ambulatory Visit (INDEPENDENT_AMBULATORY_CARE_PROVIDER_SITE_OTHER): Payer: BLUE CROSS/BLUE SHIELD | Admitting: Obstetrics

## 2018-07-20 VITALS — BP 117/79 | HR 101 | Ht 70.0 in | Wt 146.0 lb

## 2018-07-20 DIAGNOSIS — N898 Other specified noninflammatory disorders of vagina: Secondary | ICD-10-CM | POA: Diagnosis not present

## 2018-07-20 DIAGNOSIS — Z0000 Encounter for general adult medical examination without abnormal findings: Secondary | ICD-10-CM | POA: Diagnosis not present

## 2018-07-20 DIAGNOSIS — Z01419 Encounter for gynecological examination (general) (routine) without abnormal findings: Secondary | ICD-10-CM

## 2018-07-20 DIAGNOSIS — Z113 Encounter for screening for infections with a predominantly sexual mode of transmission: Secondary | ICD-10-CM

## 2018-07-20 DIAGNOSIS — Z124 Encounter for screening for malignant neoplasm of cervix: Secondary | ICD-10-CM | POA: Diagnosis not present

## 2018-07-20 DIAGNOSIS — Z3042 Encounter for surveillance of injectable contraceptive: Secondary | ICD-10-CM

## 2018-07-20 DIAGNOSIS — N939 Abnormal uterine and vaginal bleeding, unspecified: Secondary | ICD-10-CM

## 2018-07-20 NOTE — Progress Notes (Signed)
Pt presents for AEX. Pt states that she is having some vaginal irritation. She would like to have STD screening.

## 2018-07-20 NOTE — Progress Notes (Signed)
Subjective:        Danielle Prince is a 22 y.o. female here for a routine exam.  Current complaints: Vaginal irritation and irregular vaginal bleeding since starting Depo Provera Injections.    Personal health questionnaire:  Is patient Ashkenazi Jewish, have a family history of breast and/or ovarian cancer: no Is there a family history of uterine cancer diagnosed at age < 7650, gastrointestinal cancer, urinary tract cancer, family member who is a Personnel officerLynch syndrome-associated carrier: no Is the patient overweight and hypertensive, family history of diabetes, personal history of gestational diabetes, preeclampsia or PCOS: no Is patient over 6055, have PCOS,  family history of premature CHD under age 23, diabetes, smoke, have hypertension or peripheral artery disease:  no At any time, has a partner hit, kicked or otherwise hurt or frightened you?: no Over the past 2 weeks, have you felt down, depressed or hopeless?: no Over the past 2 weeks, have you felt little interest or pleasure in doing things?:no   Gynecologic History No LMP recorded. Contraception: Depo-Provera injections Last Pap: 2018. Results were: normal Last mammogram: n/a. Results were: n/a  Obstetric History OB History  Gravida Para Term Preterm AB Living  2 0 0 0 2 0  SAB TAB Ectopic Multiple Live Births  0 2 0 0      # Outcome Date GA Lbr Len/2nd Weight Sex Delivery Anes PTL Lv  2 TAB           1 TAB             Past Medical History:  Diagnosis Date  . ADHD (attention deficit hyperactivity disorder)   . ADHD (attention deficit hyperactivity disorder)   . Anorexia nervosa with bulimia   . Anxiety   . Aspirin overdose 02/2012  . Depression   . Dysrhythmia   . Eating disorder   . Electrolyte disturbance 2010, 2012   hypokalemia, hypochloremia.  . Urinary tract infection   . Varicella   . Vision abnormalities     Past Surgical History:  Procedure Laterality Date  . COLONOSCOPY WITH  ESOPHAGOGASTRODUODENOSCOPY (EGD) N/A 10/28/2013   Procedure: COLONOSCOPY WITH ESOPHAGOGASTRODUODENOSCOPY (EGD);  Surgeon: Beverley FiedlerJay M Pyrtle, MD;  [facility];  Service: Endoscopy;  Laterality: N/A;  . GUM SURGERY    . INDUCED ABORTION       Current Outpatient Medications:  .  ARIPiprazole (ABILIFY) 10 MG tablet, Take 10 mg by mouth daily., Disp: , Rfl: 2 .  ARIPiprazole (ABILIFY) 2 MG tablet, Take 1 tablet (2 mg total) by mouth daily., Disp: 30 tablet, Rfl: 0 .  escitalopram (LEXAPRO) 10 MG tablet, Take 10 mg by mouth every morning., Disp: , Rfl: 2 .  gabapentin (NEURONTIN) 300 MG capsule, Take 1 capsule (300 mg total) by mouth 3 (three) times daily. (Patient taking differently: Take 600 mg by mouth daily. ), Disp: 90 capsule, Rfl: 0 .  lamoTRIgine (LAMICTAL) 150 MG tablet, Take 150 mg by mouth daily., Disp: , Rfl:  .  traZODone (DESYREL) 50 MG tablet, Take 1 tablet (50 mg total) by mouth at bedtime and may repeat dose one time if needed., Disp: 30 tablet, Rfl: 0 No Known Allergies  Social History   Tobacco Use  . Smoking status: Current Some Day Smoker    Packs/day: 0.50    Types: Cigarettes  . Smokeless tobacco: Never Used  Substance Use Topics  . Alcohol use: Not Currently    Alcohol/week: 0.0 oz    Family History  Problem Relation  Age of Onset  . Cancer Father   . Depression Sister   . Depression Paternal Aunt   . Drug abuse Maternal Uncle        deceased  . Drug abuse Paternal Aunt   . Colon cancer Maternal Grandmother       Review of Systems  Constitutional: negative for fatigue and weight loss Respiratory: negative for cough and wheezing Cardiovascular: negative for chest pain, fatigue and palpitations Gastrointestinal: negative for abdominal pain and change in bowel habits Musculoskeletal:negative for myalgias Neurological: negative for gait problems and tremors Behavioral/Psych: negative for abusive relationship, depression Endocrine: negative for  temperature intolerance    Genitourinary:negative for abnormal menstrual periods, genital lesions, hot flashes, sexual problems and vaginal discharge Integument/breast: negative for breast lump, breast tenderness, nipple discharge and skin lesion(s)    Objective:       BP 117/79   Pulse (!) 101   Ht 5\' 10"  (1.778 m)   Wt 146 lb (66.2 kg)   BMI 20.95 kg/m  General:   alert  Skin:   no rash or abnormalities  Lungs:   clear to auscultation bilaterally  Heart:   regular rate and rhythm, S1, S2 normal, no murmur, click, rub or gallop  Breasts:   normal without suspicious masses, skin or nipple changes or axillary nodes  Abdomen:  normal findings: no organomegaly, soft, non-tender and no hernia  Pelvis:  External genitalia: normal general appearance Urinary system: urethral meatus normal and bladder without fullness, nontender Vaginal: normal without tenderness, induration or masses Cervix: normal appearance Adnexa: normal bimanual exam Uterus: anteverted and non-tender, normal size   Lab Review Urine pregnancy test Labs reviewed yes Radiologic studies reviewed no  50% of 20 min visit spent on counseling and coordination of care.   Assessment:     1. Encounter for routine gynecological examination with Papanicolaou smear of cervix Rx: - Cytology - PAP  2. Vaginal discharge Rx: - Cervicovaginal ancillary only  3. Encounter for surveillance of injectable contraceptive - has received 1 injection, and is having typical irregular bleeding - requesting treatment of irregular vaginal bleeding   4. Abnormal uterine bleeding (AUB) - Taytulla dispensed ( 2 packs )    Plan:    Education reviewed: calcium supplements, depression evaluation, low fat, low cholesterol diet, safe sex/STD prevention, self breast exams, skin cancer screening, smoking cessation and weight bearing exercise. Contraception: Depo-Provera injections. Follow up in: 1 year.   No orders of the defined types  were placed in this encounter.  No orders of the defined types were placed in this encounter.   Brock Bad MD 07-20-2018

## 2018-07-21 LAB — CERVICOVAGINAL ANCILLARY ONLY
BACTERIAL VAGINITIS: NEGATIVE
CANDIDA VAGINITIS: NEGATIVE
CHLAMYDIA, DNA PROBE: NEGATIVE
Neisseria Gonorrhea: NEGATIVE
TRICH (WINDOWPATH): NEGATIVE

## 2018-07-24 LAB — CYTOLOGY - PAP: Diagnosis: NEGATIVE

## 2018-07-26 ENCOUNTER — Telehealth: Payer: Self-pay | Admitting: *Deleted

## 2018-07-26 NOTE — Telephone Encounter (Signed)
Pt informed of normal results.

## 2018-07-26 NOTE — Telephone Encounter (Signed)
Patient called in and requested her results from test, please advise and call on the [phone] number

## 2018-07-31 DIAGNOSIS — L02419 Cutaneous abscess of limb, unspecified: Secondary | ICD-10-CM | POA: Diagnosis not present

## 2018-08-08 DIAGNOSIS — F603 Borderline personality disorder: Secondary | ICD-10-CM | POA: Diagnosis not present

## 2018-09-02 DIAGNOSIS — Z113 Encounter for screening for infections with a predominantly sexual mode of transmission: Secondary | ICD-10-CM | POA: Diagnosis not present

## 2018-09-02 DIAGNOSIS — N760 Acute vaginitis: Secondary | ICD-10-CM | POA: Diagnosis not present

## 2018-09-19 DIAGNOSIS — F603 Borderline personality disorder: Secondary | ICD-10-CM | POA: Diagnosis not present

## 2018-09-20 DIAGNOSIS — F603 Borderline personality disorder: Secondary | ICD-10-CM | POA: Diagnosis not present

## 2018-09-21 ENCOUNTER — Ambulatory Visit: Payer: BLUE CROSS/BLUE SHIELD | Admitting: *Deleted

## 2018-09-21 DIAGNOSIS — Z30013 Encounter for initial prescription of injectable contraceptive: Secondary | ICD-10-CM

## 2018-09-21 MED ORDER — MEDROXYPROGESTERONE ACETATE 150 MG/ML IM SUSP: 150.0000 mg | INTRAMUSCULAR | 0 refills | Status: DC

## 2018-09-21 NOTE — Progress Notes (Signed)
Depo sent to pharmacy today. Pt did not have prescription to fill. Pt was not seen in office today.  Pt made aware that Rx will be sent in and she may return tomorrow for injection. Pt made aware she may need a UPT in office and Depo may be given per provider approval.  Pt states understanding.

## 2018-09-22 ENCOUNTER — Ambulatory Visit: Payer: BLUE CROSS/BLUE SHIELD

## 2018-09-25 ENCOUNTER — Ambulatory Visit (INDEPENDENT_AMBULATORY_CARE_PROVIDER_SITE_OTHER): Payer: BLUE CROSS/BLUE SHIELD

## 2018-09-25 VITALS — BP 128/81 | HR 100 | Wt 159.3 lb

## 2018-09-25 DIAGNOSIS — Z3202 Encounter for pregnancy test, result negative: Secondary | ICD-10-CM

## 2018-09-25 DIAGNOSIS — Z30013 Encounter for initial prescription of injectable contraceptive: Secondary | ICD-10-CM

## 2018-09-25 LAB — POCT URINE PREGNANCY: PREG TEST UR: NEGATIVE

## 2018-09-25 MED ORDER — MEDROXYPROGESTERONE ACETATE 150 MG/ML IM SUSP: 150.0000 mg | INTRAMUSCULAR | 3 refills | Status: DC

## 2018-09-25 MED ORDER — MEDROXYPROGESTERONE ACETATE 150 MG/ML IM SUSP
150.0000 mg | Freq: Once | INTRAMUSCULAR | Status: AC
  Administered 2018-09-25: 150 mg via INTRAMUSCULAR

## 2018-09-25 NOTE — Addendum Note (Signed)
Addended by: Maretta Bees on: 09/25/2018 01:54 PM   Modules accepted: Orders

## 2018-09-25 NOTE — Progress Notes (Signed)
Presents for DEPO, given in LD, tolerated well. Next DEPO 12/23-01/05/2019.  Administrations This Visit    medroxyPROGESTERone (DEPO-PROVERA) injection 150 mg    Admin Date 09/25/2018 Action Given Dose 150 mg Route Intramuscular Administered By Maretta Bees, RMA

## 2018-10-02 IMAGING — US US OB COMP LESS 14 WK
1 series · 14 of 28 positions shown · non-contrast
Comparison: None.

CLINICAL DATA: 21-year-old pregnant female presenting for
evaluation of pregnancy and size and dating. LMP: 01/08/2018
corresponding to an estimated gestational age of 7 weeks, 2 days.

EXAM:
OBSTETRIC <14 WK ULTRASOUND
TECHNIQUE: Transabdominal ultrasound was performed for evaluation of the
gestation as well as the maternal uterus and adnexal regions.

[Series 1: us ob comp less 14 wk · 0.22mm/px · 36 acquisitions, 14 frames shown]
[im 2/36]
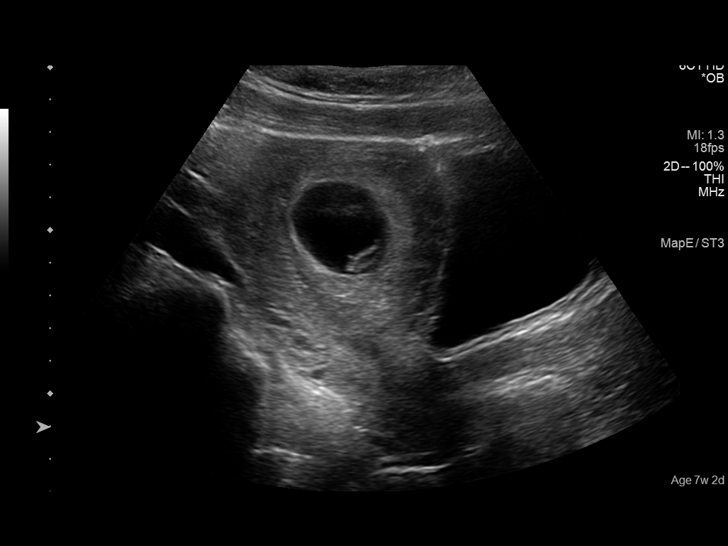
[im 4/36]
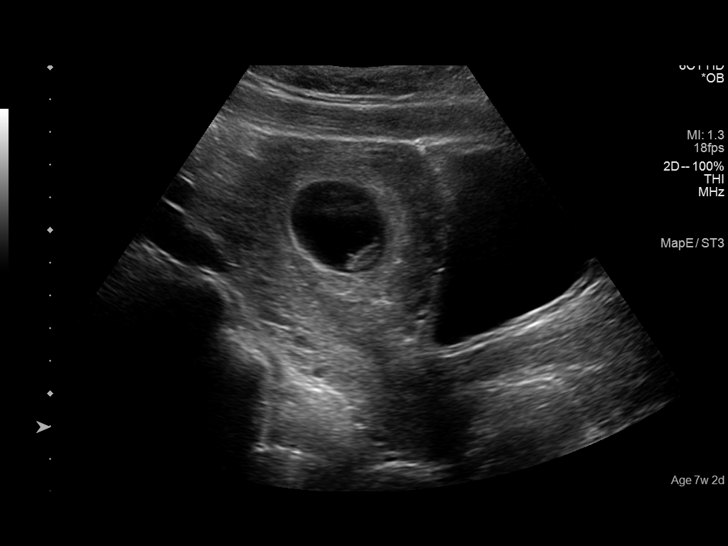
[im 7/36]
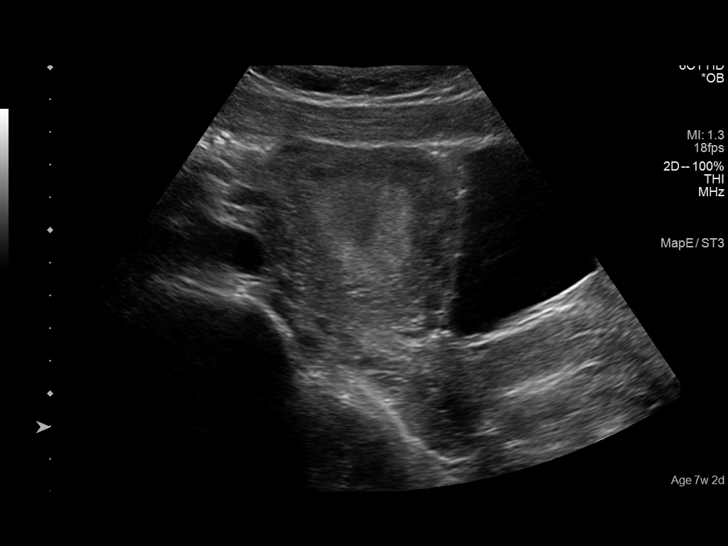
[im 10/36]
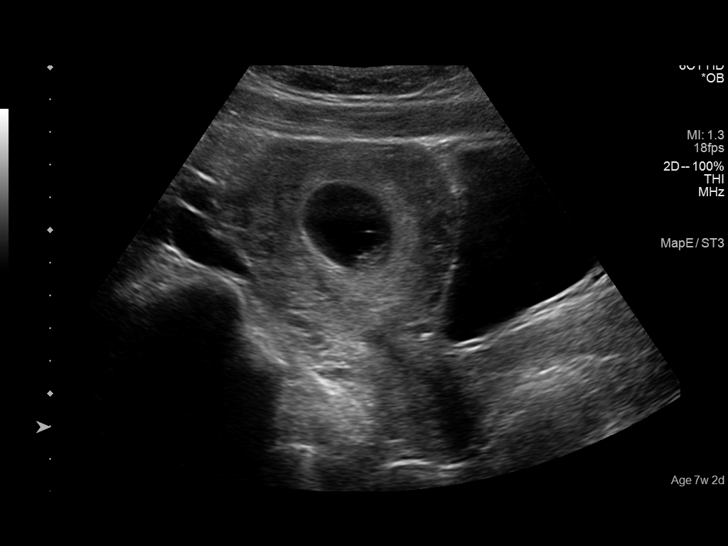
[im 12/36]
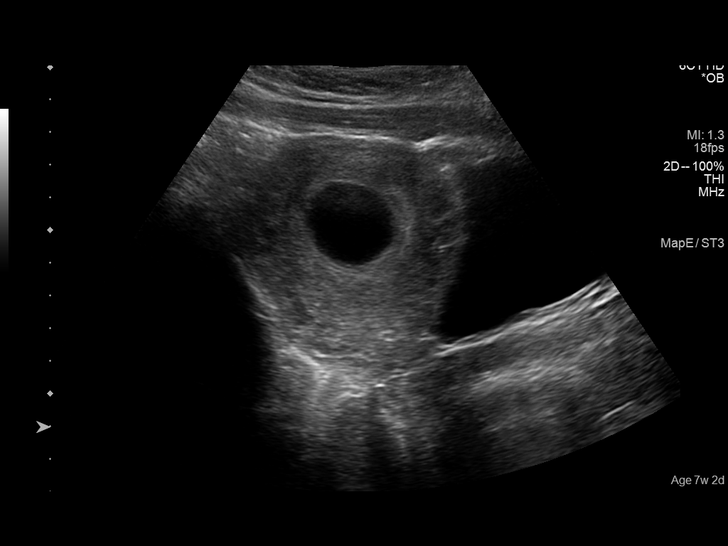
[im 15/36]
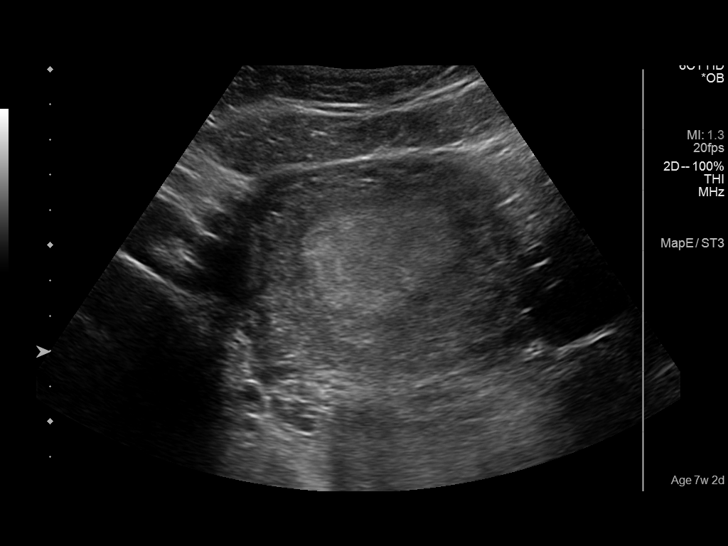
[im 17/36]
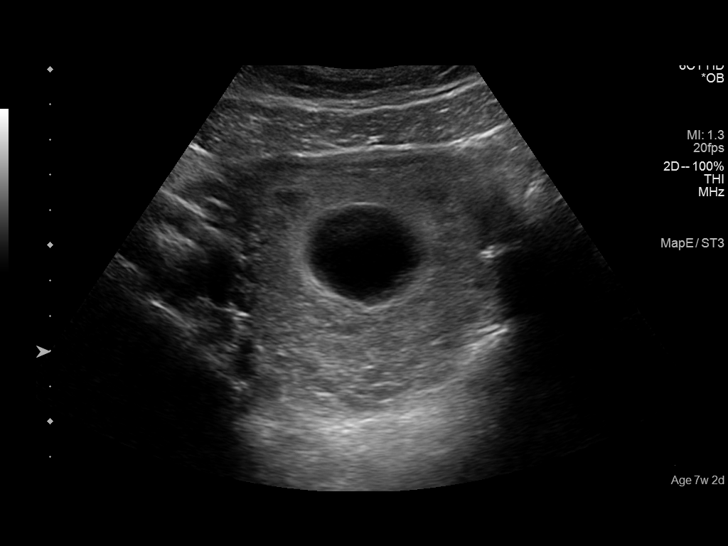
[im 20/36]
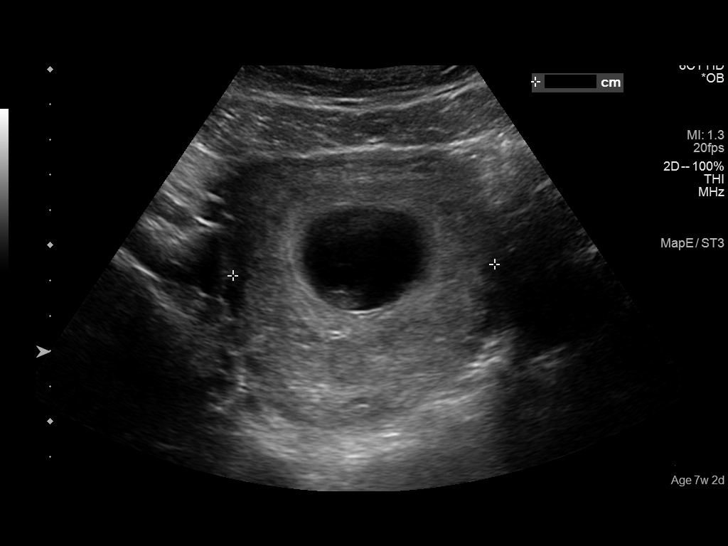
[im 23/36]
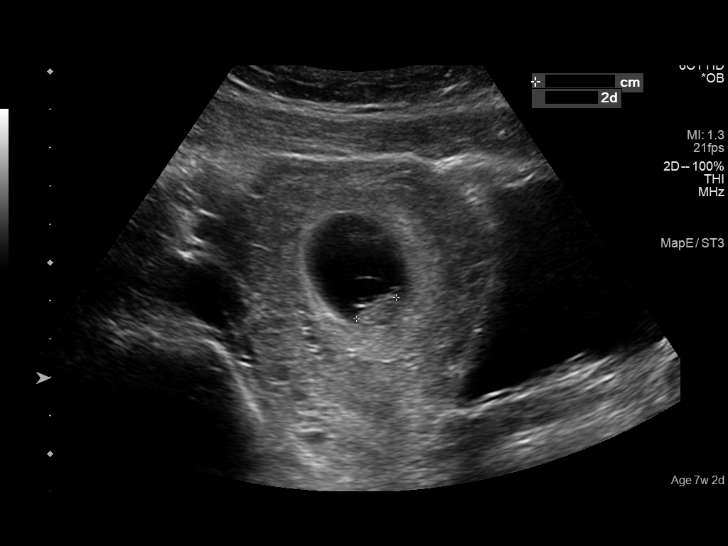
[im 25/36]
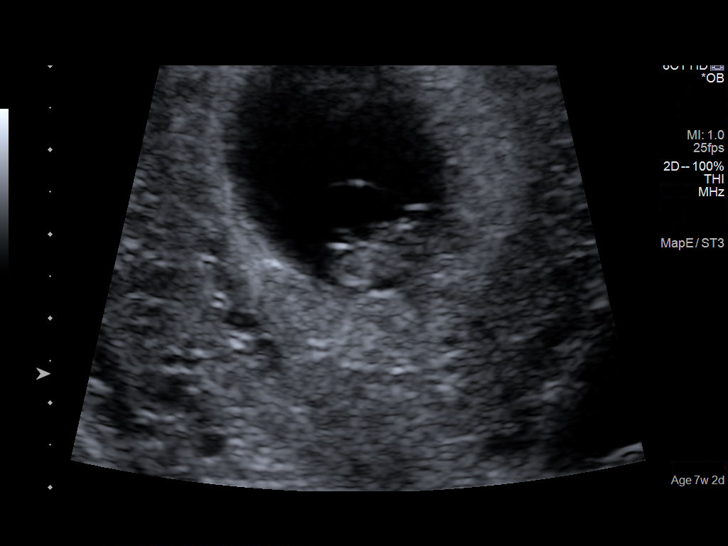
[im 28/36]
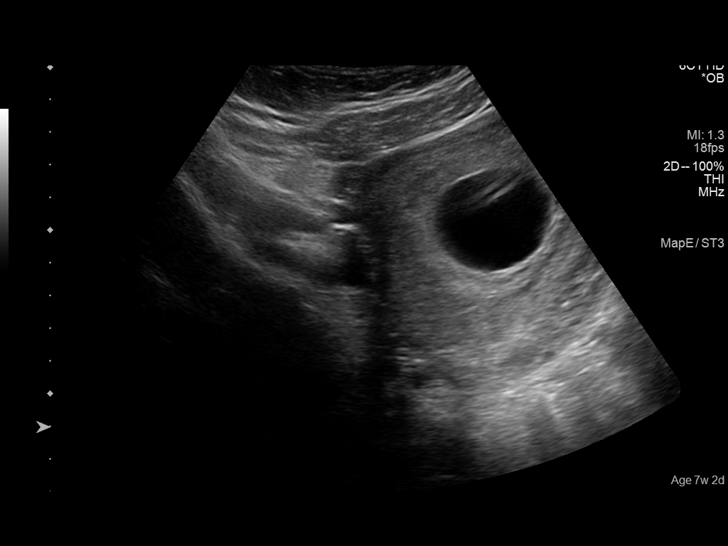
[im 30/36]
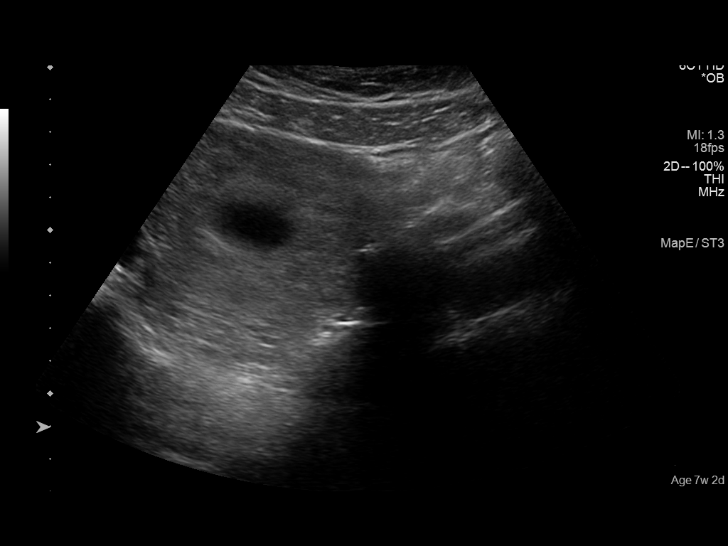
[im 33/36]
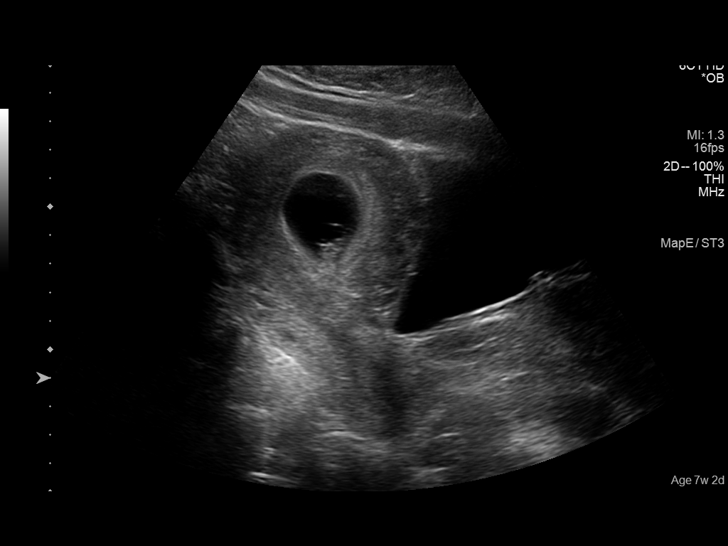
[im 36/36]
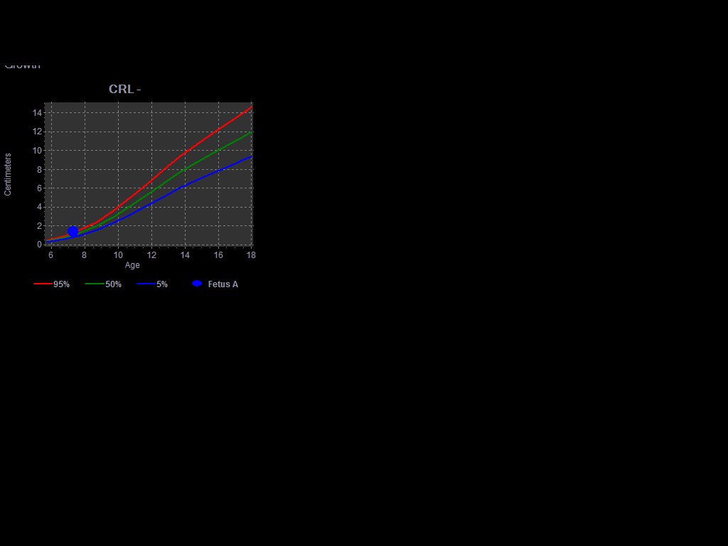

[14 of 28 positions shown; findings below may reference images not displayed]

FINDINGS: Intrauterine gestational sac: Single intrauterine gestational sac.

Yolk sac:  Seen

Embryo:  Present

Cardiac Activity: Detected

Heart Rate: 153 bpm

CRL: 14 mm   7 w 5 d                  US EDC: 10/12/2018

Subchorionic hemorrhage:  None visualized.

Maternal uterus/adnexae: The uterus is anteverted. The ovaries are
not visualized.
IMPRESSION: Single live intrauterine pregnancy with an estimated gestational age
of 7 weeks, 5 days.

## 2018-10-27 ENCOUNTER — Ambulatory Visit (INDEPENDENT_AMBULATORY_CARE_PROVIDER_SITE_OTHER): Payer: BLUE CROSS/BLUE SHIELD | Admitting: Obstetrics

## 2018-10-27 ENCOUNTER — Encounter: Payer: Self-pay | Admitting: Obstetrics

## 2018-10-27 ENCOUNTER — Ambulatory Visit: Payer: BLUE CROSS/BLUE SHIELD | Admitting: Obstetrics

## 2018-10-27 VITALS — BP 127/83 | HR 98 | Wt 152.6 lb

## 2018-10-27 DIAGNOSIS — Z113 Encounter for screening for infections with a predominantly sexual mode of transmission: Secondary | ICD-10-CM

## 2018-10-27 DIAGNOSIS — N9411 Superficial (introital) dyspareunia: Secondary | ICD-10-CM | POA: Diagnosis not present

## 2018-10-27 DIAGNOSIS — N760 Acute vaginitis: Secondary | ICD-10-CM | POA: Diagnosis not present

## 2018-10-27 DIAGNOSIS — B9689 Other specified bacterial agents as the cause of diseases classified elsewhere: Secondary | ICD-10-CM

## 2018-10-27 DIAGNOSIS — S3141XA Laceration without foreign body of vagina and vulva, initial encounter: Secondary | ICD-10-CM

## 2018-10-27 DIAGNOSIS — N898 Other specified noninflammatory disorders of vagina: Secondary | ICD-10-CM

## 2018-10-27 MED ORDER — TRIPLE ANTIBIOTIC 5-400-5000 EX OINT
TOPICAL_OINTMENT | Freq: Three times a day (TID) | CUTANEOUS | 2 refills | Status: DC
Start: 2018-10-27 — End: 2019-11-17

## 2018-10-27 NOTE — Progress Notes (Signed)
Pt is here with c/o pain during intercourse for the past 2 months. LMP one week ago, pt denies any abnormal discharge. Pt reports that she has had spotting in between periods. Is currently doing depo injections for contraception, last injection 09/25/18

## 2018-10-27 NOTE — Progress Notes (Signed)
Patient ID: Danielle Prince, female   DOB: 1996-06-22, 22 y.o.   MRN: [id number]  Chief Complaint  Patient presents with  . Dyspareunia    HPI Danielle Prince is a 22 y.o. female.  Complains of vaginal pain with intercourse.  She states that the pain is with entry. HPI  Past Medical History:  Diagnosis Date  . ADHD (attention deficit hyperactivity disorder)   . ADHD (attention deficit hyperactivity disorder)   . Anorexia nervosa with bulimia   . Anxiety   . Aspirin overdose 02/2012  . Depression   . Dysrhythmia   . Eating disorder   . Electrolyte disturbance 2010, 2012   hypokalemia, hypochloremia.  . Urinary tract infection   . Varicella   . Vision abnormalities     Past Surgical History:  Procedure Laterality Date  . COLONOSCOPY WITH ESOPHAGOGASTRODUODENOSCOPY (EGD) N/A 10/28/2013   Procedure: COLONOSCOPY WITH ESOPHAGOGASTRODUODENOSCOPY (EGD);  Surgeon: Beverley Fiedler, MD;  Location: [facility] ENDOSCOPY;  Service: Endoscopy;  Laterality: N/A;  . GUM SURGERY    . INDUCED ABORTION      Family History  Problem Relation Age of Onset  . Cancer Father   . Depression Sister   . Depression Paternal Aunt   . Drug abuse Maternal Uncle        deceased  . Drug abuse Paternal Aunt   . Colon cancer Maternal Grandmother     Social History Social History   Tobacco Use  . Smoking status: Current Some Day Smoker    Packs/day: 0.50    Types: Cigarettes  . Smokeless tobacco: Never Used  Substance Use Topics  . Alcohol use: Not Currently    Alcohol/week: 0.0 standard drinks  . Drug use: Not Currently    No Known Allergies  Current Outpatient Medications  Medication Sig Dispense Refill  . ARIPiprazole (ABILIFY) 10 MG tablet Take 10 mg by mouth daily.  2  . escitalopram (LEXAPRO) 10 MG tablet Take 10 mg by mouth every morning.  2  . gabapentin (NEURONTIN) 300 MG capsule Take 1 capsule (300 mg total) by mouth 3 (three) times daily. (Patient taking differently: Take 600 mg by mouth  daily. ) 90 capsule 0  . gentamicin (GARAMYCIN) 0.3 % ophthalmic solution INSTILL 1-2 DROPS TO AFFECTED EYE EVERY 4 HOURS WHILE AWAKE X 7 DAYS  0  . lamoTRIgine (LAMICTAL) 200 MG tablet     . medroxyPROGESTERone (DEPO-PROVERA) 150 MG/ML injection Inject 1 mL (150 mg total) into the muscle every 3 (three) months. 1 mL 3  . ARIPiprazole (ABILIFY) 2 MG tablet Take 1 tablet (2 mg total) by mouth daily. (Patient not taking: Reported on 10/27/2018) 30 tablet 0  . lamoTRIgine (LAMICTAL) 150 MG tablet Take 150 mg by mouth daily.    Marland Kitchen neomycin-bacitracin-polymyxin (NEOSPORIN) [phone] ointment Apply topically 3 (three) times daily. 28.3 g 2  . traZODone (DESYREL) 50 MG tablet Take 1 tablet (50 mg total) by mouth at bedtime and may repeat dose one time if needed. (Patient not taking: Reported on 10/27/2018) 30 tablet 0   No current facility-administered medications for this visit.     Review of Systems Review of Systems Constitutional: negative for fatigue and weight loss Respiratory: negative for cough and wheezing Cardiovascular: negative for chest pain, fatigue and palpitations Gastrointestinal: negative for abdominal pain and change in bowel habits Genitourinary:negative Integument/breast: negative for nipple discharge Musculoskeletal:negative for myalgias Neurological: negative for gait problems and tremors Behavioral/Psych: negative for abusive relationship, depression Endocrine: negative for temperature intolerance  Blood pressure 127/83, pulse 98, weight 152 lb 9.6 oz (69.2 kg), last menstrual period 10/20/2018.  Physical Exam Physical Exam           General:  Alert and no distress Abdomen:  normal findings: no organomegaly, soft, non-tender and no hernia  Pelvis:  External genitalia: laceration of introitus at 6 o'clock Urinary system: urethral meatus normal and bladder without fullness, nontender Vaginal: normal without tenderness, induration or masses Cervix: normal  appearance Adnexa: normal bimanual exam Uterus: anteverted and non-tender, normal size    50% of 20 min visit spent on counseling and coordination of care.   Data Reviewed Wet Prep  Assessment     1. Introital dyspareunia Rx: - Cervicovaginal ancillary only  2. Traumatic vaginal laceration, initial encounter Rx: - neomycin-bacitracin-polymyxin (NEOSPORIN) [phone] ointment; Apply topically 3 (three) times daily.  Dispense: 28.3 g; Refill: 2   Plan    Follow up prn    Meds ordered this encounter  Medications  . neomycin-bacitracin-polymyxin (NEOSPORIN) [phone] ointment    Sig: Apply topically 3 (three) times daily.    Dispense:  28.3 g    Refill:  2    Brock Bad MD 10-27-2018

## 2018-10-30 LAB — CERVICOVAGINAL ANCILLARY ONLY
Bacterial vaginitis: POSITIVE — AB
CANDIDA VAGINITIS: NEGATIVE
CHLAMYDIA, DNA PROBE: NEGATIVE
Neisseria Gonorrhea: NEGATIVE
Trichomonas: NEGATIVE

## 2018-10-31 ENCOUNTER — Other Ambulatory Visit: Payer: Self-pay | Admitting: Obstetrics

## 2018-10-31 DIAGNOSIS — B9689 Other specified bacterial agents as the cause of diseases classified elsewhere: Secondary | ICD-10-CM

## 2018-10-31 DIAGNOSIS — N760 Acute vaginitis: Principal | ICD-10-CM

## 2018-10-31 MED ORDER — TINIDAZOLE 500 MG PO TABS: 1000.0000 mg | ORAL_TABLET | Freq: Every day | ORAL | 2 refills | Status: DC

## 2018-12-14 ENCOUNTER — Ambulatory Visit (INDEPENDENT_AMBULATORY_CARE_PROVIDER_SITE_OTHER): Payer: BLUE CROSS/BLUE SHIELD

## 2018-12-14 VITALS — BP 122/71 | HR 95 | Wt 155.6 lb

## 2018-12-14 DIAGNOSIS — Z3042 Encounter for surveillance of injectable contraceptive: Secondary | ICD-10-CM

## 2018-12-14 MED ORDER — MEDROXYPROGESTERONE ACETATE 150 MG/ML IM SUSP
150.0000 mg | Freq: Once | INTRAMUSCULAR | Status: AC
  Administered 2018-12-14: 150 mg via INTRAMUSCULAR

## 2018-12-14 NOTE — Progress Notes (Signed)
Presents for DEPO, given in RD, tolerated well.  Next DEPO 3/13-27/2020  Administrations This Visit    medroxyPROGESTERone (DEPO-PROVERA) injection 150 mg    Admin Date 12/14/2018 Action Given Dose 150 mg Route Intramuscular Administered By Maretta BeesMcGlashan, Maze Corniel J, RMA

## 2019-01-02 DIAGNOSIS — F603 Borderline personality disorder: Secondary | ICD-10-CM | POA: Diagnosis not present

## 2019-01-02 DIAGNOSIS — S60521A Blister (nonthermal) of right hand, initial encounter: Secondary | ICD-10-CM | POA: Diagnosis not present

## 2019-01-02 DIAGNOSIS — Z113 Encounter for screening for infections with a predominantly sexual mode of transmission: Secondary | ICD-10-CM | POA: Diagnosis not present

## 2019-01-02 DIAGNOSIS — F1910 Other psychoactive substance abuse, uncomplicated: Secondary | ICD-10-CM | POA: Diagnosis not present

## 2019-01-31 DIAGNOSIS — F603 Borderline personality disorder: Secondary | ICD-10-CM | POA: Diagnosis not present

## 2019-03-18 DIAGNOSIS — S40812A Abrasion of left upper arm, initial encounter: Secondary | ICD-10-CM | POA: Diagnosis not present

## 2019-03-18 DIAGNOSIS — R590 Localized enlarged lymph nodes: Secondary | ICD-10-CM | POA: Diagnosis not present

## 2019-03-18 DIAGNOSIS — I998 Other disorder of circulatory system: Secondary | ICD-10-CM | POA: Diagnosis not present

## 2019-03-18 DIAGNOSIS — M79605 Pain in left leg: Secondary | ICD-10-CM | POA: Diagnosis not present

## 2019-03-21 DIAGNOSIS — F603 Borderline personality disorder: Secondary | ICD-10-CM | POA: Diagnosis not present

## 2019-03-22 ENCOUNTER — Ambulatory Visit: Payer: BLUE CROSS/BLUE SHIELD

## 2019-04-12 ENCOUNTER — Ambulatory Visit (INDEPENDENT_AMBULATORY_CARE_PROVIDER_SITE_OTHER): Payer: BLUE CROSS/BLUE SHIELD

## 2019-04-12 ENCOUNTER — Other Ambulatory Visit: Payer: Self-pay

## 2019-04-12 VITALS — BP 122/73 | HR 101 | Ht 70.0 in | Wt 170.0 lb

## 2019-04-12 DIAGNOSIS — Z3202 Encounter for pregnancy test, result negative: Secondary | ICD-10-CM

## 2019-04-12 LAB — POCT URINE PREGNANCY: Preg Test, Ur: NEGATIVE

## 2019-04-12 NOTE — Progress Notes (Signed)
Presented for 1st. UPT to restart DEPO.  UPT today is NEGATIVE.  Patient was told to return in 2 weeks for 2nd UPT and DEPO. Please abstain from sexual intercourse for the 2 weeks.  If UPT is Negative; DEPO will be given.

## 2019-04-26 ENCOUNTER — Other Ambulatory Visit: Payer: Self-pay

## 2019-04-26 ENCOUNTER — Ambulatory Visit (INDEPENDENT_AMBULATORY_CARE_PROVIDER_SITE_OTHER): Payer: BLUE CROSS/BLUE SHIELD

## 2019-04-26 DIAGNOSIS — N898 Other specified noninflammatory disorders of vagina: Secondary | ICD-10-CM

## 2019-04-26 DIAGNOSIS — B373 Candidiasis of vulva and vagina: Secondary | ICD-10-CM | POA: Diagnosis not present

## 2019-04-26 DIAGNOSIS — Z3202 Encounter for pregnancy test, result negative: Secondary | ICD-10-CM | POA: Diagnosis not present

## 2019-04-26 DIAGNOSIS — Z113 Encounter for screening for infections with a predominantly sexual mode of transmission: Secondary | ICD-10-CM

## 2019-04-26 DIAGNOSIS — Z3042 Encounter for surveillance of injectable contraceptive: Secondary | ICD-10-CM | POA: Diagnosis not present

## 2019-04-26 LAB — POCT URINE PREGNANCY: Preg Test, Ur: NEGATIVE

## 2019-04-26 MED ORDER — MEDROXYPROGESTERONE ACETATE 150 MG/ML IM SUSP
150.0000 mg | INTRAMUSCULAR | Status: DC
  Administered 2019-04-26: 150 mg via INTRAMUSCULAR

## 2019-04-26 NOTE — Progress Notes (Signed)
I have reviewed this chart and agree with the RN/CMA assessment and management.    K. Meryl Davis, M.D. Attending [facility] (Faculty Practice)   

## 2019-04-26 NOTE — Progress Notes (Signed)
Pt is in the office for depo injection, administered in [place], pt tolerated well. Next due 7/23- 8/6. Pt states she had unprotected sex recently and has some irritation. Pt desires self swab for std testing. .. Administrations This Visit    medroxyPROGESTERone (DEPO-PROVERA) injection 150 mg    Admin Date 04/26/2019 Action Given Dose 150 mg Route Intramuscular Administered By Katrina Stack, RN

## 2019-04-27 ENCOUNTER — Other Ambulatory Visit: Payer: Self-pay | Admitting: Obstetrics

## 2019-04-27 DIAGNOSIS — B3731 Acute candidiasis of vulva and vagina: Secondary | ICD-10-CM

## 2019-04-27 DIAGNOSIS — B373 Candidiasis of vulva and vagina: Secondary | ICD-10-CM

## 2019-04-27 LAB — CERVICOVAGINAL ANCILLARY ONLY
Bacterial vaginitis: NEGATIVE
Candida vaginitis: POSITIVE — AB
Chlamydia: NEGATIVE
Neisseria Gonorrhea: NEGATIVE
Trichomonas: NEGATIVE

## 2019-04-27 MED ORDER — FLUCONAZOLE 150 MG PO TABS: 150.0000 mg | ORAL_TABLET | Freq: Once | ORAL | 0 refills | Status: AC

## 2019-05-09 DIAGNOSIS — R5381 Other malaise: Secondary | ICD-10-CM | POA: Diagnosis not present

## 2019-05-09 DIAGNOSIS — Z724 Inappropriate diet and eating habits: Secondary | ICD-10-CM | POA: Diagnosis not present

## 2019-05-09 DIAGNOSIS — R197 Diarrhea, unspecified: Secondary | ICD-10-CM | POA: Diagnosis not present

## 2019-05-09 DIAGNOSIS — F1510 Other stimulant abuse, uncomplicated: Secondary | ICD-10-CM | POA: Diagnosis not present

## 2019-07-03 ENCOUNTER — Encounter: Payer: Self-pay | Admitting: Internal Medicine

## 2019-07-03 ENCOUNTER — Other Ambulatory Visit: Payer: Self-pay

## 2019-07-03 ENCOUNTER — Ambulatory Visit: Payer: BC Managed Care – PPO | Admitting: Internal Medicine

## 2019-07-03 ENCOUNTER — Ambulatory Visit (HOSPITAL_COMMUNITY)
Admission: RE | Admit: 2019-07-03 | Discharge: 2019-07-03 | Disposition: A | Discharge status: Home / Self Care | Payer: BC Managed Care – PPO | Source: Ambulatory Visit | Attending: Internal Medicine | Admitting: Internal Medicine

## 2019-07-03 VITALS — BP 143/128 | HR 102 | Temp 98.5°F | Ht 70.0 in | Wt 174.6 lb

## 2019-07-03 DIAGNOSIS — F329 Major depressive disorder, single episode, unspecified: Secondary | ICD-10-CM

## 2019-07-03 DIAGNOSIS — R5383 Other fatigue: Secondary | ICD-10-CM | POA: Insufficient documentation

## 2019-07-03 DIAGNOSIS — F1990 Other psychoactive substance use, unspecified, uncomplicated: Secondary | ICD-10-CM | POA: Insufficient documentation

## 2019-07-03 DIAGNOSIS — R079 Chest pain, unspecified: Secondary | ICD-10-CM | POA: Diagnosis not present

## 2019-07-03 DIAGNOSIS — Z79899 Other long term (current) drug therapy: Secondary | ICD-10-CM | POA: Diagnosis not present

## 2019-07-03 DIAGNOSIS — F17210 Nicotine dependence, cigarettes, uncomplicated: Secondary | ICD-10-CM | POA: Diagnosis not present

## 2019-07-03 DIAGNOSIS — R5381 Other malaise: Secondary | ICD-10-CM

## 2019-07-03 DIAGNOSIS — F32A Depression, unspecified: Secondary | ICD-10-CM

## 2019-07-03 DIAGNOSIS — F339 Major depressive disorder, recurrent, unspecified: Secondary | ICD-10-CM

## 2019-07-03 DIAGNOSIS — F419 Anxiety disorder, unspecified: Secondary | ICD-10-CM

## 2019-07-03 DIAGNOSIS — G8929 Other chronic pain: Secondary | ICD-10-CM | POA: Diagnosis not present

## 2019-07-03 NOTE — Patient Instructions (Addendum)
It was our pleasure taking care of you in our clinic today.  You were seen you for check up and also chest tightness that you have had in past few months. Your EKG did not show any concerning finding. I send you for heart ultrasound for further evaluation. I highly encourage you to go for rehab center and stop using drug as it can have serious harm for you including heart disease. We also do some blood work today and call you if the result will be abnormal. I did not make any changes to your medications today.  Please take your medications as before and contact us if you have any question or concern.  Please come back to clinic in 1-2 months for re-evaluation or earlier if your symptoms get worse or not improved. As always, if having severe symptoms, please seek medical attention at emergency room.  Thanks, Dr. Linna Hoff

## 2019-07-03 NOTE — Assessment & Plan Note (Addendum)
Patient reports being exhasted all the time. Will check CBC and TSH.   -CBC  -TSH

## 2019-07-03 NOTE — Progress Notes (Signed)
   CC: visit to establish care and to discuss chest pain  HPI:  Danielle Prince is a 23 y.o. with PMHx as documented below, presented for establishing care and also complains of chronic chest pain. Please refer to problem based charting for further details and assessment of plan of current problem and chronic medical conditions.   Past Medical History:  Diagnosis Date  . ADHD (attention deficit hyperactivity disorder)   . ADHD (attention deficit hyperactivity disorder)   . Anorexia nervosa with bulimia   . Anxiety   . Aspirin overdose 02/2012  . Depression   . Dysrhythmia   . Eating disorder   . Electrolyte disturbance 2010, 2012   hypokalemia, hypochloremia.  . Urinary tract infection   . Varicella   . Vision abnormalities    Family Hx: Father with cancer Sister with depression Maternal grandfather with colon cancer  Social Hx: She currently lives with her mother and her sister Does not work currently due to COVID-19 situation Smokes cigarette half a pack a day Uses IV methamphetamine    Review of Systems:  Review of Systems  Constitutional: Positive for malaise/fatigue. Negative for chills and fever.  Respiratory: Negative for shortness of breath.   Cardiovascular: Positive for chest pain. Negative for leg swelling.  Gastrointestinal: Negative for abdominal pain, blood in stool, constipation, diarrhea, nausea and vomiting.  Neurological: Negative for dizziness and headaches.    Physical Exam:  Vitals:   07/03/19 0918  BP: (!) 143/128  Pulse: (!) 102  Temp: 98.5 F (36.9 C)  TempSrc: Oral  SpO2: 99%  Weight: 174 lb 9.6 oz (79.2 kg)  Height: 5\' 10"  (1.778 m)   Physical Exam Constitutional:      General: She is not in acute distress.    Appearance: Normal appearance. She is not ill-appearing.  HENT:     Head: Normocephalic and atraumatic.  Eyes:     Extraocular Movements: Extraocular movements intact.     Conjunctiva/sclera: Conjunctivae normal.   Cardiovascular:     Rate and Rhythm: Regular rhythm. Tachycardia present.     Heart sounds: No murmur.  Pulmonary:     Effort: Pulmonary effort is normal.     Breath sounds: Normal breath sounds. No wheezing, rhonchi or rales.  Abdominal:     General: Bowel sounds are normal. There is no distension.     Palpations: Abdomen is soft.     Tenderness: There is no abdominal tenderness. There is no guarding.  Neurological:     General: No focal deficit present.     Mental Status: She is alert and oriented to person, place, and time.  Psychiatric:        Mood and Affect: Mood is anxious.        Behavior: Behavior normal.        Thought Content: Thought content normal.        Judgment: Judgment normal.     Comments: Slightly anxcious     Assessment & Plan:   See Encounters Tab for problem based charting.  Patient discussed with Dr. Angelia Mould

## 2019-07-03 NOTE — Assessment & Plan Note (Addendum)
Patient reports 2-3 months Hx of chest tightness 4/10 that worsens with activity and last for <1 h. Also has dyspnea on exesion. Currently reports some heart burn/dismofort. CV and lung exam are unremarkable. Considering her young age, less likely to be cardiac atherosclerotic disease. How ever, given Hx of IV drug use, is at risk for valvular disease and cardiomyopathy and will evaluate for echo.  -EKG--> Unremarkable -TTE

## 2019-07-03 NOTE — Assessment & Plan Note (Addendum)
She is on multiple medications for her depression and mentions that she tries to see her psychiatrist every 3 months. She reports Hx of self harm and cutting her arm superficialy but mentions that it does not usually happen and she controls it now. She denies any suicidal ideation. -Encouraged to keep follow up with psychiatrist and take her medications as instructed.

## 2019-07-04 ENCOUNTER — Telehealth: Payer: Self-pay | Admitting: Internal Medicine

## 2019-07-04 LAB — CBC WITH DIFFERENTIAL/PLATELET
Basophils Absolute: 0 10*3/uL (ref 0.0–0.2)
Basos: 1 %
EOS (ABSOLUTE): 0.2 10*3/uL (ref 0.0–0.4)
Eos: 4 %
Hematocrit: 34.2 % (ref 34.0–46.6)
Hemoglobin: 10.4 g/dL — ABNORMAL LOW (ref 11.1–15.9)
Immature Grans (Abs): 0 10*3/uL (ref 0.0–0.1)
Immature Granulocytes: 0 %
Lymphocytes Absolute: 2.1 10*3/uL (ref 0.7–3.1)
Lymphs: 36 %
MCH: 23.9 pg — ABNORMAL LOW (ref 26.6–33.0)
MCHC: 30.4 g/dL — ABNORMAL LOW (ref 31.5–35.7)
MCV: 78 fL — ABNORMAL LOW (ref 79–97)
Monocytes Absolute: 0.3 10*3/uL (ref 0.1–0.9)
Monocytes: 6 %
Neutrophils Absolute: 3.2 10*3/uL (ref 1.4–7.0)
Neutrophils: 53 %
Platelets: 339 10*3/uL (ref 150–450)
RBC: 4.36 x10E6/uL (ref 3.77–5.28)
RDW: 14.6 % (ref 11.7–15.4)
WBC: 5.9 10*3/uL (ref 3.4–10.8)

## 2019-07-04 LAB — HIV ANTIBODY (ROUTINE TESTING W REFLEX): HIV Screen 4th Generation wRfx: NONREACTIVE

## 2019-07-04 LAB — HEPATITIS C ANTIBODY: Hep C Virus Ab: <0.1 s/co ratio (ref 0.0–0.9)

## 2019-07-04 LAB — TSH: TSH: 2.19 u[IU]/mL (ref 0.450–4.500)

## 2019-07-04 NOTE — Telephone Encounter (Signed)
I called Ms. Long to inform her about her negative HIV and Hep C test and also about anemia on CBC. No answer and no had no voice mail. Hb stable at 10, recommending Iron panel.

## 2019-07-05 NOTE — Assessment & Plan Note (Signed)
We discussed major harm of IV drug use and importance of quitting that. 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 She plan to go to rehab center in [place] soon.  Encouraged her decision and she is aware that she can ask for support from [place] meanwhile.

## 2019-07-09 ENCOUNTER — Telehealth: Payer: Self-pay | Admitting: Internal Medicine

## 2019-07-09 ENCOUNTER — Other Ambulatory Visit: Payer: Self-pay | Admitting: Internal Medicine

## 2019-07-09 DIAGNOSIS — D509 Iron deficiency anemia, unspecified: Secondary | ICD-10-CM | POA: Insufficient documentation

## 2019-07-09 MED ORDER — FERROUS SULFATE 325 (65 FE) MG PO TABS: 325.0000 mg | ORAL_TABLET | Freq: Every day | ORAL | 1 refills | Status: DC

## 2019-07-09 NOTE — Progress Notes (Signed)
Internal Medicine Clinic Attending  Case discussed with Dr. Masoudi  at the time of the visit.  We reviewed the resident's history and exam and pertinent patient test results.  I agree with the assessment, diagnosis, and plan of care documented in the resident's note.  

## 2019-07-09 NOTE — Telephone Encounter (Signed)
I called Ms. Danielle Prince again to update her about her lab result and also to inform her about Ferrous sulfate prescription that I sent to her pharmacy. No answer and the voice mail is full.

## 2019-07-10 ENCOUNTER — Telehealth: Payer: Self-pay | Admitting: *Deleted

## 2019-07-10 NOTE — Telephone Encounter (Signed)
CALLED PATIENT UNABLE TO LEAVE MESSAGE PHONE FULL.  PATIENT HAS INSURANCE. MAILING LISTING FOR [facility]AL PRACTICES SHE CAN CALL TO MAKE APPOINTMENT.

## 2019-07-11 ENCOUNTER — Telehealth: Payer: Self-pay

## 2019-07-11 ENCOUNTER — Telehealth (HOSPITAL_COMMUNITY): Payer: Self-pay | Admitting: Radiology

## 2019-07-11 NOTE — Telephone Encounter (Signed)
Requesting lab results, per patient she want the results to be left on the VM. Please call pt back.

## 2019-07-11 NOTE — Telephone Encounter (Signed)
Left message to call office-Patient needs to schedule an echocardiogram.  

## 2019-07-16 ENCOUNTER — Ambulatory Visit: Payer: BC Managed Care – PPO

## 2019-07-23 ENCOUNTER — Telehealth: Payer: Self-pay | Admitting: *Deleted

## 2019-07-23 ENCOUNTER — Telehealth: Payer: Self-pay | Admitting: Obstetrics

## 2019-07-23 NOTE — Telephone Encounter (Signed)
Pt is calling again for lab results; stated she's in a rehab program and to leave message on her voice mail. She was seen on 7/14 by Dr Myrtie Hawk and had called her on 7/15, no vm.

## 2019-07-24 ENCOUNTER — Telehealth: Payer: Self-pay | Admitting: Internal Medicine

## 2019-07-24 NOTE — Telephone Encounter (Signed)
I called Ajwa's mother after received a call.  Answered her questions about lab result and discussed Iron deficiency anemia. She has picked up Ferrous sulfate and is taking that. Recommended to f/u in clinic in 3 months. Also discussed her eating disorder and all of questions and concerns were addressed. I added patient's cell phone number ([phone]) in the chart.

## 2019-07-24 NOTE — Telephone Encounter (Signed)
I reviewed her 7/14 labs. Dr Myrtie Hawk will need to talk to pt about her iron def anemia. Will defer to her.

## 2019-07-24 NOTE — Telephone Encounter (Signed)
See today's telephone encounter from Dr Myrtie Hawk.

## 2019-07-25 ENCOUNTER — Encounter (HOSPITAL_COMMUNITY): Payer: Self-pay | Admitting: Internal Medicine

## 2019-09-03 ENCOUNTER — Telehealth (HOSPITAL_COMMUNITY): Payer: Self-pay

## 2019-09-03 NOTE — Telephone Encounter (Signed)
New message    Just an FYI. We have made several attempts to contact this patient including sending a letter to schedule or reschedule their echocardiogram. We will be removing the patient from the echo WQ.   8.5.20 mail reminder letter Annelyse Rey  7.14.20 @ 2:32pm lm on home vm - Batool Majid 07/11/19 LMOM evd

## 2019-10-31 ENCOUNTER — Other Ambulatory Visit: Payer: Self-pay | Admitting: Obstetrics

## 2019-11-16 ENCOUNTER — Inpatient Hospital Stay (HOSPITAL_COMMUNITY)
Admission: AD | Admit: 2019-11-16 | Discharge: 2019-11-22 | DRG: 885 | Disposition: A | Discharge status: Home / Self Care | Payer: BC Managed Care – PPO | Attending: Psychiatry | Admitting: Psychiatry

## 2019-11-16 ENCOUNTER — Encounter (HOSPITAL_COMMUNITY): Payer: Self-pay

## 2019-11-16 ENCOUNTER — Emergency Department (HOSPITAL_COMMUNITY)
Admission: EM | Admit: 2019-11-16 | Discharge: 2019-11-16 | Disposition: A | Discharge status: Other Acute Inpatient Hospital | Payer: BC Managed Care – PPO | Source: Home / Self Care | Attending: Emergency Medicine | Admitting: Emergency Medicine

## 2019-11-16 DIAGNOSIS — F3110 Bipolar disorder, current episode manic without psychotic features, unspecified: Secondary | ICD-10-CM | POA: Diagnosis present

## 2019-11-16 DIAGNOSIS — F312 Bipolar disorder, current episode manic severe with psychotic features: Secondary | ICD-10-CM

## 2019-11-16 DIAGNOSIS — F19959 Other psychoactive substance use, unspecified with psychoactive substance-induced psychotic disorder, unspecified: Secondary | ICD-10-CM | POA: Diagnosis not present

## 2019-11-16 DIAGNOSIS — F15959 Other stimulant use, unspecified with stimulant-induced psychotic disorder, unspecified: Secondary | ICD-10-CM | POA: Diagnosis not present

## 2019-11-16 DIAGNOSIS — F419 Anxiety disorder, unspecified: Secondary | ICD-10-CM | POA: Diagnosis not present

## 2019-11-16 DIAGNOSIS — G4700 Insomnia, unspecified: Secondary | ICD-10-CM | POA: Diagnosis present

## 2019-11-16 DIAGNOSIS — R45851 Suicidal ideations: Secondary | ICD-10-CM | POA: Diagnosis present

## 2019-11-16 DIAGNOSIS — Z20828 Contact with and (suspected) exposure to other viral communicable diseases: Secondary | ICD-10-CM | POA: Diagnosis not present

## 2019-11-16 DIAGNOSIS — F1520 Other stimulant dependence, uncomplicated: Secondary | ICD-10-CM

## 2019-11-16 DIAGNOSIS — F319 Bipolar disorder, unspecified: Principal | ICD-10-CM | POA: Diagnosis present

## 2019-11-16 DIAGNOSIS — F17210 Nicotine dependence, cigarettes, uncomplicated: Secondary | ICD-10-CM | POA: Diagnosis present

## 2019-11-16 DIAGNOSIS — F15251 Other stimulant dependence with stimulant-induced psychotic disorder with hallucinations: Secondary | ICD-10-CM | POA: Diagnosis not present

## 2019-11-16 DIAGNOSIS — Z79899 Other long term (current) drug therapy: Secondary | ICD-10-CM | POA: Insufficient documentation

## 2019-11-16 DIAGNOSIS — Z03818 Encounter for observation for suspected exposure to other biological agents ruled out: Secondary | ICD-10-CM | POA: Diagnosis not present

## 2019-11-16 LAB — COMPREHENSIVE METABOLIC PANEL
ALT: 16 U/L (ref 0–44)
AST: 18 U/L (ref 15–41)
Albumin: 4.1 g/dL (ref 3.5–5.0)
Alkaline Phosphatase: 70 U/L (ref 38–126)
Anion gap: 12 (ref 5–15)
BUN: 11 mg/dL (ref 6–20)
CO2: 20 mmol/L — ABNORMAL LOW (ref 22–32)
Calcium: 9.2 mg/dL (ref 8.9–10.3)
Chloride: 105 mmol/L (ref 98–111)
Creatinine, Ser: 0.85 mg/dL (ref 0.44–1.00)
GFR calc Af Amer: >60 mL/min (ref >60)
GFR calc non Af Amer: >60 mL/min (ref >60)
Glucose, Bld: 108 mg/dL — ABNORMAL HIGH (ref 70–99)
Potassium: 3.8 mmol/L (ref 3.5–5.1)
Sodium: 137 mmol/L (ref 135–145)
Total Bilirubin: 0.4 mg/dL (ref 0.3–1.2)
Total Protein: 7.5 g/dL (ref 6.5–8.1)

## 2019-11-16 LAB — CBC WITH DIFFERENTIAL/PLATELET
Abs Immature Granulocytes: 0.01 10*3/uL (ref 0.00–0.07)
Basophils Absolute: 0.1 10*3/uL (ref 0.0–0.1)
Basophils Relative: 1 %
Eosinophils Absolute: 0.2 10*3/uL (ref 0.0–0.5)
Eosinophils Relative: 3 %
HCT: 43 % (ref 36.0–46.0)
Hemoglobin: 14 g/dL (ref 12.0–15.0)
Immature Granulocytes: 0 %
Lymphocytes Relative: 36 %
Lymphs Abs: 2.4 10*3/uL (ref 0.7–4.0)
MCH: 28.6 pg (ref 26.0–34.0)
MCHC: 32.6 g/dL (ref 30.0–36.0)
MCV: 87.8 fL (ref 80.0–100.0)
Monocytes Absolute: 0.5 10*3/uL (ref 0.1–1.0)
Monocytes Relative: 8 %
Neutro Abs: 3.5 10*3/uL (ref 1.7–7.7)
Neutrophils Relative %: 52 %
Platelets: 223 10*3/uL (ref 150–400)
RBC: 4.9 MIL/uL (ref 3.87–5.11)
RDW: 14.2 % (ref 11.5–15.5)
WBC: 6.7 10*3/uL (ref 4.0–10.5)
nRBC: 0 % (ref 0.0–0.2)

## 2019-11-16 LAB — ETHANOL: Alcohol, Ethyl (B): <10 mg/dL (ref <10)

## 2019-11-16 LAB — PREGNANCY, URINE: Preg Test, Ur: NEGATIVE

## 2019-11-16 LAB — RAPID URINE DRUG SCREEN, HOSP PERFORMED
Amphetamines: POSITIVE — AB
Barbiturates: NOT DETECTED
Benzodiazepines: NOT DETECTED
Cocaine: NOT DETECTED
Opiates: NOT DETECTED
Tetrahydrocannabinol: NOT DETECTED

## 2019-11-16 LAB — SARS CORONAVIRUS 2 BY RT PCR (HOSPITAL ORDER, PERFORMED IN ~~LOC~~ HOSPITAL LAB): SARS Coronavirus 2: NEGATIVE

## 2019-11-16 MED ORDER — LORAZEPAM 2 MG/ML IJ SOLN
INTRAMUSCULAR | Status: AC
  Filled 2019-11-16: qty 1

## 2019-11-16 MED ORDER — HYDROXYZINE HCL 25 MG PO TABS: 25.0000 mg | ORAL_TABLET | Freq: Three times a day (TID) | ORAL | Status: DC | PRN

## 2019-11-16 MED ORDER — TRAZODONE HCL 50 MG PO TABS: 50.0000 mg | ORAL_TABLET | Freq: Every evening | ORAL | Status: DC | PRN

## 2019-11-16 MED ORDER — ACETAMINOPHEN 325 MG PO TABS: 650.0000 mg | ORAL_TABLET | Freq: Four times a day (QID) | ORAL | Status: DC | PRN

## 2019-11-16 MED ORDER — LORAZEPAM 2 MG/ML IJ SOLN
2.0000 mg | Freq: Once | INTRAMUSCULAR | Status: AC
  Administered 2019-11-17: 2 mg via INTRAMUSCULAR
  Filled 2019-11-16: qty 1

## 2019-11-16 MED ORDER — DIPHENHYDRAMINE HCL 50 MG/ML IJ SOLN
50.0000 mg | Freq: Once | INTRAMUSCULAR | Status: AC
  Administered 2019-11-17: 50 mg via INTRAMUSCULAR
  Filled 2019-11-16: qty 1

## 2019-11-16 MED ORDER — MAGNESIUM HYDROXIDE 400 MG/5ML PO SUSP: 30.0000 mL | Freq: Every day | ORAL | Status: DC | PRN

## 2019-11-16 MED ORDER — ALUM & MAG HYDROXIDE-SIMETH 200-200-20 MG/5ML PO SUSP: 30.0000 mL | ORAL | Status: DC | PRN

## 2019-11-16 MED ORDER — DIPHENHYDRAMINE HCL 50 MG/ML IJ SOLN
INTRAMUSCULAR | Status: AC
  Filled 2019-11-16: qty 1

## 2019-11-16 NOTE — ED Notes (Signed)
Pt informed that she is being transfered to [facility] became very angry and verbally abusive. While in bed pt was completely covered with bed lines but once told she had no choice about the admission it was noted the pt was in street clothes. She then asked to change into hospital burgundy scrubs she became very animated and verbally angry and abusive. She threw milk that she had in her room on nurse station and repeatedly disobeyed redirection of staff and police officers who were here to transport her to [facility].  Pt had to be hand cuff for safe transport d/t  Loss of self control, yelling and verbal threatening and aggressive bx.

## 2019-11-16 NOTE — ED Notes (Signed)
Pt uncooperative, angry, irritable. Pt refused to change out.

## 2019-11-16 NOTE — ED Triage Notes (Signed)
Pt presents via [employer] under IVC papers. IVC papers read    "The respondent has been diagnosed as bipolar, depression, anxiety, and substance abuse. The respondent has been prescribed grabapentin, lamotrigine, naltrexane, escitalopram, aripiprazole and trazadone which she refused to take. The respondent is using methamphetamine. The respondent reports suicidal ideations but does not give a plan to do so. The respondent says she has nothing to live for. The respondent is paranoid and thinks there are things in the home that are spying on her. The respondent is disabled the home wi-fi system. The respondent states that a sinister message appeared on the refrigerator and in the wood grains of the furniture. The respondent reports auditory hallucinations and syas she hears loud music. There is no music in the home. The respondent has a history of commitment and is a danger to herself."  Pt is very upset and tearful in triage. Cussing at police officers. When asked about SI, pt says she does not have to answer any questions.
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
## 2019-11-16 NOTE — ED Provider Notes (Signed)
[facility]-EMERGENCY DEPT Provider Note   CSN: [id number] Arrival date & time: 11/16/19  1418     History   Chief Complaint Chief Complaint  Patient presents with  . IVC  . Addiction Problem  . Depression    HPI Galen ManilaMcKenzie Astorino is a 23 y.o. female presenting to the ED via [employer] under IVC per patient's mother. Pt lives with her mother, who carried out IVC paperwork.  Her IVC documentation, patient has been noncompliant with her psychiatric medications.  She is expressing paranoia about things in the home spying on her as well as reporting suicidal ideations without a plan.  She is also reportedly experiencing auditory hallucinations about loud music in the house as well as seeing "sinister messages" appearing.  Evaluation, she endorses recent issues with her "personal life" and admits to not taking her medications regularly.  She also reports methamphetamine use.  Denies alcohol or other drug use.      HPI  Past Medical History:  Diagnosis Date  . ADHD (attention deficit hyperactivity disorder)   . ADHD (attention deficit hyperactivity disorder)   . Anorexia nervosa with bulimia   . Anxiety   . Aspirin overdose 02/2012  . Depression   . Dysrhythmia   . Eating disorder   . Electrolyte disturbance 2010, 2012   hypokalemia, hypochloremia.  . Urinary tract infection   . Varicella   . Vision abnormalities     Patient Active Problem List   Diagnosis Date Noted  . Microcytic anemia 07/09/2019  . Chest pain 07/03/2019  . IV drug user 07/03/2019  . Fatigue 07/03/2019  . Depression   . MDD (major depressive disorder), recurrent episode (HCC) 02/09/2017  . Dyspareunia 04/15/2014  . Esophagitis, acute 10/28/2013  . Candidiasis of vulva and vagina 08/28/2013  . Vaginitis and vulvovaginitis, unspecified 06/26/2013  . Generalized anxiety disorder 03/02/2012  . Oppositional defiant disorder 03/02/2012  . Bulimia nervosa 02/26/2012  . Severe episode of  recurrent major depressive disorder, without psychotic features (HCC) 02/26/2012  . Urinary tract infection   . Vision abnormalities     Past Surgical History:  Procedure Laterality Date  . COLONOSCOPY WITH ESOPHAGOGASTRODUODENOSCOPY (EGD) N/A 10/28/2013   Procedure: COLONOSCOPY WITH ESOPHAGOGASTRODUODENOSCOPY (EGD);  Surgeon: Beverley FiedlerJay M Pyrtle, MD;  [facility];  Service: Endoscopy;  Laterality: N/A;  . GUM SURGERY    . INDUCED ABORTION       OB History    Gravida  2   Para  0   Term  0   Preterm  0   AB  2   Living  0     SAB  0   TAB  2   Ectopic  0   Multiple  0   Live Births               Home Medications    Prior to Admission medications   Medication Sig Start Date End Date Taking? Authorizing Provider  ARIPiprazole (ABILIFY) 15 MG tablet Take 15 mg by mouth at bedtime.  07/04/18   [provider]  ARIPiprazole (ABILIFY) 2 MG tablet Take 1 tablet (2 mg total) by mouth daily. Patient not taking: Reported on 10/27/2018 02/14/17   Rankin, Shuvon B, NP  ciprofloxacin (CILOXAN) 0.3 % ophthalmic solution Place 1 drop into both eyes 2 (two) times daily. 09/27/19   [provider]  escitalopram (LEXAPRO) 20 MG tablet Take 20 mg by mouth daily.  07/04/18   [provider]  ferrous sulfate  325 (65 FE) MG tablet Take 1 tablet (325 mg total) by mouth daily. 07/09/19 07/08/20  Masoudi, Dorthula Rue, MD  gabapentin (NEURONTIN) 300 MG capsule Take 1 capsule (300 mg total) by mouth 3 (three) times daily. 02/13/17   Rankin, Shuvon B, NP  gentamicin (GARAMYCIN) 0.3 % ophthalmic solution Place 1-2 drops into both eyes every 4 (four) hours. Only instill drop into affected eye x7days while awake. 10/04/18   [provider]  lamoTRIgine (LAMICTAL) 200 MG tablet Take 200 mg by mouth daily.  09/19/18   [provider]  medroxyPROGESTERone (DEPO-PROVERA) 150 MG/ML injection INJECT 1 ML (150 MG TOTAL) INTO THE MUSCLE EVERY 3 (THREE) MONTHS.  10/31/19   Shelly Bombard, MD  naltrexone (DEPADE) 50 MG tablet Take 50 mg by mouth daily. 08/28/19   [provider]  neomycin-bacitracin-polymyxin (NEOSPORIN) [phone] ointment Apply topically 3 (three) times daily. 10/27/18   Shelly Bombard, MD  tinidazole (TINDAMAX) 500 MG tablet Take 2 tablets (1,000 mg total) by mouth daily with breakfast. Patient not taking: Reported on 04/26/2019 10/31/18   Shelly Bombard, MD  traZODone (DESYREL) 50 MG tablet Take 1 tablet (50 mg total) by mouth at bedtime and may repeat dose one time if needed. Patient not taking: Reported on 10/27/2018 02/13/17   Rankin, Delphia Grates B, NP    Family History Family History  Problem Relation Age of Onset  . Cancer Father   . Depression Sister   . Depression Paternal Aunt   . Drug abuse Maternal Uncle        deceased  . Drug abuse Paternal Aunt   . Colon cancer Maternal Grandmother     Social History Social History   Tobacco Use  . Smoking status: Current Some Day Smoker    Packs/day: 0.50    Types: Cigarettes  . Smokeless tobacco: Never Used  Substance Use Topics  . Alcohol use: Not Currently    Alcohol/week: 0.0 standard drinks  . Drug use: Not on file    Comment: use $40 twice weekly     Allergies   Patient has no known allergies.   Review of Systems Review of Systems  All other systems reviewed and are negative.    Physical Exam Updated Vital Signs BP (!) 141/85 (BP Location: Left Arm)   Pulse (!) 106   Temp 98.2 F (36.8 C) (Oral)   Resp 18   SpO2 95%   Physical Exam Vitals signs and nursing note reviewed.  Constitutional:      General: She is not in acute distress.    Appearance: She is well-developed.     Comments: Pt laying in bed.  HENT:     Head: Normocephalic and atraumatic.  Eyes:     Conjunctiva/sclera: Conjunctivae normal.  Cardiovascular:     Rate and Rhythm: Normal rate.  Pulmonary:     Effort: Pulmonary effort is normal.  Abdominal:     Palpations:  Abdomen is soft.  Skin:    General: Skin is warm.  Neurological:     Mental Status: She is alert.  Psychiatric:        Behavior: Behavior normal.     Comments: Pt laying in bed during evaluation under a blanket, not making eye contact and keeping her eyes closes. Pt is calm on evaluation. She denies SI or HI when asked. Poor insight      ED Treatments / Results  Labs (all labs ordered are listed, but only abnormal results are displayed) Labs Reviewed  COMPREHENSIVE METABOLIC PANEL - Abnormal; Notable for the following components:      Result Value   CO2 20 (*)    Glucose, Bld 108 (*)    All other components within normal limits  SARS CORONAVIRUS 2 BY RT PCR (HOSPITAL ORDER, PERFORMED IN  HOSPITAL LAB)  ETHANOL  CBC WITH DIFFERENTIAL/PLATELET  RAPID URINE DRUG SCREEN, HOSP PERFORMED  PREGNANCY, URINE  I-STAT BETA HCG BLOOD, ED ([facility], [facility], [facility] ONLY)    EKG None  Radiology No results found.  Procedures Procedures (including critical care time)  Medications Ordered in ED Medications - No data to display   Initial Impression / Assessment and Plan / ED Course  I have reviewed the triage vital signs and the nursing notes.  Pertinent labs & imaging results that were available during my care of the patient were reviewed by me and considered in my medical decision making (see chart for details).        Patient brought in by [employer] under IVC per her mother for suicidal ideation, auditory and visual hallucinations and paranoia.  Patient is also endorsing methamphetamine use.  On evaluation, patient is not very conversant, laying in bed under the blanket and not making eye contact.  She is however calm  on my evaluation. CBC, CMP are unremarkable. Patient is medically cleared for TTS evaluation and disposition.    Final Clinical Impressions(s) / ED Diagnoses   Final diagnoses:  Bipolar affective disorder, manic, severe, with psychotic behavior (HCC)  Methamphetamine  use disorder, severe, dependence [facility])    ED Discharge Orders    None       Robinson, [place] N, PA-C 11/16/19 1917    Pricilla Loveless, MD 11/16/19 2237

## 2019-11-17 ENCOUNTER — Encounter (HOSPITAL_COMMUNITY): Payer: Self-pay

## 2019-11-17 ENCOUNTER — Other Ambulatory Visit: Payer: Self-pay

## 2019-11-17 DIAGNOSIS — F19959 Other psychoactive substance use, unspecified with psychoactive substance-induced psychotic disorder, unspecified: Secondary | ICD-10-CM

## 2019-11-17 DIAGNOSIS — F1520 Other stimulant dependence, uncomplicated: Secondary | ICD-10-CM

## 2019-11-17 MED ORDER — LORAZEPAM 2 MG/ML IJ SOLN
INTRAMUSCULAR | Status: AC
  Administered 2019-11-17: 1 mg via INTRAMUSCULAR
  Filled 2019-11-17: qty 1

## 2019-11-17 MED ORDER — ZIPRASIDONE MESYLATE 20 MG IM SOLR
10.0000 mg | INTRAMUSCULAR | Status: AC | PRN
  Administered 2019-11-17 – 2019-11-18 (×2): 10 mg via INTRAMUSCULAR

## 2019-11-17 MED ORDER — ZIPRASIDONE MESYLATE 20 MG IM SOLR
INTRAMUSCULAR | Status: AC
  Filled 2019-11-17: qty 20

## 2019-11-17 MED ORDER — OLANZAPINE 10 MG PO TBDP
10.0000 mg | ORAL_TABLET | Freq: Three times a day (TID) | ORAL | Status: DC | PRN
  Administered 2019-11-20: 10 mg via ORAL
  Filled 2019-11-17 (×2): qty 1

## 2019-11-17 MED ORDER — OLANZAPINE 5 MG PO TABS
5.0000 mg | ORAL_TABLET | Freq: Two times a day (BID) | ORAL | Status: DC
  Filled 2019-11-17 (×6): qty 1

## 2019-11-17 MED ORDER — LORAZEPAM 1 MG PO TABS
1.0000 mg | ORAL_TABLET | ORAL | Status: AC | PRN
  Administered 2019-11-18: 1 mg via ORAL
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
## 2019-11-17 NOTE — Tx Team (Signed)
Initial Treatment Plan 11/17/2019 1:42 AM Danielle Prince [id number]    PATIENT STRESSORS: Financial difficulties Marital or family conflict Medication change or noncompliance Substance abuse   PATIENT STRENGTHS: General fund of knowledge Physical Health   PATIENT IDENTIFIED PROBLEMS: Psychosis  SA-amphetamines   "nothing"                 DISCHARGE CRITERIA:  Improved stabilization in mood, thinking, and/or behavior Verbal commitment to aftercare and medication compliance  PRELIMINARY DISCHARGE PLAN: Attend aftercare/continuing care group Attend PHP/IOP Attend 12-step recovery group Outpatient therapy Participate in family therapy  PATIENT/FAMILY INVOLVEMENT: This treatment plan has been presented to and reviewed with the patient, Danielle Prince.  The patient and family have been given the opportunity to ask questions and make suggestions.  Providence Crosby, RN 11/17/2019, 1:42 AM
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
## 2019-11-17 NOTE — Progress Notes (Signed)
Patient ID: Danielle Prince, female   DOB: 04/02/1996, 23 y.o.   MRN: [id number]  [place] NOVEL CORONAVIRUS (COVID-19) DAILY CHECK-OFF SYMPTOMS - answer yes or no to each - every day NO YES  Have you had a fever in the past 24 hours?  . Fever (Temp > 37.80C / 100F) X   Have you had any of these symptoms in the past 24 hours? . New Cough .  Sore Throat  .  Shortness of Breath .  Difficulty Breathing .  Unexplained Body Aches   X   Have you had any one of these symptoms in the past 24 hours not related to allergies?   . Runny Nose .  Nasal Congestion .  Sneezing   X   If you have had runny nose, nasal congestion, sneezing in the past 24 hours, has it worsened?  X   EXPOSURES - check yes or no X   Have you traveled outside the state in the past 14 days?  X   Have you been in contact with someone with a confirmed diagnosis of COVID-19 or PUI in the past 14 days without wearing appropriate PPE?  X   Have you been living in the same home as a person with confirmed diagnosis of COVID-19 or a PUI (household contact)?    X   Have you been diagnosed with COVID-19?    X              What to do next: Answered NO to all: Answered YES to anything:   Proceed with unit schedule Follow the [facility] Inpatient Flowsheet.   
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
## 2019-11-17 NOTE — Plan of Care (Signed)
  Problem: Activity: Goal: Interest or engagement in activities will improve Outcome: Not Progressing   Problem: Coping: Goal: Ability to verbalize frustrations and anger appropriately will improve Outcome: Not Progressing Goal: Ability to demonstrate self-control will improve Outcome: Not Progressing
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
## 2019-11-17 NOTE — Progress Notes (Signed)
Patient is currently still asleep from earlier this afternoon after her IM injections for her disruptive behavior on the unit. Safety maintained on unit with 15 min checks

## 2019-11-17 NOTE — [facility] Group Notes (Signed)
[place] Group Notes: (Clinical Social Work)   11/17/2019      Type of Therapy:  Group Therapy   Participation Level:  Did Not Attend - was invited both individually by MHT and by overhead announcement, chose not to attend.   Selmer Dominion, LCSW 11/17/2019, 2:24 PM

## 2019-11-17 NOTE — Progress Notes (Signed)
Patient required restraint chair at 1655. She was in chair from 1700 to 1730 with a 1:1 observation. She was able to calm herself after 2 IM injections. She was released from the chair. She went to her room and went to sleep. She is now a unit restriction.  Patient became upset after speaking with MD. She became biligerent with staff; threatening posture. She was yelling/screaming/inconsolable. She is resting quietly at this time.

## 2019-11-18 DIAGNOSIS — F3110 Bipolar disorder, current episode manic without psychotic features, unspecified: Secondary | ICD-10-CM

## 2019-11-18 MED ORDER — ZIPRASIDONE MESYLATE 20 MG IM SOLR
10.0000 mg | INTRAMUSCULAR | Status: AC | PRN
  Administered 2019-11-18: 10 mg via INTRAMUSCULAR
  Filled 2019-11-18 (×2): qty 20

## 2019-11-18 MED ORDER — LORAZEPAM 2 MG/ML IJ SOLN
INTRAMUSCULAR | Status: AC
  Filled 2019-11-18: qty 1

## 2019-11-18 MED ORDER — TRAZODONE HCL 50 MG PO TABS
50.0000 mg | ORAL_TABLET | Freq: Every evening | ORAL | Status: DC | PRN
  Administered 2019-11-19 – 2019-11-20 (×2): 50 mg via ORAL
  Filled 2019-11-18 (×3): qty 1

## 2019-11-18 MED ORDER — LORAZEPAM 1 MG PO TABS: 1.0000 mg | ORAL_TABLET | ORAL | Status: DC | PRN

## 2019-11-18 MED ORDER — ZIPRASIDONE MESYLATE 20 MG IM SOLR: 20.0000 mg | INTRAMUSCULAR | Status: DC | PRN

## 2019-11-18 MED ORDER — HYDROXYZINE HCL 25 MG PO TABS: 25.0000 mg | ORAL_TABLET | Freq: Three times a day (TID) | ORAL | Status: DC | PRN

## 2019-11-18 NOTE — [facility] Counselor (Signed)
Clinical Social Work Note - Late entry for 11/17/2019  When CSW went to unit to do patient's Psychosocial Assessment, she was in the quiet room screaming with several staff members in attendance.  It was agreed that no attempt would be made at that time.  Selmer Dominion, LCSW 11/18/2019, 5:01 PM

## 2019-11-18 NOTE — Progress Notes (Signed)
   11/18/19 2245  Psych Admission Type (Psych Patients Only)  Admission Status Involuntary  Psychosocial Assessment  Patient Complaints Substance abuse;Worrying;Worthlessness  Eye Contact Brief  Facial Expression Flat;Sad  Affect Angry;Labile;Flat  Speech Pressured;Logical/coherent  Interaction Isolative  Motor Activity Other (Comment) (WNL)  Appearance/Hygiene Disheveled  Behavior Characteristics Unwilling to participate;Agressive verbally;Agitated;Anxious  Mood Depressed;Anxious;Labile;Preoccupied;Sad;Worthless, low self-esteem  Thought Process  Coherency Circumstantial  Content Blaming others  Delusions Paranoid  Perception WDL  Hallucination None reported or observed  Judgment Impaired  Confusion None  Danger to Self  Current suicidal ideation? Denies  Danger to Others  Danger to Others None reported or observed

## 2019-11-18 NOTE — Progress Notes (Addendum)
Patient ID: Danielle Prince, female   DOB: 1996-04-03, 23 y.o.   MRN: [id number]  [place] NOVEL CORONAVIRUS (COVID-19) DAILY CHECK-OFF SYMPTOMS - answer yes or no to each - every day NO YES  Have you had a fever in the past 24 hours?  . Fever (Temp > 37.80C / 100F) X   Have you had any of these symptoms in the past 24 hours? . New Cough .  Sore Throat  .  Shortness of Breath .  Difficulty Breathing .  Unexplained Body Aches   X   Have you had any one of these symptoms in the past 24 hours not related to allergies?   . Runny Nose .  Nasal Congestion .  Sneezing   X   If you have had runny nose, nasal congestion, sneezing in the past 24 hours, has it worsened?  X   EXPOSURES - check yes or no X   Have you traveled outside the state in the past 14 days?  X   Have you been in contact with someone with a confirmed diagnosis of COVID-19 or PUI in the past 14 days without wearing appropriate PPE?  X   Have you been living in the same home as a person with confirmed diagnosis of COVID-19 or a PUI (household contact)?    X   Have you been diagnosed with COVID-19?    X              What to do next: Answered NO to all: Answered YES to anything:   Proceed with unit schedule Follow the [facility] Inpatient Flowsheet.

## 2019-11-18 NOTE — Progress Notes (Signed)
Adult Psychoeducational Group Note  Date:  11/18/2019 Time:  11:09 PM  Group Topic/Focus:  Wrap-Up Group:   The focus of this group is to help patients review their daily goal of treatment and discuss progress on daily workbooks.  Participation Level:  Minimal  Participation Quality:  Drowsy, Inattentive, Monopolizing and Resistant  Affect:  Defensive, Depressed, Flat, Irritable, Labile, Lethargic, Not Congruent, Resistant and Tearful  Cognitive:  Disorganized, Confused, Delusional and Lacking  Insight: Lacking and Limited  Engagement in Group:  Lacking, Limited, None, Off Topic, Poor and Resistant  Modes of Intervention:  Discussion  Additional Comments:  Pt stated her goal for today was to focus on her treatment plan. Pt stated she felt she did not accomplished her goal today. Pt stated she was able to contact her mother today. Pt stated her relationship with her family needs to be improved. Pt stated she felt better about herself today. Pt rated her overall day a 3 out of 10. Pt stated her appetite was improved today. Pt stated her goal for tonight was to get some rest. PT stated she had no  Physical pain or problems today. Pt stated she was not hearing or seeing anything that was not there. Pt stated she had no thoughts of harming herself or others. Pt stated if anything change she would alert staff.  Candy Sledge 11/18/2019, 11:09 PM

## 2019-11-18 NOTE — [facility] Counselor (Signed)
Clinical Social Work Note  Psychosocial Assessment not attempted today because CSW was informed that patient required IM medication earlier and was now sleeping.  CSW team to continue attempts to complete PSA.  Selmer Dominion, LCSW 11/18/2019, 5:02 PM

## 2019-11-18 NOTE — Progress Notes (Signed)
Adult Psychoeducational Group Note  Date:  11/18/2019 Time:  1:49 AM  Group Topic/Focus:  Wrap-Up Group:   The focus of this group is to help patients review their daily goal of treatment and discuss progress on daily workbooks.  Participation Level:  Did Not Attend  Participation Quality:  Did Not Attend  Affect:  Did Not Attend  Cognitive:  Did Not Attend  Insight: None  Engagement in Group:  Did Not Attend  Modes of Intervention:  Did Not Attend  Additional Comments:  Pt did not attend evening wrap up group.  Candy Sledge 11/18/2019, 1:49 AM
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
## 2019-11-18 NOTE — Plan of Care (Signed)
  Problem: Activity: Goal: Interest or engagement in activities will improve Outcome: Not Progressing   Problem: Coping: Goal: Ability to verbalize frustrations and anger appropriately will improve Outcome: Not Progressing Goal: Ability to demonstrate self-control will improve Outcome: Not Progressing   D: Pt alert and oriented on the unit. Pt was isolated and asleep in her room most of the day. Pt awoke and used the phone to call her mother. Pt became agitated that her mother wasn't answering her phone call. Pt began yelling and crying saying, "My family doesn't love me!"   A: Education, support and encouragement provided, q15 minute safety checks remain in effect. Medications administered per MD orders.  R: No reactions/side effects to medicine noted. Pt denies any concerns at this time, and verbally contracts for safety. Pt ambulating on the unit with no issues. Pt remains safe on the unit.

## 2019-11-18 NOTE — [facility] Group Notes (Signed)
[place] [facility] LCSW Group Therapy Note  Date/Time:  11/18/2019  11:00AM-12:00PM  Type of Therapy and Topic:  Group Therapy:  Music and Mood  Participation Level:  Did Not Attend   Description of Group: In this process group, members listened to a variety of genres of music and identified that different types of music evoke different responses.  Patients were encouraged to identify music that was soothing for them and music that was energizing for them.  Patients discussed how this knowledge can help with wellness and recovery in various ways including managing depression and anxiety as well as encouraging healthy sleep habits.    Therapeutic Goals: 1. Patients will explore the impact of different varieties of music on mood 2. Patients will verbalize the thoughts they have when listening to different types of music 3. Patients will identify music that is soothing to them as well as music that is energizing to them 4. Patients will discuss how to use this knowledge to assist in maintaining wellness and recovery 5. Patients will explore the use of music as a coping skill  Summary of Patient Progress:  Invited, did not attend  Therapeutic Modalities: Solution Focused Brief Therapy Activity   Selmer Dominion, LCSW

## 2019-11-19 DIAGNOSIS — F15959 Other stimulant use, unspecified with stimulant-induced psychotic disorder, unspecified: Secondary | ICD-10-CM

## 2019-11-19 MED ORDER — GABAPENTIN 300 MG PO CAPS
300.0000 mg | ORAL_CAPSULE | Freq: Three times a day (TID) | ORAL | Status: DC
  Administered 2019-11-19 – 2019-11-22 (×7): 300 mg via ORAL
  Filled 2019-11-19 (×14): qty 1

## 2019-11-19 MED ORDER — ZIPRASIDONE MESYLATE 20 MG IM SOLR: 20.0000 mg | Freq: Four times a day (QID) | INTRAMUSCULAR | Status: DC | PRN

## 2019-11-19 MED ORDER — CARBAMAZEPINE 100 MG PO CHEW
100.0000 mg | CHEWABLE_TABLET | Freq: Three times a day (TID) | ORAL | Status: DC
  Administered 2019-11-19 – 2019-11-22 (×7): 100 mg via ORAL
  Filled 2019-11-19 (×13): qty 1

## 2019-11-19 MED ORDER — HYDROXYZINE HCL 50 MG PO TABS
50.0000 mg | ORAL_TABLET | Freq: Three times a day (TID) | ORAL | Status: DC | PRN
  Administered 2019-11-20: 50 mg via ORAL
  Filled 2019-11-19 (×2): qty 1

## 2019-11-19 MED ORDER — OLANZAPINE 10 MG PO TBDP
10.0000 mg | ORAL_TABLET | Freq: Two times a day (BID) | ORAL | Status: DC
  Administered 2019-11-19 – 2019-11-22 (×6): 10 mg via ORAL
  Filled 2019-11-19 (×10): qty 1

## 2019-11-19 NOTE — Progress Notes (Signed)
Recreation Therapy Notes  11.30.20 1251:  LRT met with patient to complete recreation therapy assessment.  LRT explained the assessment was to find out about pt coping skills and leisure interests.  Pt declined to complete assessment at this time.  LRT will meet with pt at a later time.    Victorino Sparrow, LRT/CTRS    Ria Comment, Shrika Milos A 11/19/2019 1:02 PM
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
## 2019-11-19 NOTE — B[facility] Counselor (Addendum)
CSW faxed over a referral to [facility] including recent progress notes.  [facility] Authorization # [id number] Authorization valid:  11/19/2019 to 11/25/2019  CSW following.     Update 1:55pm  CSW completed demographic screening over the phone with Baxter Flattery [phone]). Referral has been received and will be reviewed by a nurse.  Stephanie Acre, MSW, [place] Social Worker [facility]  [phone]
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
## 2019-11-19 NOTE — Progress Notes (Signed)
   11/19/19 2200  Psych Admission Type (Psych Patients Only)  Admission Status Involuntary  Psychosocial Assessment  Patient Complaints Substance abuse  Eye Contact Brief  Facial Expression Flat;Sad  Affect Angry;Labile;Flat  Speech Pressured;Logical/coherent  Interaction Isolative  Motor Activity Other (Comment) (WNL)  Appearance/Hygiene Disheveled  Behavior Characteristics Unwilling to participate  Mood Labile  Thought Process  Coherency Circumstantial  Content Blaming others  Delusions Paranoid  Perception WDL  Hallucination None reported or observed  Judgment Impaired  Confusion None  Danger to Self  Current suicidal ideation? Denies  Danger to Others  Danger to Others None reported or observed   Pt focused on D/C. Pt stated she wanted to talk to the doctor about being D/C . Pt continues to express that she does not belong here.

## 2019-11-19 NOTE — Progress Notes (Signed)
Recreation Therapy Notes  Date: 11.30.20 Time: 1000 Location: 500 Hall Dayroom  Group Topic: Wellness  Goal Area(s) Addresses:  Patient will define components of whole wellness. Patient will verbalize benefit of whole wellness.  Intervention:  Music  Activity: Exercise.  LRT led patients in Prince series of stretches to get them loosened up for group.  Each patient gets the chance to lead the group in an exercise of their choosing.  The group is to complete at least 30 minutes of exercise.  Patients could take breaks or get water as needed.  Education: Wellness, Discharge Planning.   Education Outcome: Acknowledges education/In group clarification offered/Needs additional education.   Clinical Observations/Feedback:  Pt did not attend group.    Danielle Prince, LRT/CTRS         Danielle Prince 11/19/2019 11:33 AM 

## 2019-11-19 NOTE — Tx Team (Signed)
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 acetaminophen (TYLENOL) tablet 650 mg  650 mg Oral Q6H PRN Suella Broad, FNP      . alum & mag hydroxide-simeth (MAALOX/MYLANTA) 200-200-20 MG/5ML suspension 30 mL  30 mL Oral Q4H PRN Starkes-Perry, Gayland Curry, FNP      . carbamazepine (TEGRETOL) chewable tablet 100 mg  100 mg Oral TID Johnn Hai, MD      . gabapentin (NEURONTIN) capsule 300 mg  300 mg Oral TID Johnn Hai, MD      . hydrOXYzine (ATARAX/VISTARIL) tablet 50 mg  50 mg Oral TID PRN Johnn Hai, MD      . OLANZapine zydis (ZYPREXA) disintegrating tablet 10 mg  10 mg Oral Q8H PRN Cobos, Myer Peer, MD      . OLANZapine zydis (ZYPREXA) disintegrating tablet 10 mg  10 mg Oral BID Johnn Hai, MD      . traZODone (DESYREL) tablet 50 mg  50 mg Oral QHS PRN Connye Burkitt, NP      . ziprasidone (GEODON) injection 20 mg  20 mg Intramuscular Q6H PRN Johnn Hai, MD       PTA Medications: Facility-Administered Medications Prior to Admission  Medication Dose Route Frequency Provider Last Rate Last Dose  . [DISCONTINUED] medroxyPROGESTERone (DEPO-PROVERA) injection 150 mg  150 mg Intramuscular Q90 days Shelly Bombard, MD   150 mg at 04/26/19 1122   Medications Prior to Admission  Medication Sig Dispense Refill Last Dose  . gabapentin (NEURONTIN) 300 MG capsule Take 1 capsule (300 mg total) by mouth 3 (three) times daily. (Patient not taking: Reported on 11/17/2019) 90 capsule 0 Not Taking at Unknown time  . lamoTRIgine (LAMICTAL) 200 MG tablet Take 200 mg by mouth daily.         Patient Stressors: Financial difficulties Marital or family conflict Medication change or noncompliance Substance abuse  Patient Strengths: General fund of knowledge Physical Health  Treatment Modalities: Medication Management, Group therapy, Case management,  1 to 1 session with clinician, Psychoeducation, Recreational therapy.   Physician Treatment Plan for Primary Diagnosis: <principal problem not specified> Long Term Goal(s): Improvement in symptoms so as ready for discharge Improvement in symptoms so as ready for discharge   Short Term Goals: Ability to identify changes in lifestyle to reduce recurrence of condition will improve Ability to verbalize feelings will improve Ability to disclose and discuss suicidal ideas Ability to demonstrate self-control will improve Ability to identify and develop effective coping behaviors will improve Ability to maintain clinical measurements within normal limits will improve Ability to identify changes in lifestyle to reduce recurrence of condition will improve Ability to verbalize feelings will improve Ability to disclose and discuss suicidal ideas Ability to demonstrate self-control will improve Ability to identify and develop effective coping behaviors will improve Ability to maintain clinical measurements within normal limits will improve  Medication Management: Evaluate patient's response, side effects, and tolerance of medication regimen.  Therapeutic Interventions: 1 to 1 sessions, Unit Group sessions and Medication administration.  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[place])  Long Term Goal(s): Improvement in symptoms so as ready for discharge Improvement in symptoms so as ready for discharge   Short Term Goals: Ability to identify changes in lifestyle to reduce recurrence of  condition will improve Ability to verbalize feelings will improve Ability to disclose and discuss suicidal ideas Ability to demonstrate self-control will improve Ability to identify and develop effective coping behaviors will improve Ability to maintain clinical measurements within normal limits will improve Ability to identify changes in lifestyle to reduce recurrence of condition will improve Ability to verbalize feelings will improve Ability to disclose and discuss suicidal ideas Ability to demonstrate self-control will improve Ability to identify and develop effective coping behaviors will improve Ability to maintain clinical measurements within normal limits will improve     Medication Management: Evaluate patient's response, side effects, and tolerance of medication regimen.  Therapeutic Interventions: 1 to 1 sessions, Unit Group sessions and Medication administration.  Evaluation of Outcomes: Not Met   RN Treatment Plan for Primary Diagnosis: <principal problem not specified> Long Term Goal(s): Knowledge of disease and therapeutic regimen to maintain health will improve  Short Term Goals: Ability to verbalize frustration and anger appropriately will improve, Ability to demonstrate self-control, Ability to disclose and discuss suicidal ideas, Ability to identify and develop effective coping behaviors will improve and Compliance with prescribed medications will improve  Medication Management: RN will administer medications as ordered by provider, will assess and evaluate patient's response and provide education to patient for prescribed medication. RN will report any adverse and/or side effects to prescribing provider.  Therapeutic Interventions: 1 on 1 counseling sessions, Psychoeducation, Medication administration, Evaluate responses to treatment, Monitor vital signs and CBGs as ordered, Perform/monitor CIWA, COWS, AIMS and Fall Risk screenings as ordered, Perform wound care  treatments as ordered.  Evaluation of Outcomes: Not Met   LCSW Treatment Plan for Primary Diagnosis: <principal problem not specified> Long Term Goal(s): Safe transition to appropriate next level of care at discharge, Engage patient in therapeutic group addressing interpersonal concerns.  Short Term Goals: Engage patient in aftercare planning with referrals and resources, Increase ability to appropriately verbalize feelings, Increase emotional regulation, Facilitate patient progression through stages of change regarding substance use diagnoses and concerns, Identify triggers associated with mental health/substance abuse issues and Increase skills for wellness and recovery  Therapeutic Interventions: Assess for all discharge needs, 1 to 1 time with Social worker, Explore available resources and support systems, Assess for adequacy in community support network, Educate family and significant other(s) on suicide prevention, Complete Psychosocial Assessment, Interpersonal group therapy.  Evaluation of Outcomes: Not Met   Progress in Treatment: Attending groups: Yes. Participating in groups: Yes. Taking medication as prescribed: No. Toleration medication: No. Family/Significant other contact made: No, will contact:  supports if consents are granted. Patient understands diagnosis: No. Discussing patient identified problems/goals with staff: No. Medical problems stabilized or resolved: No. Denies suicidal/homicidal ideation: No. Issues/concerns per patient self-inventory: Yes.  New problem(s) identified: Yes, Describe:  medication non-compliance, family stressors and conflict  New Short Term/Long Term Goal(s): detox, medication management for mood stabilization; elimination of SI thoughts; development of comprehensive mental wellness/sobriety plan.  Patient Goals: Not able to specify a goal  Discharge Plan or Barriers: [facility] referral faxed on 11/30  Reason for  Continuation of Hospitalization: Aggression Delusions  Hallucinations Medication stabilization  Estimated Length of Stay: 5-7 days   Attendees: Patient: Danielle Prince 11/19/2019 9:41 AM  Physician: Dr.Farah 11/19/2019 9:41 AM  Nursing: Benjamine Mola, RN 11/19/2019 9:41 AM  RN Care Manager: 11/19/2019 9:41 AM  Social Worker: Stephanie Acre, [place] 11/19/2019 9:41 AM  Recreational Therapist:  11/19/2019 9:41 AM  Other:  11/19/2019 9:41 AM  Other:  11/19/2019 9:41 AM  Other: 11/19/2019 9:41 AM    Scribe for Treatment Team: Joellen Jersey, [place] 11/19/2019 9:41 AM

## 2019-11-20 MED ORDER — NICOTINE 21 MG/24HR TD PT24
21.0000 mg | MEDICATED_PATCH | Freq: Every day | TRANSDERMAL | Status: DC
  Administered 2019-11-20 – 2019-11-22 (×2): 21 mg via TRANSDERMAL
  Filled 2019-11-20 (×6): qty 1

## 2019-11-20 NOTE — Progress Notes (Signed)
Pt up to the nursing station requesting something to help her calm down. Pt encouraged to relax and let the medication she received earlier take affect.

## 2019-11-20 NOTE — Progress Notes (Signed)
Recreation Therapy Notes  INPATIENT RECREATION THERAPY ASSESSMENT  Patient Details Name: Danielle Prince MRN: [id number] DOB: Dec 18, 1996 Today's Date: 11/20/2019       Information Obtained From: Chart Review  Able to Participate in Assessment/Interview: (LRT went to complete assessment with patient.  Patient has been in her room all day asleep.)  Reason for Admission (Per Patient): Suicidal Ideation, Other (Comments)(Psychosis)  Patient Stressors: Family, Other (Comment)(Living situation)  Coping Skills:   Aggression, Substance Abuse  Leisure Interests (2+):  Social - Friends, Art - Draw, Individual - Other (Comment)(Baking; Exercise)  [place] of Residence:  [place]  Patient Main Form of Transportation:    Patient Strengths:  Exercise; Baking  Patient Identified Areas of Improvement:  Managing mental illness  Staff Intervention Plan: Group Attendance, Collaborate with Interdisciplinary Treatment Team  Consent to Intern Participation: N/A    Victorino Sparrow, LRT/CTRS  Victorino Sparrow A 11/20/2019, 2:27 PM

## 2019-11-20 NOTE — Progress Notes (Signed)
   11/20/19 2300  Psych Admission Type (Psych Patients Only)  Admission Status Involuntary  Psychosocial Assessment  Patient Complaints Agitation  Eye Contact Brief  Facial Expression Flat;Sad  Affect Angry;Labile;Flat  Speech Pressured;Logical/coherent  Interaction Isolative  Motor Activity Other (Comment) (WNL)  Appearance/Hygiene Disheveled  Behavior Characteristics Unwilling to participate  Mood Labile  Thought Process  Coherency Circumstantial  Content Blaming others  Delusions Paranoid  Perception WDL  Hallucination None reported or observed  Judgment Impaired  Confusion None  Danger to Self  Current suicidal ideation? Denies  Danger to Others  Danger to Others None reported or observed   Pt kept to herself much of the evening. Pt agitated requesting something to help her calm down and to help her sleep. Pt given Trazodone, Vistaril and Zyprexa per [facility]

## 2019-11-20 NOTE — Progress Notes (Signed)
Recreation Therapy Notes  Date: 12.1.20 Time: 1000 Location: 500 Hall Dayroom  Group Topic: Coping Skills  Goal Area(s) Addresses:  Patient will identify positive coping skills. Patient will identify benefit of using coping skills post d/c.  Intervention: Worksheet, pencils  Activity: Mind Map.  LRT and patients filled in the first 8 boxes of the mind map with being honest, anxiety, loss of job, having kids, school, work, depression and losing someone.  Patients were then given time to come up with at least 3 coping skills for each situation identified.   Education: Radiographer, therapeutic, Dentist.   Education Outcome: Acknowledges understanding/In group clarification offered/Needs additional education.   Clinical Observations/Feedback: Pt did not attend group session.    Victorino Sparrow, LRT/CTRS         Victorino Sparrow A 11/20/2019 11:41 AM

## 2019-11-20 NOTE — Progress Notes (Signed)
Adult Psychoeducational Group Note  Date:  11/20/2019 Time:  1:25 AM  Group Topic/Focus:  Wrap-Up Group:   The focus of this group is to help patients review their daily goal of treatment and discuss progress on daily workbooks.  Participation Level:  Did Not Attend  Participation Quality:  Did Not Attend  Affect:  Did Not Attend  Cognitive:  Did Not Attend  Insight: None  Engagement in Group:  Did Not Attend  Modes of Intervention:  Did Not Attend  Additional Comments:  Pt did not attend evening wrap up group tonight.  Candy Sledge 11/20/2019, 1:25 AM

## 2019-11-20 NOTE — Progress Notes (Signed)
Patient denies SI, HI and AVH this shift.  Patient has been compliant with medications and attended groups this shift.  Patient has had no incidents of behavioral dyscontrol.   Assess patient for safety, offer medications as prescribed, engage patient in 1:1 staff talks.   Patient able to contract for safety.  Continue to monitor as prescribed.  

## 2019-11-20 NOTE — Progress Notes (Signed)
Pt continued to express that she did not talk to the doctor, but per his not pt yelled at the doctor and would not talk to him, so pt was informed that she needed to talk to the doctor, but pt continues to blame everything but her own responsibility for things going on in her life.

## 2019-11-20 NOTE — Progress Notes (Signed)
[facility] MD Progress Note  11/20/2019 12:38 PM Danielle Prince  MRN:  [id number] Subjective:   Patient slightly less irritable remains very needy requests multiple interviews and interactions but has no new data to share is very focused on discharge continues to insist she is "not paranoid" that she has indeed suffered the hacking and so forth.  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 ADHD (attention deficit hyperactivity disorder)   . ADHD (attention deficit hyperactivity disorder)   . Anorexia nervosa with bulimia   . Anxiety   . Aspirin overdose 02/2012  . Depression   . Dysrhythmia   . Eating disorder   . Electrolyte disturbance 2010, 2012   hypokalemia, hypochloremia.  . Urinary tract infection   . Varicella   . Vision abnormalities     Past Surgical History:  Procedure Laterality Date  . COLONOSCOPY WITH ESOPHAGOGASTRODUODENOSCOPY (EGD) N/A 10/28/2013   Procedure: COLONOSCOPY WITH ESOPHAGOGASTRODUODENOSCOPY (EGD);  Surgeon: Beverley Fiedler, MD;  Location: [facility] ENDOSCOPY;  Service: Endoscopy;  Laterality: N/A;  . GUM SURGERY    . INDUCED ABORTION     Family History:  Family History  Problem Relation Age of Onset  . Cancer Father   . Depression Sister   . Depression Paternal Aunt   . Drug abuse Maternal Uncle        deceased  . Drug abuse  Paternal Aunt   . Colon cancer Maternal Grandmother    Family Psychiatric  History: No new data Social History:  Social History   Substance and Sexual Activity  Alcohol Use Not Currently  . Alcohol/week: 0.0 standard drinks     Social History   Substance and Sexual Activity  Drug Use Not on file   Comment: use $40 twice weekly    Social History   Socioeconomic History  . Marital status: Single    Spouse name: Not on file  . Number of children: Not on file  . Years of education: Not on file  . Highest education level: Not on file  Occupational History  . Occupation: Pharmacist, hospital: [employer]  Social Needs  . Financial resource strain: Not on file  . Food insecurity    Worry: Not on file    Inability: Not on file  . Transportation needs    Medical: Not on file    Non-medical: Not on file  Tobacco Use  . Smoking status: Current Some Day Smoker    Packs/day: 0.50    Types: Cigarettes  . Smokeless tobacco: Never Used  Substance and Sexual Activity  . Alcohol use: Not Currently    Alcohol/week: 0.0 standard drinks  . Drug use: Not on file    Comment: use $40 twice weekly  . Sexual activity: Yes    Partners: Male    Birth control/protection: Injection  Lifestyle  . Physical activity    Days per week: Not on  file    Minutes per session: Not on file  . Stress: Not on file  Relationships  . Social Musicianconnections    Talks on phone: Not on file    Gets together: Not on file    Attends religious service: Not on file    Active member of club or organization: Not on file    Attends meetings of clubs or organizations: Not on file    Relationship status: Not on file  Other Topics Concern  . Not on file  Social History Narrative  . Not on file   Sleep: Poor in quality by report but lengthy and off  Appetite:  Fair  Current Medications: Current Facility-Administered Medications  Medication Dose Route Frequency Provider Last Rate Last Dose  . acetaminophen (TYLENOL)  tablet 650 mg  650 mg Oral Q6H PRN Maryagnes AmosStarkes-Perry, Takia S, FNP      . alum & mag hydroxide-simeth (MAALOX/MYLANTA) 200-200-20 MG/5ML suspension 30 mL  30 mL Oral Q4H PRN Starkes-Perry, Juel Burrowakia S, FNP      . carbamazepine (TEGRETOL) chewable tablet 100 mg  100 mg Oral TID Malvin JohnsFarah, Trysten Bernard, MD   100 mg at 11/20/19 0740  . gabapentin (NEURONTIN) capsule 300 mg  300 mg Oral TID Malvin JohnsFarah, Mihaela Fajardo, MD   300 mg at 11/20/19 0741  . hydrOXYzine (ATARAX/VISTARIL) tablet 50 mg  50 mg Oral TID PRN Malvin JohnsFarah, Carlean Crowl, MD      . OLANZapine zydis (ZYPREXA) disintegrating tablet 10 mg  10 mg Oral Q8H PRN Cobos, Rockey SituFernando A, MD      . OLANZapine zydis (ZYPREXA) disintegrating tablet 10 mg  10 mg Oral BID Malvin JohnsFarah, Wentworth Edelen, MD   10 mg at 11/20/19 0741  . traZODone (DESYREL) tablet 50 mg  50 mg Oral QHS PRN Aldean BakerSykes, Janet E, NP   50 mg at 11/19/19 2125  . ziprasidone (GEODON) injection 20 mg  20 mg Intramuscular Q6H PRN Malvin JohnsFarah, Emmerich Cryer, MD        Lab Results: No results found for this or any previous visit (from the past 48 hour(s)).  Blood Alcohol level:  Lab Results  Component Value Date   ETH <10 11/16/2019   ETH <10 02/28/2018    Metabolic Disorder Labs: No results found for: HGBA1C, MPG No results found for: PROLACTIN No results found for: CHOL, TRIG, HDL, CHOLHDL, VLDL, LDLCALC  Physical Findings: AIMS: Facial and Oral Movements Muscles of Facial Expression: None, normal Lips and Perioral Area: None, normal Jaw: None, normal Tongue: None, normal,Extremity Movements Upper (arms, wrists, hands, fingers): None, normal Lower (legs, knees, ankles, toes): None, normal, Trunk Movements Neck, shoulders, hips: None, normal, Overall Severity Severity of abnormal movements (highest score from questions above): None, normal Incapacitation due to abnormal movements: None, normal Patient's awareness of abnormal movements (rate only patient's report): No Awareness, Dental Status Current problems with teeth and/or dentures?: No Does  patient usually wear dentures?: No  CIWA:  CIWA-Ar Total: 7 COWS:  COWS Total Score: 7  Musculoskeletal: Strength & Muscle Tone: within normal limits Gait & Station: normal Patient leans: N/A  Psychiatric Specialty Exam: Physical Exam  ROS  There were no vitals taken for this visit.There is no height or weight on file to calculate BMI.  General Appearance: Casual  Eye Contact:  Minimal  Speech:  Normal Rate  Volume:  Decreased  Mood:  Irritable  Affect:  Constricted  Thought Process:  Linear and Descriptions of Associations: Circumstantial  Orientation:  Full (Time, Place, and Person)  Thought Content:  Illogical,  Delusions and Paranoid Ideation  Suicidal Thoughts:  No  Homicidal Thoughts:  No  Memory:  Recent;   Fair Remote;   Fair  Judgement:  Impaired  Insight:  Shallow  Psychomotor Activity:  Normal  Concentration:  Concentration: Fair and Attention Span: Fair  Recall:  [employer] of Knowledge:  Fair  Language:  Fair  Akathisia:  Negative  Handed:  Right  AIMS (if indicated):     Assets:  Communication Skills Desire for Improvement  ADL's:  Intact  Cognition:  WNL  Sleep:  Number of Hours: 6.75     Treatment Plan Summary: Daily contact with patient to assess and evaluate symptoms and progress in treatment and Medication management  Continue antipsychotic management continue reality based therapy continue to monitor for withdrawal no change in precautions may go in 1 to 2 days communicate with family regarding discharge planning  Jadd Gasior, MD 11/20/2019, 12:38 PM

## 2019-11-21 MED ORDER — ARIPIPRAZOLE ER 400 MG IM SRER
400.0000 mg | INTRAMUSCULAR | Status: DC
  Administered 2019-11-21: 400 mg via INTRAMUSCULAR

## 2019-11-21 NOTE — Progress Notes (Signed)
[facility]  11/21/2019 7:42 AM Danielle Prince  MRN:  [id number] Subjective:   Patient is interviewed she is less irritable today again very focused on discharge planning she no longer has paranoia or is at least covering it up when I probe for previously expressed delusional material she states "I am not thinking about that now" She denies hallucinations denies cravings for drugs  Spoke with her mother who reports that Imagine was simply not take medications at home and request that we use a long-acting injectable once I introduced that possibility.  She has tolerated aripiprazole before in the oral form without sensitivity.  Further the mother needs a day to clean out the patient's room further as it is a bigger job than she expected, needles everywhere, unsanitary conditions so forth  Principal Problem: <principal problem not specified> Diagnosis: Active Problems:   Bipolar I disorder with mania (HCC)   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HCC)  Total Time spent with patient: 20 minutes  Past Psychiatric History: Prior similar episodes  Past Medical History:  Past Medical History:  Diagnosis Date  . ADHD (attention deficit hyperactivity disorder)   . ADHD (attention deficit hyperactivity disorder)   . Anorexia nervosa with bulimia   . Anxiety   . Aspirin overdose 02/2012  . Depression   . Dysrhythmia   . Eating disorder   . Electrolyte disturbance 2010, 2012   hypokalemia, hypochloremia.  . Urinary tract infection   . Varicella   . Vision abnormalities     Past Surgical History:  Procedure Laterality Date  . COLONOSCOPY WITH ESOPHAGOGASTRODUODENOSCOPY (EGD) N/A 10/28/2013   Procedure: COLONOSCOPY WITH ESOPHAGOGASTRODUODENOSCOPY (EGD);  Surgeon: Beverley FiedlerJay M Pyrtle, MD;  [facility];  Service: Endoscopy;  Laterality: N/A;  . GUM SURGERY    . INDUCED ABORTION     Family History:  Family History  Problem Relation Age of Onset  . Cancer Father   .  Depression Sister   . Depression Paternal Aunt   . Drug abuse Maternal Uncle        deceased  . Drug abuse Paternal Aunt   . Colon cancer Maternal Grandmother    Family Psychiatric  History: No new data shared Social History:  Social History   Substance and Sexual Activity  Alcohol Use Not Currently  . Alcohol/week: 0.0 standard drinks     Social History   Substance and Sexual Activity  Drug Use Not on file   Comment: use $40 twice weekly    Social History   Socioeconomic History  . Marital status: Single    Spouse name: Not on file  . Number of children: Not on file  . Years of education: Not on file  . Highest education level: Not on file  Occupational History  . Occupation: Pharmacist, [employer]    Employer: [employer]  Social Needs  . Financial resource strain: Not on file  . Food insecurity    Worry: Not on file    Inability: Not on file  . Transportation needs    Medical: Not on file    Non-medical: Not on file  Tobacco Use  . Smoking status: Current Some Day Smoker    Packs/day: 0.50    Types: Cigarettes  . Smokeless tobacco: Never Used  Substance and Sexual Activity  . Alcohol use: Not Currently    Alcohol/week: 0.0 standard drinks  . Drug use: Not on file    Comment: use $40 twice weekly  . Sexual activity: Yes    Partners: Male  Birth control/protection: Injection  Lifestyle  . Physical activity    Days per week: Not on file    Minutes per session: Not on file  . Stress: Not on file  Relationships  . Social Herbalist on phone: Not on file    Gets together: Not on file    Attends religious service: Not on file    Active member of club or organization: Not on file    Attends meetings of clubs or organizations: Not on file    Relationship status: Not on file  Other Topics Concern  . Not on file  Social History Narrative  . Not on file   Additional Social History:                         Sleep: Fair  Appetite:  Fair  Current  Medications: Current Facility-Administered Medications  Medication Dose Route Frequency Provider Last Rate Last Dose  . acetaminophen (TYLENOL) tablet 650 mg  650 mg Oral Q6H PRN Suella Broad, FNP      . alum & mag hydroxide-simeth (MAALOX/MYLANTA) 200-200-20 MG/5ML suspension 30 mL  30 mL Oral Q4H PRN Starkes-Perry, Gayland Curry, FNP      . carbamazepine (TEGRETOL) chewable tablet 100 mg  100 mg Oral TID Johnn Hai, MD   100 mg at 11/20/19 1834  . gabapentin (NEURONTIN) capsule 300 mg  300 mg Oral TID Johnn Hai, MD   300 mg at 11/20/19 1836  . hydrOXYzine (ATARAX/VISTARIL) tablet 50 mg  50 mg Oral TID PRN Johnn Hai, MD   50 mg at 11/20/19 2237  . nicotine (NICODERM CQ - dosed in mg/24 hours) patch 21 mg  21 mg Transdermal Daily Johnn Hai, MD   21 mg at 11/20/19 1836  . OLANZapine zydis (ZYPREXA) disintegrating tablet 10 mg  10 mg Oral Q8H PRN Cobos, Myer Peer, MD   10 mg at 11/20/19 2237  . OLANZapine zydis (ZYPREXA) disintegrating tablet 10 mg  10 mg Oral BID Johnn Hai, MD   10 mg at 11/20/19 1835  . traZODone (DESYREL) tablet 50 mg  50 mg Oral QHS PRN Connye Burkitt, NP   50 mg at 11/20/19 2237  . ziprasidone (GEODON) injection 20 mg  20 mg Intramuscular Q6H PRN Johnn Hai, MD        Lab Results: No results found for this or any previous visit (from the past 48 hour(s)).  Blood Alcohol level:  Lab Results  Component Value Date   ETH <10 11/16/2019   ETH <10 88/41/6606    Metabolic Disorder Labs: No results found for: HGBA1C, MPG No results found for: PROLACTIN No results found for: CHOL, TRIG, HDL, CHOLHDL, VLDL, LDLCALC  Physical Findings: AIMS: Facial and Oral Movements Muscles of Facial Expression: None, normal Lips and Perioral Area: None, normal Jaw: None, normal Tongue: None, normal,Extremity Movements Upper (arms, wrists, hands, fingers): None, normal Lower (legs, knees, ankles, toes): None, normal, Trunk Movements Neck, shoulders, hips: None,  normal, Overall Severity Severity of abnormal movements (highest score from questions above): None, normal Incapacitation due to abnormal movements: None, normal Patient's awareness of abnormal movements (rate only patient's report): No Awareness, Dental Status Current problems with teeth and/or dentures?: No Does patient usually wear dentures?: No  CIWA:  CIWA-Ar Total: 7 COWS:  COWS Total Score: 7  Musculoskeletal: Strength & Muscle Tone: within normal limits Gait & Station: normal Patient leans: N/A  Psychiatric Specialty Exam: Physical Exam  ROS  There were no vitals taken for this visit.There is no height or weight on file to calculate BMI.  General Appearance: Casual  Eye Contact:  Good  Speech:  Clear and Coherent  Volume:  Decreased  Mood:  Irritable  Affect:  Blunt  Thought Process:  Goal Directed and Descriptions of Associations: Circumstantial  Orientation:  Full (Time, Place, and Person)  Thought Content:  Tangential  Suicidal Thoughts:  No  Homicidal Thoughts:  No  Memory:  Immediate;   Fair Recent;   Fair Remote;   Fair  Judgement:  Fair  Insight:  Fair  Psychomotor Activity:  Normal  Concentration:  Concentration: Fair  Recall:  [employer] of Knowledge:  Fair  Language:  Fair  Akathisia:  Negative  Handed:  Right  AIMS (if indicated):     Assets:  Physical Health Resilience Social Support  ADL's:  Intact  Cognition:  WNL  Sleep:  Number of Hours: 9.5     Treatment Plan Summary: Daily contact with patient to assess and evaluate symptoms and progress in treatment and Medication management  Continue carbamazepine and gabapentin for cravings and mood stabilization, administer long-acting injectable aripiprazole, continue current precautions plan on discharge tomorrow  Malvin Johns, MD 11/21/2019, 7:42 AM

## 2019-11-21 NOTE — Progress Notes (Signed)
Recreation Therapy Notes  Date: 12.2.20 Time: 1000 Location: 500 Hall Dayroom  Group Topic: Communication  Goal Area(s) Addresses:  Patient will effectively communicate with peers in group.  Patient will verbalize benefit of healthy communication. Patient will verbalize positive effect of healthy communication on post d/c goals.  Patient will identify communication techniques that made activity effective for group.   Intervention: Drawings, Pencils, Blank Paper   Activity: Geometrical Drawings.  One person comes up a describes a geometrical drawing to the rest of the group.  The presenter will get as detailed as needed to describe the picture.  The remaining members of the group can only ask the presenter to repeat themselves.  They can not ask any specific questions.  When the presenter is finished, the group will compare their pictures to the original.  Education: Communication, Discharge Planning  Education Outcome: Acknowledges understanding/In group clarification offered/Needs additional education.   Clinical Observations/Feedback:  Pt did not attend group.    Victorino Sparrow, LRT/CTRS         Ria Comment, Briany Aye A 11/21/2019 11:30 AM

## 2019-11-21 NOTE — Progress Notes (Signed)
Patient denies SI, HI and AVH this shift.  Patient has been compliant with medications and attended groups this shift.  Patient has had no incidents of behavioral dyscontrol.   Assess patient for safety, offer medications as prescribed, engage patient in 1:1 staff talks.   Patient able to contract for safety.  Continue to monitor as prescribed.  

## 2019-11-21 NOTE — Progress Notes (Signed)
   11/21/19 2200  Psych Admission Type (Psych Patients Only)  Admission Status Involuntary  Psychosocial Assessment  Patient Complaints Anxiety;Agitation  Eye Contact Brief  Facial Expression Flat;Sad  Affect Angry;Labile;Flat  Speech Pressured;Logical/coherent  Interaction Isolative  Motor Activity Other (Comment) (WNL)  Appearance/Hygiene Disheveled  Behavior Characteristics Unwilling to participate  Mood Labile  Thought Process  Coherency Circumstantial  Content Blaming others  Delusions Paranoid  Perception WDL  Hallucination None reported or observed  Judgment Impaired  Confusion None  Danger to Self  Current suicidal ideation? Denies  Danger to Others  Danger to Others None reported or observed   Pt kept to herself in her room this evening.

## 2019-11-21 NOTE — Progress Notes (Signed)
Adult Psychoeducational Group Note  Date:  11/21/2019 Time:  2:10 AM  Group Topic/Focus:  Wrap-Up Group:   The focus of this group is to help patients review their daily goal of treatment and discuss progress on daily workbooks.  Participation Level:  Did Not Attend  Participation Quality:  Did Not Attend  Affect:  Did Not Attend  Cognitive:  Did Not Attend  Insight: None  Engagement in Group:  Did Not Attend  Modes of Intervention:  Did Not Attend  Additional Comments:  Pt did not attend evening wrap up group tonight.  Candy Sledge 11/21/2019, 2:10 AM

## 2019-11-22 MED ORDER — CARBAMAZEPINE 100 MG PO CHEW: 100.0000 mg | CHEWABLE_TABLET | Freq: Three times a day (TID) | ORAL | 2 refills | Status: DC

## 2019-11-22 MED ORDER — ARIPIPRAZOLE ER 400 MG IM SRER: 400.0000 mg | INTRAMUSCULAR | 11 refills | Status: DC

## 2019-11-22 MED ORDER — OLANZAPINE 20 MG PO TBDP: ORAL_TABLET | ORAL | 1 refills | Status: DC

## 2019-11-22 MED ORDER — GABAPENTIN 300 MG PO CAPS: 300.0000 mg | ORAL_CAPSULE | Freq: Three times a day (TID) | ORAL | 2 refills | Status: AC

## 2019-11-22 NOTE — [facility] Suicide Risk Assessment (Signed)
[facility] INPATIENT:  Family/Significant Other Suicide Prevention Education  Suicide Prevention Education:  Patient Refusal for Family/Significant Other Suicide Prevention Education: The patient Danielle Prince has refused to provide written consent for family/significant other to be provided Family/Significant Other Suicide Prevention Education during admission and/or prior to discharge.  Physician notified.  Joellen Jersey 11/22/2019, 9:46 AM
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
## 2019-11-22 NOTE — Progress Notes (Signed)
  [facility] Adult Case Management Discharge Plan :  Will you be returning to the same living situation after discharge:  Yes,  home. At discharge, do you have transportation home?: Yes,  mother is picking up. Patient declined transportation assistance. Do you have the ability to pay for your medications: Yes,  BCBS.  Release of information consent forms completed and in the chart;  Patient's signature needed at discharge.  Patient to Follow up at: Follow-up Information    Patient declines follow up Follow up.   Contact information: Patient declines all outpatient follow up.           Next level of care provider has access to [facility] and Suicide Prevention discussed: Yes,  with patient. Patient declined consents.  Have you used any form of tobacco in the last 30 days? (Cigarettes, Smokeless Tobacco, Cigars, and/or Pipes): Yes  Has patient been referred to the Quitline?: Patient refused referral  Patient has been referred for addiction treatment: Pt. refused referral  Joellen Jersey, [place] 11/22/2019, 9:38 AM

## 2019-11-22 NOTE — Progress Notes (Signed)
Patient ID: Danielle Prince, female   DOB: Nov 29, 1996, 23 y.o.   MRN: [id number] Patient discharged to home/self care in the presence of her mother.  Patient was irritable and agitated upon discharge stating her mother was taking too long to come and get her.   Patient denies SI, HI and AVH upon discharge.  Patient acknowledged understanding of all discharge instructions and receipt of personal belongings.

## 2019-11-22 NOTE — [facility] Suicide Risk Assessment (Signed)
[facility] Discharge Suicide Risk Assessment   Principal Problem: Methamphetamine induced psychosis/that has converted to a more permanent form of psychosis/substance-induced mood disorder bipolar type/borderline personality disorder/polysubstance dependency and abuse Discharge Diagnoses: Active Problems:   Bipolar I disorder with mania ([place])   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[place])   Total Time spent with patient: 45 minutes  Musculoskeletal: Strength & Muscle Tone: within normal limits Gait & Station: normal Psychiatric Specialty Exam: Physical Exam  ROS  Blood pressure 92/70, pulse (!) 128.There is no height or weight on file to calculate BMI.  General Appearance: Disheveled  Eye Contact:  Good  Speech:  Clear and Coherent  Volume:  Normal  Mood:  Euthymic  Affect:  Constricted  Thought Process:  Goal Directed and Descriptions of Associations: Tangential  Orientation:  Full (Time, Place, and Person)  Thought Content:  Logical  Suicidal Thoughts:  No  Homicidal Thoughts:  No  Memory:  Immediate;   Good Recent;   Good Remote;   Good  Judgement:  Fair  Insight:  Fair  Psychomotor Activity:  Normal  Concentration:  Concentration: Fair and Attention Span: Fair  Recall:  [employer] of Knowledge:  Fair  Language:  Fair  Akathisia:  Negative  Handed:  Right  AIMS (if indicated):     Assets:  Physical Health Resilience Social Support  ADL's:  Intact  Cognition:  WNL  Sleep:  Number of Hours: 9    Mental Status Per Nursing Assessment::   On Admission:  NA  Demographic Factors:  Unemployed  Loss Factors: Decrease in vocational status  Historical Factors: NA  Risk Reduction Factors:   Positive social support  Continued Clinical Symptoms:  Alcohol/Substance Abuse/Dependencies  Cognitive Features That Contribute To Risk:  Closed-mindedness and Polarized thinking    Suicide Risk:  Mild:  Suicidal ideation of limited frequency, intensity, duration, and  specificity.  There are no identifiable plans, no associated intent, mild dysphoria and related symptoms, good self-control (both objective and subjective assessment), few other risk factors, and identifiable protective factors, including available and accessible social support.  [facility] Follow up.   Specialty: Behavioral Health Why: Referral faxed 11/30, patient is on the waitlist. Contact information: [place]. Edisto Sharp [phone]         Patient at very high risk for a nonintentional overdose  Johnn Hai, MD 11/22/2019, 8:10 AM

## 2019-11-22 NOTE — Progress Notes (Signed)
CSW met with patient at bedside to discuss discharge plans.  Patient declines outpatient follow up appointments or referrals. Patient states she will return home with her mother "and probably go to rehab again." CSW inquired if patient has current outpatient providers, patient says she meets with a therapist for virtual appointments and has a psychiatrist based out of [place]. Patient reports she will follow up with providers on her own.  Stephanie Acre, MSW, [place] Social Worker [facility]  [phone]

## 2019-11-27 DIAGNOSIS — Z03818 Encounter for observation for suspected exposure to other biological agents ruled out: Secondary | ICD-10-CM | POA: Diagnosis not present

## 2019-11-27 DIAGNOSIS — Z113 Encounter for screening for infections with a predominantly sexual mode of transmission: Secondary | ICD-10-CM | POA: Diagnosis not present

## 2019-11-27 DIAGNOSIS — F1510 Other stimulant abuse, uncomplicated: Secondary | ICD-10-CM | POA: Diagnosis not present

## 2019-11-27 DIAGNOSIS — R000 Tachycardia, unspecified: Secondary | ICD-10-CM | POA: Diagnosis not present

## 2019-12-19 ENCOUNTER — Other Ambulatory Visit: Payer: Self-pay

## 2019-12-19 ENCOUNTER — Emergency Department (HOSPITAL_BASED_OUTPATIENT_CLINIC_OR_DEPARTMENT_OTHER)
Admission: EM | Admit: 2019-12-19 | Discharge: 2019-12-19 | Disposition: A | Discharge status: Home / Self Care | Payer: BC Managed Care – PPO | Attending: Emergency Medicine | Admitting: Emergency Medicine

## 2019-12-19 ENCOUNTER — Encounter (HOSPITAL_BASED_OUTPATIENT_CLINIC_OR_DEPARTMENT_OTHER): Payer: Self-pay | Admitting: *Deleted

## 2019-12-19 DIAGNOSIS — N61 Mastitis without abscess: Secondary | ICD-10-CM | POA: Diagnosis not present

## 2019-12-19 DIAGNOSIS — Z79899 Other long term (current) drug therapy: Secondary | ICD-10-CM | POA: Insufficient documentation

## 2019-12-19 DIAGNOSIS — F17210 Nicotine dependence, cigarettes, uncomplicated: Secondary | ICD-10-CM | POA: Insufficient documentation

## 2019-12-19 DIAGNOSIS — N644 Mastodynia: Secondary | ICD-10-CM | POA: Diagnosis present

## 2019-12-19 LAB — BASIC METABOLIC PANEL
Anion gap: 8 (ref 5–15)
BUN: 9 mg/dL (ref 6–20)
CO2: 25 mmol/L (ref 22–32)
Calcium: 9.1 mg/dL (ref 8.9–10.3)
Chloride: 105 mmol/L (ref 98–111)
Creatinine, Ser: 0.7 mg/dL (ref 0.44–1.00)
GFR calc Af Amer: >60 mL/min (ref >60)
GFR calc non Af Amer: >60 mL/min (ref >60)
Glucose, Bld: 100 mg/dL — ABNORMAL HIGH (ref 70–99)
Potassium: 3.7 mmol/L (ref 3.5–5.1)
Sodium: 138 mmol/L (ref 135–145)

## 2019-12-19 LAB — CBC WITH DIFFERENTIAL/PLATELET
Abs Immature Granulocytes: 0.02 10*3/uL (ref 0.00–0.07)
Basophils Absolute: 0 10*3/uL (ref 0.0–0.1)
Basophils Relative: 0 %
Eosinophils Absolute: 0.1 10*3/uL (ref 0.0–0.5)
Eosinophils Relative: 1 %
HCT: 36.8 % (ref 36.0–46.0)
Hemoglobin: 11.7 g/dL — ABNORMAL LOW (ref 12.0–15.0)
Immature Granulocytes: 0 %
Lymphocytes Relative: 23 %
Lymphs Abs: 2.2 10*3/uL (ref 0.7–4.0)
MCH: 27.5 pg (ref 26.0–34.0)
MCHC: 31.8 g/dL (ref 30.0–36.0)
MCV: 86.6 fL (ref 80.0–100.0)
Monocytes Absolute: 0.6 10*3/uL (ref 0.1–1.0)
Monocytes Relative: 7 %
Neutro Abs: 6.4 10*3/uL (ref 1.7–7.7)
Neutrophils Relative %: 69 %
Platelets: 428 10*3/uL — ABNORMAL HIGH (ref 150–400)
RBC: 4.25 MIL/uL (ref 3.87–5.11)
RDW: 13.2 % (ref 11.5–15.5)
WBC: 9.2 10*3/uL (ref 4.0–10.5)
nRBC: 0 % (ref 0.0–0.2)

## 2019-12-19 MED ORDER — CEPHALEXIN 500 MG PO CAPS: 500.0000 mg | ORAL_CAPSULE | Freq: Four times a day (QID) | ORAL | 0 refills | Status: AC

## 2019-12-19 MED ORDER — SODIUM CHLORIDE 0.9 % IV SOLN
1.0000 g | Freq: Once | INTRAVENOUS | Status: AC
  Administered 2019-12-19: 1 g via INTRAVENOUS
  Filled 2019-12-19: qty 10

## 2019-12-19 MED ORDER — DOXYCYCLINE HYCLATE 100 MG PO TABS: 100.0000 mg | ORAL_TABLET | Freq: Two times a day (BID) | ORAL | 0 refills | Status: DC

## 2019-12-19 MED ORDER — SODIUM CHLORIDE 0.9 % IV SOLN
INTRAVENOUS | Status: DC | PRN
  Administered 2019-12-19: 500 mL via INTRAVENOUS

## 2019-12-19 NOTE — Discharge Instructions (Addendum)
Return here tomorrow for recheck.  Soak area 20 minutes 4 times a day 

## 2019-12-19 NOTE — ED Provider Notes (Signed)
[facility] EMERGENCY DEPARTMENT Provider Note   CSN: [id number] Arrival date & time: 12/19/19  1511     History Chief Complaint  Patient presents with  . Abscess    Danielle Prince is a 23 y.o. female.  Pt sent here for evaluation of possible breast abscess.  Pt attempted to inject drugs into a vein in her left breast.   The history is provided by the patient. No language interpreter was used.  Abscess Location:  Torso Torso abscess location:  L breast Size:  10 Abscess quality: painful and redness   Pain details:    Quality:  Aching   Severity:  Moderate Chronicity:  New Relieved by:  Nothing Worsened by:  Nothing Ineffective treatments:  None tried Associated symptoms: fever        Past Medical History:  Diagnosis Date  . ADHD (attention deficit hyperactivity disorder)   . ADHD (attention deficit hyperactivity disorder)   . Anorexia nervosa with bulimia   . Anxiety   . Aspirin overdose 02/2012  . Depression   . Dysrhythmia   . Eating disorder   . Electrolyte disturbance 2010, 2012   hypokalemia, hypochloremia.  . Urinary tract infection   . Varicella   . Vision abnormalities     Patient Active Problem List   Diagnosis Date Noted  . 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[place])   . Bipolar I disorder with mania ([place]) 11/16/2019  . Microcytic anemia 07/09/2019  . Chest pain 07/03/2019  . IV drug user 07/03/2019  . Fatigue 07/03/2019  . Depression   . MDD (major depressive disorder), recurrent episode ([place]) 02/09/2017  . Dyspareunia 04/15/2014  . Esophagitis, acute 10/28/2013  . Candidiasis of vulva and vagina 08/28/2013  . Vaginitis and vulvovaginitis, unspecified 06/26/2013  . Generalized anxiety disorder 03/02/2012  . Oppositional defiant disorder 03/02/2012  . Bulimia nervosa 02/26/2012  . Severe episode of recurrent major depressive disorder, without psychotic features ([place]) 02/26/2012  . Urinary tract infection   . Vision  abnormalities     Past Surgical History:  Procedure Laterality Date  . COLONOSCOPY WITH ESOPHAGOGASTRODUODENOSCOPY (EGD) N/A 10/28/2013   Procedure: COLONOSCOPY WITH ESOPHAGOGASTRODUODENOSCOPY (EGD);  Surgeon: Jerene Bears, MD;  Location[facility] END[facility]COPY;  Service: Endoscopy;  Laterality: N/A;  . GUM SURGERY    . INDUCED ABORTION       OB History    Gravida  2   Para  0   Term  0   Preterm  0   AB  2   Living  0     SAB  0   TAB  2   Ectopic  0   Multiple  0   Live Births              Family History  Problem Relation Age of Onset  . Cancer Father   . Depression Sister   . Depression Paternal Aunt   . Drug abuse Maternal Uncle        deceased  . Drug abuse Paternal Aunt   . Colon cancer Maternal Grandmother     Social History   Tobacco Use  . Smoking status: Current Some Day Smoker    Packs/day: 0.50    Types: Cigarettes  . Smokeless tobacco: Never Used  Substance Use Topics  . Alcohol use: Not Currently    Alcohol/week: 0.0 standard drinks  . Drug use: Yes    Types: Methamphetamines, IV    Comment: use $40 twice weekly    Home Medications Prior  to Admission medications   Medication Sig Start Date End Date Taking? Authorizing Provider  ARIPiprazole ER (ABILIFY MAINTENA) 400 MG SRER injection Inject 2 mLs (400 mg total) into the muscle every 28 (twenty-eight) days. Due 12/22/2019 12/19/19   Malvin JohnsFarah, Brian, MD  carbamazepine (TEGRETOL) 100 MG chewable tablet Chew 1 tablet (100 mg total) by mouth 3 (three) times daily. 11/22/19   Malvin JohnsFarah, Brian, MD  cephALEXin (KEFLEX) 500 MG capsule Take 1 capsule (500 mg total) by mouth 4 (four) times daily for 10 days. 12/19/19 12/29/19  Elson AreasSofia, Ruthanne Mcneish K, PA-C  doxycycline (VIBRA-TABS) 100 MG tablet Take 1 tablet (100 mg total) by mouth 2 (two) times daily. 12/19/19   Elson AreasSofia, Mry Lamia K, PA-C  gabapentin (NEURONTIN) 300 MG capsule Take 1 capsule (300 mg total) by mouth 3 (three) times daily. 11/22/19   Malvin JohnsFarah, Brian, MD    OLANZapine zydis [facility](ZYPREXA) 20 MG disintegrating tablet 1 hs 11/22/19   Malvin JohnsFarah, Brian, MD    Allergies    Patient has no known allergies.  Review of Systems   Review of Systems  Constitutional: Positive for fever.  All other systems reviewed and are negative.   Physical Exam Updated Vital Signs BP 113/77   Pulse 86   Temp 98.4 F (36.9 C)   Resp 16   Ht 5\' 10"  (1.778 m)   Wt 77.1 kg   SpO2 100%   BMI 24.39 kg/m   Physical Exam Vitals and nursing note reviewed.  Constitutional:      Appearance: She is well-developed.  HENT:     Head: Normocephalic.  Cardiovascular:     Rate and Rhythm: Normal rate and regular rhythm.  Pulmonary:     Effort: Pulmonary effort is normal.  Abdominal:     General: There is no distension.     Palpations: Abdomen is soft.  Musculoskeletal:        General: Normal range of motion.     Cervical back: Normal range of motion.  Skin:    General: Skin is warm.     Comments: Left breast 10cm area of erythema, no obvious fluctuance   Neurological:     Mental Status: She is alert and oriented to person, place, and time.     ED Results / Procedures / Treatments   Labs (all labs ordered are listed, but only abnormal results are displayed) Labs Reviewed  CBC WITH DIFFERENTIAL/PLATELET - Abnormal; Notable for the following components:      Result Value   Hemoglobin 11.7 (*)    Platelets 428 (*)    All other components within normal limits  BASIC METABOLIC PANEL - Abnormal; Notable for the following components:   Glucose, Bld 100 (*)    All other components within normal limits    EKG None  Radiology No results found.  Procedures Procedures (including critical care time)  Medications Ordered in ED Medications  0.9 %  sodium chloride infusion ( Intravenous Stopped 12/19/19 1829)  cefTRIAXone (ROCEPHIN) 1 g in sodium chloride 0.9 % 100 mL IVPB ( Intravenous Stopped 12/19/19 1828)    ED Course  I have reviewed the triage vital  signs and the nursing notes.  Pertinent labs & imaging results that were available during my care of the patient were reviewed by me and considered in my medical decision making (see chart for details).    MDM Rules/Calculators/A&P                      MDM  Ultrasound, no abscess seen.  Pt given rocephin 1 gram,  Rx for doxycycline and keflex.  Pt advised to recheck here tomorrow. Pt advised of possibility of early abscess.  Final Clinical Impression(s) / ED Diagnoses Final diagnoses:  Cellulitis of left breast    Rx / DC Orders ED Discharge Orders         Ordered    doxycycline (VIBRA-TABS) 100 MG tablet  2 times daily     12/19/19 1907    cephALEXin (KEFLEX) 500 MG capsule  4 times daily     12/19/19 1907        An After Visit Summary was printed and given to the patient.    Elson Areas, PA-C 12/19/19 2302    Rolan Bucco, MD 12/20/19 0002

## 2019-12-19 NOTE — ED Triage Notes (Signed)
Pt c/o abscess to left breast from IV drug use x 3 days

## 2019-12-21 ENCOUNTER — Emergency Department (HOSPITAL_BASED_OUTPATIENT_CLINIC_OR_DEPARTMENT_OTHER)
Admission: EM | Admit: 2019-12-21 | Discharge: 2019-12-21 | Disposition: A | Discharge status: Home / Self Care | Payer: 59 | Attending: Emergency Medicine | Admitting: Emergency Medicine

## 2019-12-21 ENCOUNTER — Encounter (HOSPITAL_BASED_OUTPATIENT_CLINIC_OR_DEPARTMENT_OTHER): Payer: Self-pay | Admitting: *Deleted

## 2019-12-21 ENCOUNTER — Other Ambulatory Visit: Payer: Self-pay

## 2019-12-21 DIAGNOSIS — N61 Mastitis without abscess: Secondary | ICD-10-CM | POA: Diagnosis not present

## 2019-12-21 DIAGNOSIS — Z79899 Other long term (current) drug therapy: Secondary | ICD-10-CM | POA: Diagnosis not present

## 2019-12-21 NOTE — ED Notes (Signed)
Left breast is red and patient stated that she still feel the lump.

## 2019-12-21 NOTE — ED Notes (Signed)
ED Provider at bedside. 

## 2019-12-21 NOTE — ED Provider Notes (Signed)
[facility] EMERGENCY DEPARTMENT Provider Note   CSN: [id number] Arrival date & time: 12/21/19  1013     History Chief Complaint  Patient presents with  . Breast infection    Danielle Prince is a 24 y.o. female history of bipolar, polysubstance abuse, IV drug use.  Patient presents today for recheck of cellulitis of the left breast.  She was seen during the afternoon of 12/19/2019.  Work-up during that visit included CBC without leukocytosis no acute findings, BMP glucose 100 nonacute, esophageal ultrasound performed by physician assistant Per their MDM no abscess seen.  Patient was given 1 g IV Rocephin and discharged with doxycycline and Keflex and advised to return for recheck.  Patient reports that she filled her prescription immediately after leaving the ER on 12/19/2019 and has been compliant with medications doxycycline and Keflex as prescribed.  She reports that the erythema to the superior portion of her left breast has been improving and her pain has been decreasing, she reports a mild aching sensation to the area only with palpation improved with rest and antibiotics, nonradiating.  Patient reports that she felt warm yesterday but did not measure a fever, no antipyretic use prior to arrival.  She denies any headache, vision changes, neck stiffness, chest pain/shortness of breath, nausea/vomiting, numbness/weakness or any additional concerns.  Of note patient reports abscesses from attempting to inject methamphetamine into a vein on her left breast.  She reports that she is currently working with a psychiatrist to help with polysubstance abuse but would like resources for outpatient rehabilitation centers.  HPI     Past Medical History:  Diagnosis Date  . ADHD (attention deficit hyperactivity disorder)   . ADHD (attention deficit hyperactivity disorder)   . Anorexia nervosa with bulimia   . Anxiety   . Aspirin overdose 02/2012  . Depression   . Dysrhythmia   .  Eating disorder   . Electrolyte disturbance 2010, 2012   hypokalemia, hypochloremia.  . Urinary tract infection   . Varicella   . Vision abnormalities     Patient Active Problem List   Diagnosis Date Noted  . 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 Bipolar I disorder with mania ([facility]) 11/16/2019  . Microcytic anemia 07/09/2019  . Chest pain 07/03/2019  . IV drug user 07/03/2019  . Fatigue 07/03/2019  . Depression   . MDD (major depressive disorder), recurrent episode ([facility]) 02/09/2017  . Dyspareunia 04/15/2014  . Esophagitis, acute 10/28/2013  . Candidiasis of vulva and vagina 08/28/2013  . Vaginitis and vulvovaginitis, unspecified 06/26/2013  . Generalized anxiety disorder 03/02/2012  . Oppositional defiant disorder 03/02/2012  . Bulimia nervosa 02/26/2012  . Severe episode of recurrent major depressive disorder, without psychotic features ([facility]) 02/26/2012  . Urinary tract infection   . Vision abnormalities     Past Surgical History:  Procedure Laterality Date  . COLONOSCOPY WITH ESOPHAGOGASTRODUODENOSCOPY (EGD) N/A 10/28/2013   Procedure: COLONOSCOPY WITH ESOPHAGOGASTRODUODENOSCOPY (EGD);  Surgeon: Beverley Fiedler, MD;  Location: [facility] ENDOSCOPY;  Service: Endoscopy;  Laterality: N/A;  . GUM SURGERY    . INDUCED ABORTION       OB History    Gravida  2   Para  0   Term  0   Preterm  0   AB  2   Living  0     SAB  0   TAB  2   Ectopic  0   Multiple  0   Live Births  Family History  Problem Relation Age of Onset  . Cancer Father   . Depression Sister   . Depression Paternal Aunt   . Drug abuse Maternal Uncle        deceased  . Drug abuse Paternal Aunt   . Colon cancer Maternal Grandmother     Social History   Tobacco Use  . Smoking status: Current Some Day Smoker    Packs/day: 0.50    Types: Cigarettes  . Smokeless tobacco: Never Used  Substance Use Topics  . Alcohol use: Not Currently    Alcohol/week: 0.0 standard  drinks  . Drug use: Yes    Types: Methamphetamines, IV    Comment: use $40 twice weekly    Home Medications Prior to Admission medications   Medication Sig Start Date End Date Taking? Authorizing Provider  carbamazepine (TEGRETOL) 100 MG chewable tablet Chew 1 tablet (100 mg total) by mouth 3 (three) times daily. 11/22/19  Yes Malvin Johns, MD  cephALEXin (KEFLEX) 500 MG capsule Take 1 capsule (500 mg total) by mouth 4 (four) times daily for 10 days. 12/19/19 12/29/19 Yes Elson Areas, PA-C  doxycycline (VIBRA-TABS) 100 MG tablet Take 1 tablet (100 mg total) by mouth 2 (two) times daily. 12/19/19  Yes Cheron Schaumann K, PA-C  gabapentin (NEURONTIN) 300 MG capsule Take 1 capsule (300 mg total) by mouth 3 (three) times daily. 11/22/19  Yes Malvin Johns, MD  OLANZapine zydis (ZYPREXA) 20 MG disintegrating tablet 1 hs 11/22/19  Yes Malvin Johns, MD  ARIPiprazole ER (ABILIFY MAINTENA) 400 MG SRER injection Inject 2 mLs (400 mg total) into the muscle every 28 (twenty-eight) days. Due 12/22/2019 12/19/19   Malvin Johns, MD    Allergies    Patient has no known allergies.  Review of Systems   Review of Systems Ten systems are reviewed and are negative for acute change except as noted in the HPI  Physical Exam Updated Vital Signs BP 115/64 (BP Location: Right Arm)   Pulse 97   Temp 98.4 F (36.9 C) (Oral)   Resp 14   Ht 5\' 10"  (1.778 m)   Wt 77.1 kg   SpO2 100%   BMI 24.39 kg/m   Physical Exam Constitutional:      General: She is not in acute distress.    Appearance: Normal appearance. She is well-developed. She is not ill-appearing or diaphoretic.  HENT:     Head: Normocephalic and atraumatic.     Right Ear: External ear normal.     Left Ear: External ear normal.     Nose: Nose normal.  Eyes:     General: Vision grossly intact. Gaze aligned appropriately.     Pupils: Pupils are equal, round, and reactive to light.  Neck:     Trachea: Trachea and phonation normal. No tracheal  deviation.  Pulmonary:     Effort: Pulmonary effort is normal. No respiratory distress.  Chest:       Comments: Examination chaperoned by RN.  Approximately 7-8 cm in diameter area of erythema to the superior left breast, small area of induration centrally no fluctuance, no drainage, does not involve the areola.  Patient reports appears improved from 2 days ago.  Area is mildly tender to touch. Abdominal:     General: There is no distension.     Palpations: Abdomen is soft.     Tenderness: There is no abdominal tenderness. There is no guarding or rebound.  Musculoskeletal:        General:  Normal range of motion.     Cervical back: Normal range of motion.  Skin:    General: Skin is warm and dry.  Neurological:     Mental Status: She is alert.     GCS: GCS eye subscore is 4. GCS verbal subscore is 5. GCS motor subscore is 6.     Comments: Speech is clear and goal oriented, follows commands Major Cranial nerves without deficit, no facial droop Moves extremities without ataxia, coordination intact  Psychiatric:        Behavior: Behavior normal.     ED Results / Procedures / Treatments   Labs (all labs ordered are listed, but only abnormal results are displayed) Labs Reviewed - No data to display  EKG None  Radiology No results found.  Procedures Ultrasound ED Soft Tissue  Date/Time: 12/21/2019 11:25 AM Performed by: Deliah Boston, PA-C Authorized by: Deliah Boston, PA-C   Procedure details:    Indications: localization of abscess and evaluate for cellulitis     Transverse view:  Visualized   Longitudinal view:  Visualized   Images: archived   Location:    Location: breast     Side:  Left Findings:     no abscess present    cellulitis present    no foreign body present Comments:     Ultrasound chaperoned by Silver Huguenin RN.   (including critical care time)  Medications Ordered in ED Medications - No data to display  ED Course  I have  reviewed the triage vital signs and the nursing notes.  Pertinent labs & imaging results that were available during my care of the patient were reviewed by me and considered in my medical decision making (see chart for details).    MDM Rules/Calculators/A&P                     24 year old female arrives for recheck of cellulitis of the superior left breast.  She received IV Rocephin 2 days ago and has been compliant with Keflex/doxycycline therapy previously prescribed.  She reports the area is improving, erythema has decreased in size and pain has improved as well.  Ultrasound performed today shows cobblestoning cellulitis without discrete abscess.  Do not feel incision drainage would be beneficial at this time.  I advised patient have the area rechecked in 2 days and to continue with previously prescribed antibiotics.  She is aware to return to the ER sooner than 2 days if she develops any new or worsening symptoms.  Additionally she is requesting resources regarding her polysubstance abuse, resource guidance given.  Patient well-appearing, pleasant, no acute distress does not appear to be danger to herself or others and is stable for discharge.  At this time there does not appear to be any evidence of an acute emergency medical condition and the patient appears stable for discharge with appropriate outpatient follow up. Diagnosis was discussed with patient who verbalizes understanding of care plan and is agreeable to discharge. I have discussed return precautions with patient who verbalizes understanding of return precautions. Patient encouraged to follow-up with their PCP. All questions answered.   Note: Portions of this report may have been transcribed using voice recognition software. Every effort was made to ensure accuracy; however, inadvertent computerized transcription errors may still be present. Final Clinical Impression(s) / ED Diagnoses Final diagnoses:  Cellulitis of left breast     Rx / DC Orders ED Discharge Orders    None  Elizabeth Palau 12/21/19 1132    Vanetta Mulders, MD 12/30/19 [phone]

## 2019-12-21 NOTE — Discharge Instructions (Addendum)
You have been diagnosed today with Cellulitis of the Left Breast.  At this time there does not appear to be the presence of an emergent medical condition, however there is always the potential for conditions to change. Please read and follow the below instructions.  Please return to the Emergency Department immediately for any new or worsening symptoms. Please be sure to follow up with your Primary Care Provider within one week regarding your visit today; please call their office to schedule an appointment even if you are feeling better for a follow-up visit. Please continue take the antibiotics Keflex and doxycycline as prescribed to you during your last visit.  Please be sure to return to the ER for recheck of your infection in 2 days.  As we discussed there is no abscess seen on ultrasound today however it is possible that an abscess may form that requires incision and drainage.  Hopefully this infection will continue to improve with antibiotics but it is important to have the area rechecked in two days to ensure it continues to improve. Please use the resource guide attached to this discharge paperwork to find help regarding polysubstance abuse.  Get help right away if: Your symptoms get worse. You feel very sleepy. You throw up (vomit) or have watery poop (diarrhea) for a long time. You see red streaks coming from the area. Your red area gets larger. Your red area turns dark in color. You have thoughts of hurting your self or others You have any new/concerning or worsening symptoms  Please read the additional information packets attached to your discharge summary.  Do not take your medicine if  develop an itchy rash, swelling in your mouth or lips, or difficulty breathing; call 911 and seek immediate emergency medical attention if this occurs.  Note: Portions of this text may have been transcribed using voice recognition software. Every effort was made to ensure accuracy; however,  inadvertent computerized transcription errors may still be present.

## 2019-12-21 NOTE — ED Triage Notes (Signed)
Patient was here on the 30th for the same complaint.  Advised to return to ED for follow up.

## 2020-02-13 ENCOUNTER — Ambulatory Visit: Payer: BC Managed Care – PPO | Admitting: Obstetrics

## 2020-03-03 ENCOUNTER — Ambulatory Visit: Payer: BC Managed Care – PPO | Admitting: Obstetrics

## 2020-04-13 ENCOUNTER — Encounter (HOSPITAL_COMMUNITY): Payer: Self-pay | Admitting: Nurse Practitioner

## 2020-04-13 ENCOUNTER — Observation Stay (HOSPITAL_COMMUNITY)
Admission: RE | Admit: 2020-04-13 | Discharge: 2020-04-14 | Disposition: A | Discharge status: Home / Self Care | Payer: 59 | Attending: Psychiatry | Admitting: Psychiatry

## 2020-04-13 ENCOUNTER — Other Ambulatory Visit: Payer: Self-pay

## 2020-04-13 DIAGNOSIS — G4700 Insomnia, unspecified: Secondary | ICD-10-CM | POA: Diagnosis not present

## 2020-04-13 DIAGNOSIS — Z8489 Family history of other specified conditions: Secondary | ICD-10-CM | POA: Insufficient documentation

## 2020-04-13 DIAGNOSIS — Z800 Family history of malignant neoplasm of digestive organs: Secondary | ICD-10-CM | POA: Diagnosis not present

## 2020-04-13 DIAGNOSIS — F3110 Bipolar disorder, current episode manic without psychotic features, unspecified: Secondary | ICD-10-CM

## 2020-04-13 DIAGNOSIS — Z20822 Contact with and (suspected) exposure to covid-19: Secondary | ICD-10-CM | POA: Insufficient documentation

## 2020-04-13 DIAGNOSIS — Z79899 Other long term (current) drug therapy: Secondary | ICD-10-CM | POA: Diagnosis not present

## 2020-04-13 DIAGNOSIS — F502 Bulimia nervosa: Secondary | ICD-10-CM | POA: Diagnosis not present

## 2020-04-13 DIAGNOSIS — F15959 Other stimulant use, unspecified with stimulant-induced psychotic disorder, unspecified: Secondary | ICD-10-CM | POA: Diagnosis present

## 2020-04-13 DIAGNOSIS — F319 Bipolar disorder, unspecified: Secondary | ICD-10-CM | POA: Diagnosis not present

## 2020-04-13 DIAGNOSIS — F909 Attention-deficit hyperactivity disorder, unspecified type: Secondary | ICD-10-CM | POA: Insufficient documentation

## 2020-04-13 DIAGNOSIS — F17210 Nicotine dependence, cigarettes, uncomplicated: Secondary | ICD-10-CM | POA: Insufficient documentation

## 2020-04-13 DIAGNOSIS — Z818 Family history of other mental and behavioral disorders: Secondary | ICD-10-CM | POA: Diagnosis not present

## 2020-04-13 DIAGNOSIS — F411 Generalized anxiety disorder: Secondary | ICD-10-CM | POA: Diagnosis not present

## 2020-04-13 DIAGNOSIS — F15159 Other stimulant abuse with stimulant-induced psychotic disorder, unspecified: Secondary | ICD-10-CM | POA: Diagnosis not present

## 2020-04-13 DIAGNOSIS — Z809 Family history of malignant neoplasm, unspecified: Secondary | ICD-10-CM | POA: Insufficient documentation

## 2020-04-13 LAB — RESPIRATORY PANEL BY RT PCR (FLU A&B, COVID)
Influenza A by PCR: NEGATIVE
Influenza B by PCR: NEGATIVE
SARS Coronavirus 2 by RT PCR: NEGATIVE

## 2020-04-13 LAB — RAPID URINE DRUG SCREEN, HOSP PERFORMED
Amphetamines: POSITIVE — AB
Barbiturates: NOT DETECTED
Benzodiazepines: NOT DETECTED
Cocaine: NOT DETECTED
Opiates: NOT DETECTED
Tetrahydrocannabinol: NOT DETECTED

## 2020-04-13 LAB — PREGNANCY, URINE: Preg Test, Ur: NEGATIVE

## 2020-04-13 MED ORDER — FAMOTIDINE 10 MG PO TABS
40.0000 mg | ORAL_TABLET | Freq: Once | ORAL | Status: AC
  Administered 2020-04-13: 40 mg via ORAL
  Filled 2020-04-13: qty 4

## 2020-04-13 MED ORDER — MAGNESIUM HYDROXIDE 400 MG/5ML PO SUSP: 30.0000 mL | Freq: Every day | ORAL | Status: DC | PRN

## 2020-04-13 MED ORDER — ACETAMINOPHEN 325 MG PO TABS: 650.0000 mg | ORAL_TABLET | Freq: Four times a day (QID) | ORAL | Status: DC | PRN

## 2020-04-13 MED ORDER — ALUM & MAG HYDROXIDE-SIMETH 200-200-20 MG/5ML PO SUSP: 30.0000 mL | ORAL | Status: DC | PRN

## 2020-04-13 MED ORDER — ONDANSETRON 4 MG PO TBDP
4.0000 mg | ORAL_TABLET | Freq: Three times a day (TID) | ORAL | Status: DC | PRN
  Administered 2020-04-13: 4 mg via ORAL
  Filled 2020-04-13: qty 1

## 2020-04-13 MED ORDER — LORAZEPAM 1 MG PO TABS
1.0000 mg | ORAL_TABLET | Freq: Once | ORAL | Status: AC
  Administered 2020-04-13: 1 mg via ORAL
  Filled 2020-04-13: qty 1

## 2020-04-13 MED ORDER — HYDROXYZINE HCL 25 MG PO TABS: 25.0000 mg | ORAL_TABLET | Freq: Three times a day (TID) | ORAL | Status: DC | PRN

## 2020-04-13 MED ORDER — GABAPENTIN 300 MG PO CAPS
300.0000 mg | ORAL_CAPSULE | Freq: Three times a day (TID) | ORAL | Status: DC
  Administered 2020-04-13 – 2020-04-14 (×5): 300 mg via ORAL
  Filled 2020-04-13 (×5): qty 1

## 2020-04-13 NOTE — Plan of Care (Signed)
[facility] Observation Crisis Plan  Reason for Crisis Plan:  Crisis Stabilization   Plan of Care:  Referral for Inpatient Hospitalization  Family Support:      Current Living Environment:  Living Arrangements: Parent, Other relatives  Insurance:   Hospital Account    Name Acct ID Class Status Primary Coverage   Hurley, Blevins [id number] BEHAVIORAL HEALTH OBSERVATION Open [place] NAP        Guarantor Account (for Hospital Account [id number])    Name Relation to Pt Service Area Active? Acct Type   [name] Self [facility]   Address Phone       [address] [place], [place] [address] [phone](H)          Coverage Information (for Hospital Account [id number])    F/O Payor/Plan Precert #   AETNA/AETNA NAP    Subscriber Subscriber #   [place], Basich [id number]   Address Phone   [address] [address] [place] [address] [phone]      Legal Guardian:  Legal Guardian: Other:(Self)  Primary Care Provider:  Patient, No Pcp Per  Current Outpatient Providers:  Dr Lorrene Reid  Psychiatrist:  Name of Psychiatrist: Dr. Lorrene Reid Kathryne Sharper; has been seeing for 8 - 12 months  Counselor/Therapist:  Name of Therapist: None  Compliant with Medications:  No  Additional Information:   Tasia Catchings 4/25/20215:13 AM

## 2020-04-13 NOTE — Progress Notes (Signed)
Patient ID: Danielle Prince, female   DOB: 07-15-1996, 24 y.o.   MRN: [id number] Pt A&O x 4, resting at present, cooperative and irritable.  Monitoring for safety, No distress noted.  Denies SI, HI or AVH.

## 2020-04-13 NOTE — Progress Notes (Signed)
D: Pt denies SI/HI/AV hallucinations. Pt is pleasant and cooperative. Pt is in no distress. Patient has been sleeping most of day, awakens for meals. A: Pt was offered support and encouragement. Pt was given scheduled medications.  Q 15 minute checks were done for safety.  R: Pt is taking medication. Pt has no complaints.Pt receptive to treatment and safety maintained on unit.
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
## 2020-04-13 NOTE — H&P (Signed)
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 female who was brought to [facility] under IVC paperwork that had been completed by her mother due to concerns about some of pt's more recent behaviors. The IVC states:  "Stated to mother she was going to kill herself my jumping off a bridge or kill someone. Respondent has been committed in the past in [place]; Danielle Prince and has been diagnosed with bipolar, depression, anxiety, anosognosia, and drug disorder according to their mother. Respondent has been prescribed gabapentin, Abilify, and another anti-psychotic medication. Respondent believes she is being poisoned through the air vents and has damaged property in her mother's home. Respondent also uses meth."  Pt denies SI, stating she did make a comment about jumping off of a bridge but that she was simply mad at her mother and wouldn't ever follow through with the act. Pt states she attempted to kill herself via o/d approximately 6 months ago, though she states she doesn't believe it was a real attempt and that she knew the amount of pills she took wasn't enough to kill her. Pt denies HI, AVH, NSSIB, access to guns/weapons, and involvement in the law. Pt acknowledged SA of 1/2 gram of methamphetamine every-other day, though she shares she stopped using one week ago.  Evaluation on Unit: Reviewed TTS assessment and validated with patient. On evaluation patient is alert and oriented x 4, pleasant, and cooperative. She initially presented as irritable/labile. She was calm during assessment with this provider.  Speech is clear and coherent. Mood is anxious and affect is congruent with mood. She does report feeling paranoid that her sister is out to get her and destroy her relationship with her mother. States that her sister puts poisons in the ventilation system.  Denies audiovisual hallucinations. No indication that patient is responding to internal stimuli. Denies suicidal ideations but acknowledges that she made suicidal statements at home. Denies homicidal ideations. Reports last use of methamphetamines was about a week ago. Denies use of other substances. States that she takes medications as prescribed but does not recall the names of the medications. States that there were some changes in her medications recently. States that she no longer takes Abilify. Reports that she has been feeling nauseated the past couple of hours and vomited after arriving to [facility]. Denies fever, chills, diarrhea, abdominal pain.    Associated Signs/Symptoms: Depression Symptoms:  depressed mood, insomnia, psychomotor agitation, anxiety, (Hypo) Manic Symptoms:  Delusions, Impulsivity, Irritable Mood, Labiality of Mood,  Psychotic Symptoms:  Delusions, Paranoia, PTSD Symptoms: NA Total Time spent with patient: 30 minutes  Past Psychiatric History: Methamphetamine induced psychosis. Inpatient at [facility] 10/2019. She reports history of a suicide attempt about a year ago(by cutting).  Chart review indicates she was hospitalized here at [facility] in February 2018.  At that time presented for depression, anxiety in the context of relationship stressors.   Is the patient at risk to self? Yes.    Has the patient been a risk to self in the past 6 months? Yes.    Has the patient been a risk to self within the distant past? No.  Is the patient a risk to others? No.  Has the patient been a risk  to others in the past 6 months? No.  Has the patient been a risk to others within the distant past? No.   Prior Inpatient Therapy:   Prior  Outpatient Therapy:    Alcohol Screening:   Substance Abuse History in the last 12 months:  Yes.   Consequences of Substance Abuse: Family Consequences:  Family discord psychosis Previous Psychotropic Medications: Yes  Psychological Evaluations: No  Past Medical History:  Past Medical History:  Diagnosis Date  . ADHD (attention deficit hyperactivity disorder)   . ADHD (attention deficit hyperactivity disorder)   . Anorexia nervosa with bulimia   . Anxiety   . Aspirin overdose 02/2012  . Depression   . Dysrhythmia   . Eating disorder   . Electrolyte disturbance 2010, 2012   hypokalemia, hypochloremia.  . Urinary tract infection   . Varicella   . Vision abnormalities     Past Surgical History:  Procedure Laterality Date  . COLONOSCOPY WITH ESOPHAGOGASTRODUODENOSCOPY (EGD) N/A 10/28/2013   Procedure: COLONOSCOPY WITH ESOPHAGOGASTRODUODENOSCOPY (EGD);  Surgeon: Beverley Fiedler, MD;  Location: [facility] ENDOSCOPY;  Service: Endoscopy;  Laterality: N/A;  . GUM SURGERY    . INDUCED ABORTION     Family History:  Family History  Problem Relation Age of Onset  . Cancer Father   . Depression Sister   . Depression Paternal Aunt   . Drug abuse Maternal Uncle        deceased  . Drug abuse Paternal Aunt   . Colon cancer Maternal Grandmother    Family Psychiatric History: unknown Tobacco Screening:   Social History:  Social History   Substance and Sexual Activity  Alcohol Use Not Currently  . Alcohol/week: 0.0 standard drinks     Social History   Substance and Sexual Activity  Drug Use Yes  . Types: Methamphetamines, IV   Comment: use $40 twice weekly    Additional Social History: Marital status: (P) Single    Pain Medications: Please see MAR Prescriptions: Please see MAR Over the Counter: Please see MAR History of alcohol / drug use?: Yes Longest period of sobriety (when/how long): Unknown Name of Substance 1: Methamphetamine 1 - Age of First Use: 20 1 - Amount (size/oz):  1/2 gram or less 1 - Frequency: Every-other day 1 - Duration: Unknown 1 - Last Use / Amount: 1 week ago     Allergies:  No Known Allergies Lab Results: No results found for this or any previous visit (from the past 48 hour(s)).  Blood Alcohol level:  Lab Results  Component Value Date   ETH <10 11/16/2019   ETH <10 02/28/2018    Metabolic Disorder Labs:  No results found for: HGBA1C, MPG No results found for: PROLACTIN No results found for: CHOL, TRIG, HDL, CHOLHDL, VLDL, LDLCALC  Current Medications: Current Facility-Administered Medications  Medication Dose Route Frequency Provider Last Rate Last Admin  . acetaminophen (TYLENOL) tablet 650 mg  650 mg Oral Q6H PRN Jackelyn Poling, NP      . alum & mag hydroxide-simeth (MAALOX/MYLANTA) 200-200-20 MG/5ML suspension 30 mL  30 mL Oral Q4H PRN Nira Conn A, NP      . gabapentin (NEURONTIN) capsule 300 mg  300 mg Oral TID Nira Conn A, NP      . hydrOXYzine (ATARAX/VISTARIL) tablet 25 mg  25 mg Oral TID PRN Nira Conn A, NP      . magnesium hydroxide (MILK OF MAGNESIA) suspension 30 mL  30 mL Oral Daily PRN Jackelyn Poling, NP      .  ondansetron (ZOFRAN-ODT) disintegrating tablet 4 mg  4 mg Oral Q8H PRN Jackelyn Poling, NP       PTA Medications: Medications Prior to Admission  Medication Sig Dispense Refill Last Dose  . ARIPiprazole ER (ABILIFY MAINTENA) 400 MG SRER injection Inject 2 mLs (400 mg total) into the muscle every 28 (twenty-eight) days. Due 12/22/2019 1 each 11   . carbamazepine (TEGRETOL) 100 MG chewable tablet Chew 1 tablet (100 mg total) by mouth 3 (three) times daily. 90 tablet 2   . doxycycline (VIBRA-TABS) 100 MG tablet Take 1 tablet (100 mg total) by mouth 2 (two) times daily. 20 tablet 0   . gabapentin (NEURONTIN) 300 MG capsule Take 1 capsule (300 mg total) by mouth 3 (three) times daily. 90 capsule 2   . OLANZapine zydis (ZYPREXA) 20 MG disintegrating tablet 1 hs 90 tablet 1     Musculoskeletal: Strength &  Muscle Tone: within normal limits Gait & Station: normal Patient leans: N/A  Psychiatric Specialty Exam: Physical Exam  Constitutional: She is oriented to person, place, and time. She appears well-developed and well-nourished. No distress.  HENT:  Head: Normocephalic and atraumatic.  Right Ear: External ear normal.  Left Ear: External ear normal.  Eyes: Pupils are equal, round, and reactive to light. Right eye exhibits no discharge. Left eye exhibits no discharge.  Respiratory: Effort normal. No respiratory distress.  Musculoskeletal:        General: Normal range of motion.  Neurological: She is alert and oriented to person, place, and time.  Skin: She is not diaphoretic.  Psychiatric: Her mood appears anxious. She is not withdrawn and not actively hallucinating. She exhibits a depressed mood. She expresses no homicidal and no suicidal ideation.    Review of Systems  Constitutional: Negative for activity change, appetite change, chills, diaphoresis, fatigue, fever and unexpected weight change.  Respiratory: Negative for cough and shortness of breath.   Cardiovascular: Negative for chest pain.  Gastrointestinal: Positive for nausea and vomiting. Negative for constipation and diarrhea.  Neurological: Negative for dizziness, seizures and headaches.  Psychiatric/Behavioral: Positive for sleep disturbance and suicidal ideas. Negative for hallucinations and self-injury. The patient is nervous/anxious.   All other systems reviewed and are negative.   Blood pressure 108/65, pulse (!) 112, temperature 98.6 F (37 C), temperature source Oral, resp. rate 20, SpO2 100 %.There is no height or weight on file to calculate BMI.  General Appearance: Casual and Fairly Groomed  Eye Contact:  Fair  Speech:  Clear and Coherent and Normal Rate  Volume:  Normal  Mood:  Anxious and Depressed  Affect:  Congruent and Depressed  Thought Process:  Coherent and Linear  Orientation:  Full (Time, Place, and  Person)  Thought Content:  Expresses that her sister is out to get her and places poison/smells in their ventilation ystem  Suicidal Thoughts:  Denies. Acknowledges that she made suicidal statements at home.  Homicidal Thoughts:  No  Memory:  Immediate;   Fair Recent;   Fair Remote;   Fair  Judgement:  Impaired  Insight:  Lacking  Psychomotor Activity:  Normal  Concentration:  Concentration: Fair and Attention Span: Fair  Recall:  [employer] of Knowledge:  Fair  Language:  Good  Akathisia:  Negative  Handed:  Right  AIMS (if indicated):     Assets:  Financial Resources/Insurance Housing Leisure Time Physical Health  ADL's:  Intact  Cognition:  WNL  Sleep:         Treatment Plan  Summary: Daily contact with patient to assess and evaluate symptoms and progress in treatment and Medication management  Observation Level/Precautions:  15 minute checks Laboratory:  CBC Chemistry Profile HCG UDS carbamazepine Psychotherapy:  Individual Medications:   gabapentin 300 mg TID for withdrawal symptoms/cravings Hydroxyzine 25 mg TID prn for anxiety Ativan 1 mg PO x 1 dose for anxiety Pepcid 40 mg x 1 dose for indigestion Zofran ODT every 8 hours prn for nausea Consultations:  social work Discharge Concerns:  safety Estimated LOS: Other:      Jackelyn Poling, NP 4/25/20214:14 AM

## 2020-04-13 NOTE — Progress Notes (Signed)
Patient ID: Danielle Prince, female   DOB: March 20, 1996, 24 y.o.   MRN: [id number] Pt A&O x 4, presents IVC by mother, brought in by [employer].  Pt reports she feels paranoid that the sister is out to destroy her relationship with the mother.  Pt reports last use of Meth x 1 week ago.  Complaint of indigestion and nausea at present.  NP Nira Conn notified, meds given.  Skin search completed, monitoring for safety.  Pt calm & cooperative at present, no distress noted.

## 2020-04-14 DIAGNOSIS — F15159 Other stimulant abuse with stimulant-induced psychotic disorder, unspecified: Secondary | ICD-10-CM | POA: Diagnosis not present

## 2020-04-14 LAB — CARBAMAZEPINE LEVEL, TOTAL: Carbamazepine Lvl: <2 ug/mL — ABNORMAL LOW (ref 4.0–12.0)

## 2020-04-14 LAB — COMPREHENSIVE METABOLIC PANEL
ALT: 16 U/L (ref 0–44)
AST: 15 U/L (ref 15–41)
Albumin: 3.2 g/dL — ABNORMAL LOW (ref 3.5–5.0)
Alkaline Phosphatase: 59 U/L (ref 38–126)
Anion gap: 7 (ref 5–15)
BUN: 14 mg/dL (ref 6–20)
CO2: 25 mmol/L (ref 22–32)
Calcium: 8.9 mg/dL (ref 8.9–10.3)
Chloride: 107 mmol/L (ref 98–111)
Creatinine, Ser: 0.82 mg/dL (ref 0.44–1.00)
GFR calc Af Amer: >60 mL/min (ref >60)
GFR calc non Af Amer: >60 mL/min (ref >60)
Glucose, Bld: 108 mg/dL — ABNORMAL HIGH (ref 70–99)
Potassium: 3.5 mmol/L (ref 3.5–5.1)
Sodium: 139 mmol/L (ref 135–145)
Total Bilirubin: 0.4 mg/dL (ref 0.3–1.2)
Total Protein: 6.6 g/dL (ref 6.5–8.1)

## 2020-04-14 LAB — CBC
HCT: 25.8 % — ABNORMAL LOW (ref 36.0–46.0)
Hemoglobin: 7.1 g/dL — ABNORMAL LOW (ref 12.0–15.0)
MCH: 18.5 pg — ABNORMAL LOW (ref 26.0–34.0)
MCHC: 27.5 g/dL — ABNORMAL LOW (ref 30.0–36.0)
MCV: 67.2 fL — ABNORMAL LOW (ref 80.0–100.0)
Platelets: 628 10*3/uL — ABNORMAL HIGH (ref 150–400)
RBC: 3.84 MIL/uL — ABNORMAL LOW (ref 3.87–5.11)
RDW: 16.6 % — ABNORMAL HIGH (ref 11.5–15.5)
WBC: 6.3 10*3/uL (ref 4.0–10.5)
nRBC: 0 % (ref 0.0–0.2)

## 2020-04-14 NOTE — Consult Note (Signed)
CPSS met with the patient at the [facility] Observation Unit in order to provide substance use recovery support and provide information for substance use recovery resources. Patient reports a history of methamphetamine use. Patient's UDS was positive for amphetamines. EtOH test was not ordered. Patient is interested in getting connected to outpatient substance use treatment as well as attending NA meetings in [facility] due to the patient's work schedule. Patient reports a past history of residential substance use treatment where the patient was involved with [facility] in [place] as well as a substance use treatment facility in [place]. CPSS talked the patient about [facility] as an option for outpatient dual diagnosis treatment due to their evening treatment services. Patient was unsure about getting connected to [place]'s [facility] due to the patient's work schedule. Patient states that she has been to [facility] in the past, and it did not work out. CPSS provided the patient with an outpatient substance use treatment center list as well as an online/in-person [facility] NA meeting list for follow up. CPSS also provided the patient with a residential substance use treatment center list and CPSS contact information. CPSS strongly encouraged the patient to follow up with CPSS if needed for further help with getting connected to substance use treatment resources or for any other help with CPSS related substance use recovery outreach services.

## 2020-04-14 NOTE — Discharge Instructions (Signed)
For your behavioral health needs, you are advised to continue treatment with your current outpatient provider. °

## 2020-04-14 NOTE — Discharge Summary (Addendum)
Discharge note    Danielle Prince was seen and examined in her room today. She is alert, oriented x 4 and coherent. She denies SI/HI. She denies AVH and does not appear to be responding to internal stimuli. She denies feelings of depression or anxiety. She expressed that her mother called the police on her after she had an altercation with her sister and her mother kicked her out of the home. She believes that her sister was trying to bully her at that the time. She denies paranoia thoughts today and no longer believes that the vents are poisoned. She verbalized having paranoia thoughts from previously using meth 1 week ago. She denies any withdrawal symptoms at this time and none noted. She declined inpt substance abuse tx but is willing to receive outpt tx. She is requesting discharge to home today and plans to stay with a relative if she can't return back home to her mother's house. She also reports having a therapist that she will continue to see for outpt tx. The pt was calm and cooperative during the assessment and did not pose a safety risk to herself or others.    Patient reports she has not been suicidal, homicidal or psychotic since coming to [facility]

## 2020-04-14 NOTE — Progress Notes (Signed)
Nursing Discharge Note:  D:Patient denies SI/HI/AVH at this time. Pt appears calm and cooperative, and no distress noted.  A: All Personal items in locker returned to pt. AVS reviewed with patient and written copy given to patient and she verbalized understanding.  R:  Pt escorted off unit to leave with mom who picked her up.

## 2020-04-14 NOTE — Progress Notes (Signed)
D: Pt denies SI/HI/AV hallucinations. Pt is pleasant and cooperative. Pt goal today is to go home. A: Pt was offered support and encouragement. Pt was given scheduled medications. Q 15 minute checks were done for safety.  R:Pt is taking medication. Pt has no complaints.Pt receptive to treatment and safety maintained on unit.

## 2020-04-14 NOTE — BH Assessment (Addendum)
[facility] Assessment Progress Note  Per Nelly Rout, MD, this pt does not require psychiatric hospitalization at this time.  Pt presents under IVC initiated by pt's mother.  It is uncertain whether pt had a First Examination completed within 24 hours of pt's arrival at the [facility] Observation Unit.  Dr Lucianne Muss has now completed a First Examination, however, rescinding IVC.  Pt is to be discharged from the Observation Unit with recommendation to continue treatment with pt's current outpatient provider.  This has been included in pt's discharge instructions.  Pt's nurse has been notified.  Doylene Canning, MA Triage Specialist [phone]

## 2020-04-14 NOTE — Discharge Summary (Signed)
  Patient to be transferred to [facility] for inpatient psychiatric treatment 

## 2020-05-09 ENCOUNTER — Observation Stay (HOSPITAL_COMMUNITY)
Admission: EM | Admit: 2020-05-09 | Discharge: 2020-05-11 | Disposition: A | Discharge status: Home / Self Care | Payer: No Typology Code available for payment source | Attending: Internal Medicine | Admitting: Internal Medicine

## 2020-05-09 ENCOUNTER — Encounter (HOSPITAL_COMMUNITY): Payer: Self-pay

## 2020-05-09 ENCOUNTER — Other Ambulatory Visit: Payer: Self-pay

## 2020-05-09 DIAGNOSIS — D649 Anemia, unspecified: Secondary | ICD-10-CM

## 2020-05-09 DIAGNOSIS — Z79899 Other long term (current) drug therapy: Secondary | ICD-10-CM | POA: Insufficient documentation

## 2020-05-09 DIAGNOSIS — Z20822 Contact with and (suspected) exposure to covid-19: Secondary | ICD-10-CM | POA: Diagnosis not present

## 2020-05-09 DIAGNOSIS — F17210 Nicotine dependence, cigarettes, uncomplicated: Secondary | ICD-10-CM | POA: Insufficient documentation

## 2020-05-09 DIAGNOSIS — Z793 Long term (current) use of hormonal contraceptives: Secondary | ICD-10-CM | POA: Insufficient documentation

## 2020-05-09 DIAGNOSIS — D509 Iron deficiency anemia, unspecified: Secondary | ICD-10-CM | POA: Diagnosis not present

## 2020-05-09 DIAGNOSIS — F1911 Other psychoactive substance abuse, in remission: Secondary | ICD-10-CM | POA: Insufficient documentation

## 2020-05-09 DIAGNOSIS — F319 Bipolar disorder, unspecified: Secondary | ICD-10-CM | POA: Diagnosis not present

## 2020-05-09 DIAGNOSIS — K644 Residual hemorrhoidal skin tags: Secondary | ICD-10-CM | POA: Insufficient documentation

## 2020-05-09 DIAGNOSIS — F502 Bulimia nervosa: Secondary | ICD-10-CM | POA: Insufficient documentation

## 2020-05-09 DIAGNOSIS — Z8719 Personal history of other diseases of the digestive system: Secondary | ICD-10-CM | POA: Diagnosis not present

## 2020-05-09 DIAGNOSIS — Z888 Allergy status to other drugs, medicaments and biological substances status: Secondary | ICD-10-CM | POA: Insufficient documentation

## 2020-05-09 DIAGNOSIS — Z6825 Body mass index (BMI) 25.0-25.9, adult: Secondary | ICD-10-CM | POA: Insufficient documentation

## 2020-05-09 DIAGNOSIS — F3110 Bipolar disorder, current episode manic without psychotic features, unspecified: Secondary | ICD-10-CM | POA: Diagnosis not present

## 2020-05-09 LAB — COMPREHENSIVE METABOLIC PANEL
ALT: 16 U/L (ref 0–44)
AST: 18 U/L (ref 15–41)
Albumin: 3.4 g/dL — ABNORMAL LOW (ref 3.5–5.0)
Alkaline Phosphatase: 54 U/L (ref 38–126)
Anion gap: 9 (ref 5–15)
BUN: 12 mg/dL (ref 6–20)
CO2: 25 mmol/L (ref 22–32)
Calcium: 8.6 mg/dL — ABNORMAL LOW (ref 8.9–10.3)
Chloride: 106 mmol/L (ref 98–111)
Creatinine, Ser: 1.02 mg/dL — ABNORMAL HIGH (ref 0.44–1.00)
GFR calc Af Amer: >60 mL/min (ref >60)
GFR calc non Af Amer: >60 mL/min (ref >60)
Glucose, Bld: 88 mg/dL (ref 70–99)
Potassium: 3.4 mmol/L — ABNORMAL LOW (ref 3.5–5.1)
Sodium: 140 mmol/L (ref 135–145)
Total Bilirubin: 0.4 mg/dL (ref 0.3–1.2)
Total Protein: 6.3 g/dL — ABNORMAL LOW (ref 6.5–8.1)

## 2020-05-09 LAB — CBC
HCT: 21.5 % — ABNORMAL LOW (ref 36.0–46.0)
Hemoglobin: 5.8 g/dL — CL (ref 12.0–15.0)
MCH: 17.4 pg — ABNORMAL LOW (ref 26.0–34.0)
MCHC: 27 g/dL — ABNORMAL LOW (ref 30.0–36.0)
MCV: 64.4 fL — ABNORMAL LOW (ref 80.0–100.0)
Platelets: 534 10*3/uL — ABNORMAL HIGH (ref 150–400)
RBC: 3.34 MIL/uL — ABNORMAL LOW (ref 3.87–5.11)
RDW: 17.1 % — ABNORMAL HIGH (ref 11.5–15.5)
WBC: 6.7 10*3/uL (ref 4.0–10.5)
nRBC: 0 % (ref 0.0–0.2)

## 2020-05-09 LAB — POC OCCULT BLOOD, ED: Fecal Occult Bld: NEGATIVE

## 2020-05-09 LAB — PREPARE RBC (CROSSMATCH)

## 2020-05-09 LAB — I-STAT BETA HCG BLOOD, ED (MC, WL, AP ONLY): I-stat hCG, quantitative: <5 m[IU]/mL (ref <5)

## 2020-05-09 LAB — SARS CORONAVIRUS 2 BY RT PCR (HOSPITAL ORDER, PERFORMED IN ~~LOC~~ HOSPITAL LAB): SARS Coronavirus 2: NEGATIVE

## 2020-05-09 MED ORDER — SODIUM CHLORIDE 0.9% IV SOLUTION: Freq: Once | INTRAVENOUS | Status: AC

## 2020-05-09 MED ORDER — SODIUM CHLORIDE 0.9 % IV BOLUS
500.0000 mL | Freq: Once | INTRAVENOUS | Status: AC
  Administered 2020-05-10: 500 mL via INTRAVENOUS

## 2020-05-09 MED ORDER — ONDANSETRON HCL 4 MG/2ML IJ SOLN: 4.0000 mg | Freq: Four times a day (QID) | INTRAMUSCULAR | Status: DC | PRN

## 2020-05-09 NOTE — ED Notes (Signed)
Pt is a difficult stick (hx of IV drug use). Coworker to attempt to start IV via ultrasound and will attempt blood draw at that time as well.

## 2020-05-09 NOTE — ED Provider Notes (Signed)
[facility]-EMERGENCY DEPT Provider Note   CSN: [id number] Arrival date & time: 05/09/20  1741     History Chief Complaint  Patient presents with  . Abnormal Lab    Danielle Prince is a 24 y.o. female.  24 y.o female with a PMH of ADHD, anorexia nervosa with bulimia, dysrhythmia presents to the ED with complaints of low hemoglobin.  According to patient she was seen by PCP about a week ago, reported she got called with her hemoglobin level "around 6.2 ", states she is felt dizzy, fatigue along with short of breath.  He does have a previous history of transfusion, reports this was due to peptic ulcers due to her continuous bulimia, she has had several episodes of vomiting a week although these have decreased since.  Is not noted any blood in her vomit.  She does endorse some rectal bleeding which occurs "sometimes, might be a hemorrhoid ".  She does have a Depo-Provera injection which she gets yearly, has not had any vaginal bleeding.  Denies any bleeding of her gums, chest pain or other complaints.   The history is provided by the patient.  Abnormal Lab      Past Medical History:  Diagnosis Date  . ADHD (attention deficit hyperactivity disorder)   . ADHD (attention deficit hyperactivity disorder)   . Anorexia nervosa with bulimia   . Anxiety   . Aspirin overdose 02/2012  . Depression   . Dysrhythmia   . Eating disorder   . Electrolyte disturbance 2010, 2012   hypokalemia, hypochloremia.  . Urinary tract infection   . Varicella   . Vision abnormalities     Patient Active Problem List   Diagnosis Date Noted  . Symptomatic anemia 05/09/2020  . 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 Bipolar depression manic phase (HCC) 11/16/2019  . Microcytic anemia 07/09/2019  . Chest pain 07/03/2019  . IV drug user 07/03/2019  . Fatigue 07/03/2019  . Depression   . MDD (major depressive disorder), recurrent episode (HCC) 02/09/2017  . Dyspareunia  04/15/2014  . Esophagitis, acute 10/28/2013  . Candidiasis of vulva and vagina 08/28/2013  . Vaginitis and vulvovaginitis, unspecified 06/26/2013  . Generalized anxiety disorder 03/02/2012  . Oppositional defiant disorder 03/02/2012  . Bulimia nervosa 02/26/2012  . Severe episode of recurrent major depressive disorder, without psychotic features (HCC) 02/26/2012  . Urinary tract infection   . Vision abnormalities     Past Surgical History:  Procedure Laterality Date  . COLONOSCOPY WITH ESOPHAGOGASTRODUODENOSCOPY (EGD) N/A 10/28/2013   Procedure: COLONOSCOPY WITH ESOPHAGOGASTRODUODENOSCOPY (EGD);  Surgeon: Beverley Fiedler, MD;  Location: [facility] ENDOSCOPY;  Service: Endoscopy;  Laterality: N/A;  . GUM SURGERY    . INDUCED ABORTION       OB History    Gravida  2   Para  0   Term  0   Preterm  0   AB  2   Living  0     SAB  0   TAB  2   Ectopic  0   Multiple  0   Live Births              Family History  Problem Relation Age of Onset  . Cancer Father   . Depression Sister   . Depression Paternal Aunt   . Drug abuse Maternal Uncle        deceased  . Drug abuse Paternal Aunt   . Colon cancer Maternal Grandmother     Social  History   Tobacco Use  . Smoking status: Current Some Day Smoker    Packs/day: 0.50    Types: Cigarettes  . Smokeless tobacco: Never Used  Substance Use Topics  . Alcohol use: Not Currently    Alcohol/week: 0.0 standard drinks  . Drug use: Yes    Types: Methamphetamines, IV    Comment: use $40 twice weekly    Home Medications Prior to Admission medications   Medication Sig Start Date End Date Taking? Authorizing Provider  ARIPiprazole (ABILIFY) 15 MG tablet Take 15 mg by mouth daily.   Yes [provider]  carbamazepine (TEGRETOL) 100 MG chewable tablet Chew 1 tablet (100 mg total) by mouth 3 (three) times daily. 11/22/19  Yes Malvin Johns, MD  gabapentin (NEURONTIN) 300 MG capsule Take 1 capsule (300 mg total) by mouth 3  (three) times daily. 11/22/19  Yes Malvin Johns, MD  lidocaine (XYLOCAINE) 2 % solution Use as directed 15 mLs in the mouth or throat every 6 (six) hours as needed for mouth pain.  04/22/20  Yes [provider]  medroxyPROGESTERone (DEPO-PROVERA) 150 MG/ML injection Inject 150 mg into the muscle every 3 (three) months.  04/28/20  Yes [provider]  omeprazole (PRILOSEC) 20 MG capsule Take 20 mg by mouth daily. 04/28/20  Yes [provider]  sulfamethoxazole-trimethoprim (BACTRIM DS) 800-160 MG tablet Take 1 tablet by mouth 2 (two) times daily. 04/28/20  Yes [provider]    Allergies    Iron  Review of Systems   Review of Systems  Constitutional: Positive for fatigue.  HENT: Negative for sore throat.   Respiratory: Positive for shortness of breath.   Cardiovascular: Negative for chest pain.  Gastrointestinal: Positive for blood in stool. Negative for abdominal pain, diarrhea, nausea and vomiting.  Genitourinary: Negative for flank pain.  Musculoskeletal: Negative for back pain.  Neurological: Positive for dizziness and light-headedness. Negative for headaches.  All other systems reviewed and are negative.   Physical Exam Updated Vital Signs BP (!) 114/53   Pulse 87   Temp 98.1 F (36.7 C)   Resp 18   Ht 5\' 10"  (1.778 m)   Wt 81.6 kg   SpO2 98%   BMI 25.83 kg/m   Physical Exam Vitals and nursing note reviewed. Exam conducted with a chaperone present.  Constitutional:      Appearance: Normal appearance. She is ill-appearing.  HENT:     Head: Normocephalic and atraumatic.     Mouth/Throat:     Mouth: Mucous membranes are dry.     Comments: Angular cheilitis noted to right side of her lips.  Eyes:     Pupils: Pupils are equal, round, and reactive to light.     Comments: conjunctiva is pale.   Neck:     Comments: Full ROM without pain.  Cardiovascular:     Rate and Rhythm: Tachycardia present.  Pulmonary:     Effort: Pulmonary effort  is normal.     Breath sounds: No wheezing or rales.     Comments: No increase work of breathing, no wheezing.  Abdominal:     General: Abdomen is flat.     Tenderness: There is no abdominal tenderness. There is no right CVA tenderness or left CVA tenderness.  Genitourinary:    Rectum: Guaiac result negative. External hemorrhoid present. No tenderness.     Comments: No gross blood on digital examination. External hemorrhoid noted.  Musculoskeletal:     Cervical back: Normal range of motion and neck  supple.  Skin:    Coloration: Skin is pale.     Comments: Appears pale without any visible rashes.   Neurological:     Mental Status: She is alert and oriented to person, place, and time.     ED Results / Procedures / Treatments   Labs (all labs ordered are listed, but only abnormal results are displayed) Labs Reviewed  CBC - Abnormal; Notable for the following components:      Result Value   RBC 3.34 (*)    Hemoglobin 5.8 (*)    HCT 21.5 (*)    MCV 64.4 (*)    MCH 17.4 (*)    MCHC 27.0 (*)    RDW 17.1 (*)    Platelets 534 (*)    All other components within normal limits  COMPREHENSIVE METABOLIC PANEL - Abnormal; Notable for the following components:   Potassium 3.4 (*)    Creatinine, Ser 1.02 (*)    Calcium 8.6 (*)    Total Protein 6.3 (*)    Albumin 3.4 (*)    All other components within normal limits  SARS CORONAVIRUS 2 BY RT PCR (HOSPITAL ORDER, PERFORMED IN [facility])  VITAMIN B12  FOLATE  IRON AND TIBC  FERRITIN  RETICULOCYTES  POC OCCULT BLOOD, ED  I-STAT BETA HCG BLOOD, ED ([facility], [facility], [facility] ONLY)  TYPE AND SCREEN  PREPARE RBC (CROSSMATCH)  ABO/RH    EKG None  Radiology No results found.  Procedures .Critical Care Performed by: Claude Manges, PA-C Authorized by: Claude Manges, PA-C   Critical care provider statement:    Critical care time (minutes):  45   Critical care start time:  05/09/2020 9:00 PM   Critical care end time:  05/09/2020 9:45  PM   Critical care was necessary to treat or prevent imminent or life-threatening deterioration of the following conditions:  Circulatory failure   Critical care was time spent personally by me on the following activities:  Discussions with consultants, evaluation of patient's response to treatment, examination of patient, ordering and performing treatments and interventions, ordering and review of laboratory studies, ordering and review of radiographic studies, pulse oximetry, re-evaluation of patient's condition, obtaining history from patient or surrogate and review of old charts   (including critical care time)  Medications Ordered in ED Medications  0.9 %  sodium chloride infusion (Manually program via Guardrails IV Fluids) (has no administration in time range)  sodium chloride 0.9 % bolus 500 mL (has no administration in time range)    ED Course  I have reviewed the triage vital signs and the nursing notes.  Pertinent labs & imaging results that were available during my care of the patient were reviewed by me and considered in my medical decision making (see chart for details).  Clinical Course as of May 09 2348  Fri May 09, 2020  2216 Worsening than PCP numbers.   Hemoglobin(!!): 5.8 [JS]    Clinical Course User Index [JS] Claude Manges, PA-C   MDM Rules/Calculators/A&P  Patient with a past medical history of symptomatic anemia presents to the ED for decrease in hemoglobin, was seen by PCP and was advised to come to the ED, her hemoglobin was 6.2 according to the last blood draw.  She does report increasing shortness of breath, dizziness, has had an elevated heart rate during her visit in the 90s to 100s during my evaluation.  Does report her previous history of symptomatic anemia occurred due to her anorexia with combined bulimia, she  denies any bleeding in her gums at this time, any bloody emesis.  Does report she has noted some blood in her stool, obtain Hemoccult.  Evaluation  patient appears pale, skin is cool to the touch, conjunctiva is pale, there is angular cheilitis to sides of her mouth.  No bleeding in her gums, oropharynx appears clear although dry.  Lungs are clear to auscultation, no murmur appreciated.  To begin delaying patient's blood work due to lab error, CBC was remarkable for hemoglobin of 5.8, this is trending downward from her previous PCP appointment which she reports hemoglobin was at 6.2.  Occult obtained by me was negative, hCG is also negative.  She denies any vaginal bleeding as she currently has a Depo-Provera injection for birth control.  11:23 PM Spoke to Pharmacist on call JC to confirm patient's allergy status of patient receiving blood in the past. Reaction was to Ferrlecit, there is a 10% on cross reaction. Do not feel she needs premedication at this time.   CMP resulted which showed mild hypokalemia other electrolyte abnormality.  Creatinine level slightly elevated, LFTs are within normal limits.  BUN is normal.  Patient does report prior episodes were due to bleeding ulcers, she did have an EGD performed in the past.  Suspect this is likely etiology of patient's recent hemoglobin.  Will place hospitalist admission for further management of symptomatic anemia.  11:49 PM Spoke to Dr. Myna Hidalgo hospitalist service who will admit patient for transfusion and observation, patient updated and plan.  Admitted in stable condition.   Portions of this note were generated with Lobbyist. Dictation errors may occur despite best attempts at proofreading.  Final Clinical Impression(s) / ED Diagnoses Final diagnoses:  Symptomatic anemia    Rx / DC Orders ED Discharge Orders    None       Corinna Capra 05/09/20 2350    Lacretia Leigh, MD 05/12/20 1104

## 2020-05-09 NOTE — ED Triage Notes (Addendum)
Patient states she was called a week ago and was told she had a low hgb of "six something". Patient states, "Well, I just am now getting here."  Patient states, "I have a problem with bulimia and my throat really hurts." when patient asked about visible bleeding in her vomitus patient stated, "I have seen blood when I blow my nose."

## 2020-05-10 ENCOUNTER — Encounter (HOSPITAL_COMMUNITY): Payer: Self-pay | Admitting: Family Medicine

## 2020-05-10 DIAGNOSIS — D649 Anemia, unspecified: Secondary | ICD-10-CM | POA: Diagnosis not present

## 2020-05-10 LAB — IRON AND TIBC
Iron: 18 ug/dL — ABNORMAL LOW (ref 28–170)
Saturation Ratios: 4 % — ABNORMAL LOW (ref 10.4–31.8)
TIBC: 417 ug/dL (ref 250–450)
UIBC: 399 ug/dL

## 2020-05-10 LAB — BASIC METABOLIC PANEL
Anion gap: 3 — ABNORMAL LOW (ref 5–15)
BUN: 10 mg/dL (ref 6–20)
CO2: 27 mmol/L (ref 22–32)
Calcium: 8.4 mg/dL — ABNORMAL LOW (ref 8.9–10.3)
Chloride: 109 mmol/L (ref 98–111)
Creatinine, Ser: 0.87 mg/dL (ref 0.44–1.00)
GFR calc Af Amer: >60 mL/min (ref >60)
GFR calc non Af Amer: >60 mL/min (ref >60)
Glucose, Bld: 102 mg/dL — ABNORMAL HIGH (ref 70–99)
Potassium: 3.7 mmol/L (ref 3.5–5.1)
Sodium: 139 mmol/L (ref 135–145)

## 2020-05-10 LAB — RETICULOCYTES
Immature Retic Fract: 19.1 % — ABNORMAL HIGH (ref 2.3–15.9)
RBC.: 3.33 MIL/uL — ABNORMAL LOW (ref 3.87–5.11)
Retic Count, Absolute: 54.3 10*3/uL (ref 19.0–186.0)
Retic Ct Pct: 1.6 % (ref 0.4–3.1)

## 2020-05-10 LAB — FERRITIN: Ferritin: 10 ng/mL — ABNORMAL LOW (ref 11–307)

## 2020-05-10 LAB — ABO/RH: ABO/RH(D): O NEG

## 2020-05-10 LAB — PREPARE RBC (CROSSMATCH)

## 2020-05-10 LAB — HEMOGLOBIN AND HEMATOCRIT, BLOOD
HCT: 23.1 % — ABNORMAL LOW (ref 36.0–46.0)
HCT: 28 % — ABNORMAL LOW (ref 36.0–46.0)
Hemoglobin: 6.5 g/dL — CL (ref 12.0–15.0)
Hemoglobin: 8.3 g/dL — ABNORMAL LOW (ref 12.0–15.0)

## 2020-05-10 LAB — FOLATE: Folate: 13.2 ng/mL (ref >5.9)

## 2020-05-10 LAB — MAGNESIUM: Magnesium: 1.9 mg/dL (ref 1.7–2.4)

## 2020-05-10 LAB — VITAMIN B12: Vitamin B-12: 236 pg/mL (ref 180–914)

## 2020-05-10 MED ORDER — VITAMIN B-12 500 MCG PO TABS: 500.0000 ug | ORAL_TABLET | Freq: Every day | ORAL | 0 refills | Status: AC

## 2020-05-10 MED ORDER — POTASSIUM CHLORIDE IN NACL 20-0.9 MEQ/L-% IV SOLN
INTRAVENOUS | Status: AC
  Filled 2020-05-10 (×2): qty 1000

## 2020-05-10 MED ORDER — NICOTINE 7 MG/24HR TD PT24
7.0000 mg | MEDICATED_PATCH | Freq: Every day | TRANSDERMAL | Status: DC
  Administered 2020-05-10: 7 mg via TRANSDERMAL
  Filled 2020-05-10: qty 1

## 2020-05-10 MED ORDER — CARBAMAZEPINE 100 MG PO CHEW
100.0000 mg | CHEWABLE_TABLET | Freq: Three times a day (TID) | ORAL | Status: DC
  Administered 2020-05-10 (×3): 100 mg via ORAL
  Filled 2020-05-10 (×4): qty 1

## 2020-05-10 MED ORDER — SODIUM CHLORIDE 0.9% FLUSH
10.0000 mL | Freq: Two times a day (BID) | INTRAVENOUS | Status: DC
  Administered 2020-05-10: 10 mL

## 2020-05-10 MED ORDER — PANTOPRAZOLE SODIUM 40 MG IV SOLR
40.0000 mg | Freq: Every day | INTRAVENOUS | Status: DC
  Administered 2020-05-10 (×2): 40 mg via INTRAVENOUS
  Filled 2020-05-10 (×2): qty 40

## 2020-05-10 MED ORDER — ACETAMINOPHEN 325 MG PO TABS: 650.0000 mg | ORAL_TABLET | Freq: Four times a day (QID) | ORAL | Status: DC | PRN

## 2020-05-10 MED ORDER — SODIUM CHLORIDE 0.9% IV SOLUTION: Freq: Once | INTRAVENOUS | Status: AC

## 2020-05-10 MED ORDER — ACETAMINOPHEN 650 MG RE SUPP: 650.0000 mg | Freq: Four times a day (QID) | RECTAL | Status: DC | PRN

## 2020-05-10 MED ORDER — SODIUM CHLORIDE 0.9% FLUSH: 10.0000 mL | INTRAVENOUS | Status: DC | PRN

## 2020-05-10 MED ORDER — ARIPIPRAZOLE 5 MG PO TABS
15.0000 mg | ORAL_TABLET | Freq: Every day | ORAL | Status: DC
  Administered 2020-05-10: 15 mg via ORAL
  Filled 2020-05-10 (×2): qty 3

## 2020-05-10 MED ORDER — GABAPENTIN 300 MG PO CAPS
300.0000 mg | ORAL_CAPSULE | Freq: Three times a day (TID) | ORAL | Status: DC
  Administered 2020-05-10 (×3): 300 mg via ORAL
  Filled 2020-05-10 (×3): qty 1

## 2020-05-10 MED ORDER — SODIUM CHLORIDE 0.9% FLUSH
3.0000 mL | Freq: Two times a day (BID) | INTRAVENOUS | Status: DC
  Administered 2020-05-10: 3 mL via INTRAVENOUS

## 2020-05-10 NOTE — Progress Notes (Signed)
Patient is admitted for symptomatic anemia. Transfusion 2 units PRBC done. She was seen and examined this am.. Awaiting h and h then hopefully discharge. She will need to follow up with GI as outpatient. No signs of acute bleeding. Discussed with GI team whi will arrange for follow up. Full note pending.

## 2020-05-10 NOTE — Progress Notes (Signed)
PROGRESS NOTE    Danielle Prince  JIR:[id number] DOB: 1996-09-21 DOA: 05/09/2020 PCP: System, Pcp Not In   Chef Complaints: Fatigue  Brief Narrative: 57 female with history of bipolar disorder methamphetamine use history of esophagitis hemorrhoids presented to the ED for low hemoglobin found on recent blood work outpatient.  She was admitted.  Subjective: Patient was seen in the morning doing well had finished 1 unit PRBC transfusion and hemoglobin is still low and ordered for additional 1 unit PRBC.   Assessment & Plan:  Symptomatic anemia: Transfusing 2nd unit of PRBC and  repeat H&H 12 posttransfusion if remains a stable okay for discharge.  Discussed with GI will arrange for outpatient follow-up no indication for acute intervention especially endoscopy given no active signs of bleeding, Hemoccult negative.  Bipolar depression manic phase: Mood is stable.  DVT prophylaxis:SCD Code Status:full  Family Communication: plan of care discussed with patient at bedside.  Status is: Observation  The patient remains OBS appropriate and will d/c before 2 midnights.  Dispo: The patient is from: Home              Anticipated d/c is to: Home              Anticipated d/c date is: 1 day              Patient currently is not medically stable to d/c.  Diet Order            Diet regular Room service appropriate? Yes; Fluid consistency: Thin  Diet effective now        Diet - low sodium heart healthy              Consultants:see note  Procedures:see note Microbiology:see note  Medications: Scheduled Meds: . ARIPiprazole  15 mg Oral Daily  . carbamazepine  100 mg Oral TID  . gabapentin  300 mg Oral TID  . nicotine  7 mg Transdermal Daily  . pantoprazole (PROTONIX) IV  40 mg Intravenous QHS  . sodium chloride flush  10-40 mL Intracatheter Q12H  . sodium chloride flush  3 mL Intravenous Q12H   Continuous Infusions:  Antimicrobials: Anti-infectives (From admission, onward)   None       Objective: Vitals: Today's Vitals   05/10/20 2120 05/11/20 0127 05/11/20 0515 05/11/20 0749  BP: 123/73  (!) 112/59   Pulse: 100  72   Resp:   16   Temp: 97.8 F (36.6 C)  98.7 F (37.1 C)   TempSrc: Oral  Oral   SpO2: 99%  98%   Weight:      Height:      PainSc:  0-No pain  0-No pain    Intake/Output Summary (Last 24 hours) at 05/11/2020 0758 Last data filed at 05/10/2020 1753 Gross per 24 hour  Intake 1160 ml  Output -  Net 1160 ml   Filed Weights   05/09/20 1800  Weight: 81.6 kg   Weight change:    Intake/Output from previous day: 05/22 0701 - 05/23 0700 In: 1160 [P.O.:820; Blood:340] Out: -  Intake/Output this shift: No intake/output data recorded.  Examination:  General exam: AAOx3 ,NAD, weak appearing. HEENT:Oral mucosa moist, Ear/Nose WNL grossly,dentition normal. Respiratory system: bilaterally clear,no wheezing or crackles,no use of accessory muscle, non tender. Cardiovascular system: S1 & S2 +, regular, No JVD. Gastrointestinal system: Abdomen soft, NT,ND, BS+. Nervous System:Alert, awake, moving extremities and grossly nonfocal Extremities: No edema, distal peripheral pulses palpable.  Skin: No rashes,no icterus. MSK: Normal  muscle bulk,tone, power  Data Reviewed: I have personally reviewed following labs and imaging studies CBC: Recent Labs  Lab 05/09/20 2032 05/10/20 0630 05/10/20 1706  WBC 6.7  --   --   HGB 5.8* 6.5* 8.3*  HCT 21.5* 23.1* 28.0*  MCV 64.4*  --   --   PLT 534*  --   --    Basic Metabolic Panel: Recent Labs  Lab 05/09/20 2130 05/10/20 0630  NA 140 139  K 3.4* 3.7  CL 106 109  CO2 25 27  GLUCOSE 88 102*  BUN 12 10  CREATININE 1.02* 0.87  CALCIUM 8.6* 8.4*  MG  --  1.9   GFR: Estimated Creatinine Clearance: 106.9 mL/min (by C-G formula based on SCr of 0.87 mg/dL). Liver Function Tests: Recent Labs  Lab 05/09/20 2130  AST 18  ALT 16  ALKPHOS 54  BILITOT 0.4  PROT 6.3*  ALBUMIN 3.4*   No  results for input(s): LIPASE, AMYLASE in the last 168 hours. No results for input(s): AMMONIA in the last 168 hours. Coagulation Profile: No results for input(s): INR, PROTIME in the last 168 hours. Cardiac Enzymes: No results for input(s): CKTOTAL, CKMB, CKMBINDEX, TROPONINI in the last 168 hours. BNP (last 3 results) No results for input(s): PROBNP in the last 8760 hours. HbA1C: No results for input(s): HGBA1C in the last 72 hours. CBG: No results for input(s): GLUCAP in the last 168 hours. Lipid Profile: No results for input(s): CHOL, HDL, LDLCALC, TRIG, CHOLHDL, LDLDIRECT in the last 72 hours. Thyroid Function Tests: No results for input(s): TSH, T4TOTAL, FREET4, T3FREE, THYROIDAB in the last 72 hours. Anemia Panel: Recent Labs    05/09/20 2355  VITAMINB12 236  FOLATE 13.2  FERRITIN 10*  TIBC 417  IRON 18*  RETICCTPCT 1.6   Sepsis Labs: No results for input(s): PROCALCITON, LATICACIDVEN in the last 168 hours.  Recent Results (from the past 240 hour(s))  SARS Coronavirus 2 by RT PCR (hospital order, performed in [facility] hospital lab) Nasopharyngeal Nasopharyngeal Swab     Status: None   Collection Time: 05/09/20 10:23 PM   Specimen: Nasopharyngeal Swab  Result Value Ref Range Status   SARS Coronavirus 2 NEGATIVE NEGATIVE Final    Comment: (NOTE) SARS-CoV-2 target nucleic acids are NOT DETECTED. The SARS-CoV-2 RNA is generally detectable in upper and lower respiratory specimens during the acute phase of infection. The lowest concentration of SARS-CoV-2 viral copies this assay can detect is 250 copies / mL. A negative result does not preclude SARS-CoV-2 infection and should not be used as the sole basis for treatment or other patient management decisions.  A negative result may occur with improper specimen collection / handling, submission of specimen other than nasopharyngeal swab, presence of viral mutation(s) within the areas targeted by this assay, and  inadequate number of viral copies (<250 copies / mL). A negative result must be combined with clinical observations, patient history, and epidemiological information. Fact Sheet for Patients:   BoilerBrush.com.cy Fact Sheet for Healthcare Providers: https://pope.com/ This test is not yet approved or cleared  by the [place] FDA and has been authorized for detection and/or diagnosis of SARS-CoV-2 by FDA under an Emergency Use Authorization (EUA).  This EUA will remain in effect (meaning this test can be used) for the duration of the COVID-19 declaration under Section 564(b)(1) of the Act, 21 U.S.C. section 360bbb-3(b)(1), unless the authorization is terminated or revoked sooner. Performed at [facility], [address] [place], [place]  [place]       Radiology Studies: No results found.   LOS: 0 days   Antonieta Pert, MD [facility]  05/11/2020, 7:58 AM

## 2020-05-10 NOTE — ED Notes (Signed)
Attempted to call report. RN unavailable. Will try to call again in 10 minutes

## 2020-05-10 NOTE — Discharge Summary (Signed)
Physician Discharge Summary  Havah Ammon RJJ:[id number] DOB: 05-Sep-1996 DOA: 05/09/2020  PCP: System, Pcp Not In  Admit date: 05/09/2020 Discharge date: 05/11/2020  Admitted From: home Disposition:  home  Recommendations for Outpatient Follow-up:  1. Follow up with PCP in 1-2 weeks 2. Please follow-up with lumbar GI in 1 week 3. Please obtain BMP/CBC in one week 4. Please follow up on the following pending results:  Home Health:none  Equipment/Devices: none  Discharge Condition: Stable Code Status: full Diet recommendation:  Diet Order            Diet regular Room service appropriate? Yes; Fluid consistency: Thin  Diet effective now        Diet - low sodium heart healthy               Brief/Interim Summary: Old female with history of bipolar disorder, methamphetamine abuse history of esophagitis and hemorrhoids presenting to the ED with low hemoglobin, has been feeling fatigued and increased exertional dyspnea for more than 2 months and had blood work done a week ago and was instructed to seek medical care due to low hemoglobin but had an unexplained delay in coming in. Patient was seen hemoglobin was 5.8 g monitor PRBC was ordered and admitted overnight. Repeat hemoglobin still low at 6.5 g additional 1 unit ordered.  Has low iron storage iron 18, ferritin 10, B12 236 and folate normal. Patient will be discharged home once H&H improves post 2nd unit prbc. Her Hemoccult has been negative and no obvious signs of bleeding ID consulted discussed with [facility] and they will arrange for outpatient follow-up since no plan for urgent endoscopy over this weekend and they will arrange outpatient if necessary.  Discharge Diagnoses:   Admitting anemia:  status post 2 PRBC  Transfusion. H&H did not came until late after 11 PM and it was 8.3 g.  Patient stayed overnight and is being discharged this morning.  she instructed to have outpatient follow-up CBC in 1 week. Hemoccult was  negative.  Discussed with her GI team did not advise any urgent procedures and okay for discharge and they will arrange for outpatient follow-up.  We will add B12 supplementation.  Patient endorses allergic reaction with IV iron.  Continue iron rich food.  Bipolar disorder/bulimia : Mood is stable, continue her home meds.  Substance abuse hx   Consults:  GI  Subjective: Seen comfortably no nausea vomiting.  No abdominal pain. Discharge Exam: Vitals:   05/10/20 2120 05/11/20 0515  BP: 123/73 (!) 112/59  Pulse: 100 72  Resp:  16  Temp: 97.8 F (36.6 C) 98.7 F (37.1 C)  SpO2: 99% 98%   General: Pt is alert, awake, not in acute distress Cardiovascular: RRR, S1/S2 +, no rubs, no gallops Respiratory: CTA bilaterally, no wheezing, no rhonchi Abdominal: Soft, NT, ND, bowel sounds + Extremities: no edema, no cyanosis  Discharge Instructions  Discharge Instructions    Diet - low sodium heart healthy   Complete by: As directed    Discharge instructions   Complete by: As directed    Please call the lumbar GI for follow-up if you do not hear from them in the next 2 to 3 days.  Please check CBC in 5 to 7 days and discuss WITH your PCP about iron supplementation  Please call call MD or return to ER for similar or worsening recurring problem that brought you to hospital or if any fever,nausea/vomiting,abdominal pain, uncontrolled pain, chest pain,  shortness of breath  or any other alarming symptoms.  Please follow-up your doctor as instructed in a week time and call the office for appointment.  Please avoid alcohol, smoking, or any other illicit substance and maintain healthy habits including taking your regular medications as prescribed.  You were cared for by a hospitalist during your hospital stay. If you have any questions about your discharge medications or the care you received while you were in the hospital after you are discharged, you can call the unit and ask to speak with the  hospitalist on call if the hospitalist that took care of you is not available.  Once you are discharged, your primary care physician will handle any further medical issues. Please note that NO REFILLS for any discharge medications will be authorized once you are discharged, as it is imperative that you return to your primary care physician (or establish a relationship with a primary care physician if you do not have one) for your aftercare needs so that they can reassess your need for medications and monitor your lab values   Increase activity slowly   Complete by: As directed      Allergies as of 05/11/2020      Reactions   Iron    Iron infusion      Medication List    TAKE these medications   ARIPiprazole 15 MG tablet Commonly known as: ABILIFY Take 15 mg by mouth daily.   carbamazepine 100 MG chewable tablet Commonly known as: TEGRETOL Chew 1 tablet (100 mg total) by mouth 3 (three) times daily.   gabapentin 300 MG capsule Commonly known as: NEURONTIN Take 1 capsule (300 mg total) by mouth 3 (three) times daily.   lidocaine 2 % solution Commonly known as: XYLOCAINE Use as directed 15 mLs in the mouth or throat every 6 (six) hours as needed for mouth pain.   medroxyPROGESTERone 150 MG/ML injection Commonly known as: DEPO-PROVERA Inject 150 mg into the muscle every 3 (three) months.   omeprazole 20 MG capsule Commonly known as: PRILOSEC Take 20 mg by mouth daily.   sulfamethoxazole-trimethoprim 800-160 MG tablet Commonly known as: BACTRIM DS Take 1 tablet by mouth 2 (two) times daily.   vitamin B-12 500 MCG tablet Commonly known as: CYANOCOBALAMIN Take 1 tablet (500 mcg total) by mouth daily.      Follow-up Information    Pyrtle, Lajuan Lines, MD. Call in 3 day(s).   Specialty: Gastroenterology Why: If you do not get a call from the office for follow-up Contact information: [address] [place] [address] [phone]        [facility] Follow up in 1 week(s).   Why: For CBC check if unable to see your own PCP in 1 week for CBC check Contact information: [place] [address] [phone]         Allergies  Allergen Reactions  . Iron     Iron infusion    The results of significant diagnostics from this hospitalization (including imaging, microbiology, ancillary and laboratory) are listed below for reference.    Microbiology: Recent Results (from the past 240 hour(s))  SARS Coronavirus 2 by RT PCR (hospital order, performed in [facility] hospital lab) Nasopharyngeal Nasopharyngeal Swab     Status: None   Collection Time: 05/09/20 10:23 PM   Specimen: Nasopharyngeal Swab  Result Value Ref Range Status   SARS Coronavirus 2 NEGATIVE NEGATIVE Final    Comment: (NOTE) SARS-CoV-2 target nucleic  acids are NOT DETECTED. The SARS-CoV-2 RNA is generally detectable in upper and lower respiratory specimens during the acute phase of infection. The lowest concentration of SARS-CoV-2 viral copies this assay can detect is 250 copies / mL. A negative result does not preclude SARS-CoV-2 infection and should not be used as the sole basis for treatment or other patient management decisions.  A negative result may occur with improper specimen collection / handling, submission of specimen other than nasopharyngeal swab, presence of viral mutation(s) within the areas targeted by this assay, and inadequate number of viral copies (<250 copies / mL). A negative result must be combined with clinical observations, patient history, and epidemiological information. Fact Sheet for Patients:   BoilerBrush.com.cy Fact Sheet for Healthcare Providers: https://pope.com/ This test is not yet approved or cleared  by the [place] FDA and has been authorized for detection and/or diagnosis of SARS-CoV-2 by FDA under an Emergency Use Authorization  (EUA).  This EUA will remain in effect (meaning this test can be used) for the duration of the COVID-19 declaration under Section 564(b)(1) of the Act, 21 U.S.C. section 360bbb-3(b)(1), unless the authorization is terminated or revoked sooner. Performed at [facility], [address], [place], [place] [address]     Procedures/Studies: No results found.  Labs: BNP (last 3 results) No results for input(s): BNP in the last 8760 hours. Basic Metabolic Panel: Recent Labs  Lab 05/09/20 2130 05/10/20 0630  NA 140 139  K 3.4* 3.7  CL 106 109  CO2 25 27  GLUCOSE 88 102*  BUN 12 10  CREATININE 1.02* 0.87  CALCIUM 8.6* 8.4*  MG  --  1.9   Liver Function Tests: Recent Labs  Lab 05/09/20 2130  AST 18  ALT 16  ALKPHOS 54  BILITOT 0.4  PROT 6.3*  ALBUMIN 3.4*   No results for input(s): LIPASE, AMYLASE in the last 168 hours. No results for input(s): AMMONIA in the last 168 hours. CBC: Recent Labs  Lab 05/09/20 2032 05/10/20 0630 05/10/20 1706  WBC 6.7  --   --   HGB 5.8* 6.5* 8.3*  HCT 21.5* 23.1* 28.0*  MCV 64.4*  --   --   PLT 534*  --   --    Cardiac Enzymes: No results for input(s): CKTOTAL, CKMB, CKMBINDEX, TROPONINI in the last 168 hours. BNP: Invalid input(s): POCBNP CBG: No results for input(s): GLUCAP in the last 168 hours. D-Dimer No results for input(s): DDIMER in the last 72 hours. Hgb A1c No results for input(s): HGBA1C in the last 72 hours. Lipid Profile No results for input(s): CHOL, HDL, LDLCALC, TRIG, CHOLHDL, LDLDIRECT in the last 72 hours. Thyroid function studies No results for input(s): TSH, T4TOTAL, T3FREE, THYROIDAB in the last 72 hours.  Invalid input(s): FREET3 Anemia work up Recent Labs    05/09/20 2355  VITAMINB12 236  FOLATE 13.2  FERRITIN 10*  TIBC 417  IRON 18*  RETICCTPCT 1.6   Urinalysis    Component Value Date/Time   COLORURINE YELLOW 03/01/2018 1130   APPEARANCEUR HAZY (A) 03/01/2018 1130    LABSPEC 1.016 03/01/2018 1130   PHURINE 7.0 03/01/2018 1130   GLUCOSEU NEGATIVE 03/01/2018 1130   HGBUR NEGATIVE 03/01/2018 1130   BILIRUBINUR NEGATIVE 03/01/2018 1130   BILIRUBINUR negative 01/17/2015 1233   KETONESUR NEGATIVE 03/01/2018 1130   PROTEINUR NEGATIVE 03/01/2018 1130   UROBILINOGEN negative 01/17/2015 1233   UROBILINOGEN 0.2 05/28/2012 1802   NITRITE NEGATIVE 03/01/2018 1130   LEUKOCYTESUR MODERATE (A)  03/01/2018 1130   Sepsis Labs Invalid input(s): PROCALCITONIN,  WBC,  LACTICIDVEN Microbiology Recent Results (from the past 240 hour(s))  SARS Coronavirus 2 by RT PCR (hospital order, performed in [facility] hospital lab) Nasopharyngeal Nasopharyngeal Swab     Status: None   Collection Time: 05/09/20 10:23 PM   Specimen: Nasopharyngeal Swab  Result Value Ref Range Status   SARS Coronavirus 2 NEGATIVE NEGATIVE Final    Comment: (NOTE) SARS-CoV-2 target nucleic acids are NOT DETECTED. The SARS-CoV-2 RNA is generally detectable in upper and lower respiratory specimens during the acute phase of infection. The lowest concentration of SARS-CoV-2 viral copies this assay can detect is 250 copies / mL. A negative result does not preclude SARS-CoV-2 infection and should not be used as the sole basis for treatment or other patient management decisions.  A negative result may occur with improper specimen collection / handling, submission of specimen other than nasopharyngeal swab, presence of viral mutation(s) within the areas targeted by this assay, and inadequate number of viral copies (<250 copies / mL). A negative result must be combined with clinical observations, patient history, and epidemiological information. Fact Sheet for Patients:   BoilerBrush.com.cy Fact Sheet for Healthcare Providers: https://pope.com/ This test is not yet approved or cleared  by the [place] FDA and has been authorized for detection and/or  diagnosis of SARS-CoV-2 by FDA under an Emergency Use Authorization (EUA).  This EUA will remain in effect (meaning this test can be used) for the duration of the COVID-19 declaration under Section 564(b)(1) of the Act, 21 U.S.C. section 360bbb-3(b)(1), unless the authorization is terminated or revoked sooner. Performed at [facility], [address] [place], [place] [address]      Time coordinating discharge: 25 minutes  SIGNED: Lanae Boast, MD  [facility] 05/11/2020, 12:20 PM  If 7PM-7AM, please contact night-coverage [email or web address]

## 2020-05-10 NOTE — H&P (Signed)
History and Physical    Danielle Prince DTO:[id number] DOB: January 18, 1996 DOA: 05/09/2020  PCP: System, Pcp Not In   Patient coming from: Home   Chief Complaint: Low Hgb, fatigue, DOE   HPI: Danielle Prince is a 24 y.o. female with medical history significant for bipolar disorder, methamphetamine abuse, history of esophagitis, and hemorrhoids, now presenting to the emergency department for evaluation of low hemoglobin.  Patient reports that she has been more fatigued and with increased exertional dyspnea for more than 2 months now, had blood work performed roughly a week ago and was instructed to seek evaluation of low hemoglobin in the emergency department but had an unexplained delay in coming in.  She denies any abdominal pain, denies any melena or hematochezia, denies hematemesis, and reports that she does not menstruate.  She denies any near-syncope.  She reports that she required blood transfusion several years ago and underwent inpatient upper endoscopy and colonoscopy at that time that was notable for esophagitis and small external hemorrhoids.  She was taking PPI twice daily for a while following that admission and continues to use omeprazole once daily.  ED Course: Upon arrival to the ED, patient is found to be afebrile, saturating well on room air, and with stable blood pressure.  Chemistry panel notable for creatinine 1.02 and potassium 3.4.  CBC features a microcytic anemia with hemoglobin 5.8, down from 7.1 a month ago.  Fecal occult blood testing was negative and COVID-19 screening test also negative.  Anemia panel was sent and 1 unit of packed RBC was ordered for transfusion.  Review of Systems:  All other systems reviewed and apart from HPI, are negative.  Past Medical History:  Diagnosis Date  . ADHD (attention deficit hyperactivity disorder)   . ADHD (attention deficit hyperactivity disorder)   . Anorexia nervosa with bulimia   . Anxiety   . Aspirin overdose 02/2012  .  Depression   . Dysrhythmia   . Eating disorder   . Electrolyte disturbance 2010, 2012   hypokalemia, hypochloremia.  . Urinary tract infection   . Varicella   . Vision abnormalities     Past Surgical History:  Procedure Laterality Date  . COLONOSCOPY WITH ESOPHAGOGASTRODUODENOSCOPY (EGD) N/A 10/28/2013   Procedure: COLONOSCOPY WITH ESOPHAGOGASTRODUODENOSCOPY (EGD);  Surgeon: Jerene Bears, MD;  [facility] END[facility];  Service: Endoscopy;  Laterality: N/A;  . GUM SURGERY    . INDUCED ABORTION       reports that she has been smoking cigarettes. She has been smoking about 0.50 packs per day. She has never used smokeless tobacco. She reports previous alcohol use. She reports current drug use. Drugs: Methamphetamines and IV.  Allergies  Allergen Reactions  . Iron     Iron infusion    Family History  Problem Relation Age of Onset  . Cancer Father   . Depression Sister   . Depression Paternal Aunt   . Drug abuse Maternal Uncle        deceased  . Drug abuse Paternal Aunt   . Colon cancer Maternal Grandmother      Prior to Admission medications   Medication Sig Start Date End Date Taking? Authorizing Provider  ARIPiprazole (ABILIFY) 15 MG tablet Take 15 mg by mouth daily.   Yes [provider]  carbamazepine (TEGRETOL) 100 MG chewable tablet Chew 1 tablet (100 mg total) by mouth 3 (three) times daily. 11/22/19  Yes Johnn Hai, MD  gabapentin (NEURONTIN) 300 MG capsule Take 1 capsule (300 mg total) by mouth  3 (three) times daily. 11/22/19  Yes Malvin Johns, MD  lidocaine (XYLOCAINE) 2 % solution Use as directed 15 mLs in the mouth or throat every 6 (six) hours as needed for mouth pain.  04/22/20  Yes [provider]  medroxyPROGESTERone (DEPO-PROVERA) 150 MG/ML injection Inject 150 mg into the muscle every 3 (three) months.  04/28/20  Yes [provider]  omeprazole (PRILOSEC) 20 MG capsule Take 20 mg by mouth daily. 04/28/20  Yes [provider]    sulfamethoxazole-trimethoprim (BACTRIM DS) 800-160 MG tablet Take 1 tablet by mouth 2 (two) times daily. 04/28/20  Yes [provider]    Physical Exam: Vitals:   05/09/20 2142 05/09/20 2316 05/09/20 2330 05/10/20 0058  BP: (!) 102/46 (!) 114/53 (!) 113/56 123/60  Pulse: (!) 103 87 92 95  Resp: 18 18  20   Temp:    98.7 F (37.1 C)  TempSrc:    Oral  SpO2: 100% 98% 98% 99%  Weight:      Height:        Constitutional: NAD, calm  Eyes: PERTLA, lids and conjunctivae normal ENMT: Mucous membranes are moist. Posterior pharynx clear of any exudate or lesions.   Neck: normal, supple, no masses, no thyromegaly Respiratory: no wheezing, no crackles. No accessory muscle use.  Cardiovascular: S1 & S2 heard, regular rate and rhythm. No extremity edema.   Abdomen: No distension, no tenderness, soft. Bowel sounds active.  Musculoskeletal: no clubbing / cyanosis. No joint deformity upper and lower extremities.   Skin: no significant rashes, lesions, ulcers. Warm, dry, well-perfused. Neurologic: no facial asymmetry. Sensation intact. Moving all extremities.  Psychiatric: Alert and oriented to person, place, and situation. Calm and cooperative.    Labs and Imaging on Admission: I have personally reviewed following labs and imaging studies  CBC: Recent Labs  Lab 05/09/20 2032  WBC 6.7  HGB 5.8*  HCT 21.5*  MCV 64.4*  PLT 534*   Basic Metabolic Panel: Recent Labs  Lab 05/09/20 2130  NA 140  K 3.4*  CL 106  CO2 25  GLUCOSE 88  BUN 12  CREATININE 1.02*  CALCIUM 8.6*   GFR: Estimated Creatinine Clearance: 91.2 mL/min (A) (by C-G formula based on SCr of 1.02 mg/dL (H)). Liver Function Tests: Recent Labs  Lab 05/09/20 2130  AST 18  ALT 16  ALKPHOS 54  BILITOT 0.4  PROT 6.3*  ALBUMIN 3.4*   No results for input(s): LIPASE, AMYLASE in the last 168 hours. No results for input(s): AMMONIA in the last 168 hours. Coagulation Profile: No results for input(s): INR,  PROTIME in the last 168 hours. Cardiac Enzymes: No results for input(s): CKTOTAL, CKMB, CKMBINDEX, TROPONINI in the last 168 hours. BNP (last 3 results) No results for input(s): PROBNP in the last 8760 hours. HbA1C: No results for input(s): HGBA1C in the last 72 hours. CBG: No results for input(s): GLUCAP in the last 168 hours. Lipid Profile: No results for input(s): CHOL, HDL, LDLCALC, TRIG, CHOLHDL, LDLDIRECT in the last 72 hours. Thyroid Function Tests: No results for input(s): TSH, T4TOTAL, FREET4, T3FREE, THYROIDAB in the last 72 hours. Anemia Panel: Recent Labs    05/09/20 2355  RETICCTPCT 1.6   Urine analysis:    Component Value Date/Time   COLORURINE YELLOW 03/01/2018 1130   APPEARANCEUR HAZY (A) 03/01/2018 1130   LABSPEC 1.016 03/01/2018 1130   PHURINE 7.0 03/01/2018 1130   GLUCOSEU NEGATIVE 03/01/2018 1130   HGBUR NEGATIVE 03/01/2018 1130   BILIRUBINUR NEGATIVE 03/01/2018 1130  BILIRUBINUR negative 01/17/2015 1233   KETONESUR NEGATIVE 03/01/2018 1130   PROTEINUR NEGATIVE 03/01/2018 1130   UROBILINOGEN negative 01/17/2015 1233   UROBILINOGEN 0.2 05/28/2012 1802   NITRITE NEGATIVE 03/01/2018 1130   LEUKOCYTESUR MODERATE (A) 03/01/2018 1130   Sepsis Labs: @LABRCNTIP (procalcitonin:4,lacticidven:4) ) Recent Results (from the past 240 hour(s))  SARS Coronavirus 2 by RT PCR (hospital order, performed in [facility] hospital lab) Nasopharyngeal Nasopharyngeal Swab     Status: None   Collection Time: 05/09/20 10:23 PM   Specimen: Nasopharyngeal Swab  Result Value Ref Range Status   SARS Coronavirus 2 NEGATIVE NEGATIVE Final    Comment: (NOTE) SARS-CoV-2 target nucleic acids are NOT DETECTED. The SARS-CoV-2 RNA is generally detectable in upper and lower respiratory specimens during the acute phase of infection. The lowest concentration of SARS-CoV-2 viral copies this assay can detect is 250 copies / mL. A negative result does not preclude SARS-CoV-2 infection and  should not be used as the sole basis for treatment or other patient management decisions.  A negative result may occur with improper specimen collection / handling, submission of specimen other than nasopharyngeal swab, presence of viral mutation(s) within the areas targeted by this assay, and inadequate number of viral copies (<250 copies / mL). A negative result must be combined with clinical observations, patient history, and epidemiological information. Fact Sheet for Patients:   05/11/20 Fact Sheet for Healthcare Providers: BoilerBrush.com.cy This test is not yet approved or cleared  by the https://pope.com/ FDA and has been authorized for detection and/or diagnosis of SARS-CoV-2 by FDA under an Emergency Use Authorization (EUA).  This EUA will remain in effect (meaning this test can be used) for the duration of the COVID-19 declaration under Section 564(b)(1) of the Act, 21 U.S.C. section 360bbb-3(b)(1), unless the authorization is terminated or revoked sooner. Performed at [facility], [address], [place], [place]      Radiological Exams on Admission: No results found.  Assessment/Plan   1. Symptomatic anemia  - Presents for evaluation of low Hgb on outpatient labs, reports several months of increased DOE and fatigue, is found to have Hgb of 5.8, down from 7.1 a month ago  - She is hemodynamically stable  - She reports history of esophagitis and hemorrhoids but has not noticed any melena, hematochezia, or blood in vomitus, denies abdominal pain, does not menstruate, and she is FOBT-negative in ED  - Anemia panel is pending and 1 unit RBC was ordered from ED  - Check post-transfusion H&H, continue daily PPI    2. Bipolar disorder  - Appears to be stable with no SI, HI, or hallucinations on admission  - Continue carbamazepine and Abilify   3. Substance abuse  - Patient has history of  methamphetamine abuse, reports abstinence since [facility] admission last month and was encouraged to continue avoiding illicit substances    DVT prophylaxis: SCDs  Code Status: Full  Family Communication: Mother updated at bedside with patient's permission  Disposition Plan:  Patient is from: Home  Anticipated d/c is to: Home  Anticipated d/c date is: 05/10/20 Patient currently: Pending blood transfusion  Consults called: None  Admission status: Observation     05/12/20, MD [facility] Pager: See [email or web address]  If 7AM-7PM, please contact the daytime attending [email or web address]  05/10/2020, 1:01 AM

## 2020-05-10 NOTE — Progress Notes (Signed)
Writer called lab to follow up with Patients H & H. Results still pending at this time.

## 2020-05-11 DIAGNOSIS — D649 Anemia, unspecified: Secondary | ICD-10-CM | POA: Diagnosis not present

## 2020-05-11 NOTE — Progress Notes (Signed)
Discharge medications and instructions reviewed. No questions or concerns voiced at this time. Vital signs stable. Waiting for patient's mother to arrive to facility for transportation home.

## 2020-05-12 ENCOUNTER — Other Ambulatory Visit: Payer: Self-pay

## 2020-05-12 ENCOUNTER — Telehealth: Payer: Self-pay

## 2020-05-12 LAB — TYPE AND SCREEN
ABO/RH(D): O NEG
Antibody Screen: NEGATIVE
Unit division: 0
Unit division: 0

## 2020-05-12 LAB — BPAM RBC
Blood Product Expiration Date: 202106062359
Blood Product Expiration Date: 202106082359
ISSUE DATE / TIME: 202105220111
ISSUE DATE / TIME: 202105221007
Unit Type and Rh: 9500
Unit Type and Rh: 9500

## 2020-05-12 NOTE — Telephone Encounter (Signed)
-----   Message from Meredith Pel, NP sent at 05/10/2020 10:16 AM EDT ----- [name], please make this patient an appointment to be seen in the office in a couple of weeks.  She belongs to [facility] but I doubt he has anything open.  Patient in the hospital now with anemia.  We did not feel the need to see her in consultation as work-up can be done as an outpatient so I told hospitalist we would get her an appointment.  Please put her in with one of the apps and 2 to 3 weeks for evaluation of recurrent anemia.  I do not think she has been seen in a few years so it does not matter which 1 of [place] u put her with  Thanks

## 2020-05-12 NOTE — Telephone Encounter (Signed)
Letter mailed with appointment information

## 2020-06-04 ENCOUNTER — Ambulatory Visit: Payer: 59 | Admitting: Nurse Practitioner

## 2020-06-06 ENCOUNTER — Emergency Department (HOSPITAL_COMMUNITY)
Admission: EM | Admit: 2020-06-06 | Discharge: 2020-06-07 | Discharge status: Against Medical Advice | Payer: No Typology Code available for payment source

## 2020-06-06 ENCOUNTER — Other Ambulatory Visit: Payer: Self-pay

## 2020-06-06 ENCOUNTER — Encounter (HOSPITAL_COMMUNITY): Payer: Self-pay

## 2020-06-06 NOTE — ED Triage Notes (Signed)
Pt report that she has been feeling more tired over the past few weeks. She would like some blood work done to check her hemoglobin. A&Ox4. Ambulatory.

## 2020-06-07 NOTE — ED Notes (Signed)
unsuccessful with labs PT will wait for IV

## 2020-06-07 NOTE — ED Notes (Signed)
Pt also requesting HIV and hepatitis panels. Extra Gold tubes drawn

## 2021-06-12 ENCOUNTER — Emergency Department (HOSPITAL_BASED_OUTPATIENT_CLINIC_OR_DEPARTMENT_OTHER)
Admission: EM | Admit: 2021-06-12 | Discharge: 2021-06-12 | Disposition: A | Discharge status: Home / Self Care | Payer: BLUE CROSS/BLUE SHIELD | Attending: Emergency Medicine | Admitting: Emergency Medicine

## 2021-06-12 ENCOUNTER — Encounter (HOSPITAL_BASED_OUTPATIENT_CLINIC_OR_DEPARTMENT_OTHER): Payer: Self-pay

## 2021-06-12 ENCOUNTER — Other Ambulatory Visit: Payer: Self-pay

## 2021-06-12 DIAGNOSIS — R5383 Other fatigue: Secondary | ICD-10-CM | POA: Diagnosis not present

## 2021-06-12 DIAGNOSIS — R110 Nausea: Secondary | ICD-10-CM | POA: Diagnosis not present

## 2021-06-12 DIAGNOSIS — K920 Hematemesis: Secondary | ICD-10-CM | POA: Insufficient documentation

## 2021-06-12 DIAGNOSIS — F17210 Nicotine dependence, cigarettes, uncomplicated: Secondary | ICD-10-CM | POA: Diagnosis not present

## 2021-06-12 LAB — CBC WITH DIFFERENTIAL/PLATELET
Abs Immature Granulocytes: 0.01 10*3/uL (ref 0.00–0.07)
Basophils Absolute: 0.1 10*3/uL (ref 0.0–0.1)
Basophils Relative: 1 %
Eosinophils Absolute: 0.1 10*3/uL (ref 0.0–0.5)
Eosinophils Relative: 1 %
HCT: 24 % — ABNORMAL LOW (ref 36.0–46.0)
Hemoglobin: 7 g/dL — ABNORMAL LOW (ref 12.0–15.0)
Immature Granulocytes: 0 %
Lymphocytes Relative: 23 %
Lymphs Abs: 1.7 10*3/uL (ref 0.7–4.0)
MCH: 19.3 pg — ABNORMAL LOW (ref 26.0–34.0)
MCHC: 29.2 g/dL — ABNORMAL LOW (ref 30.0–36.0)
MCV: 66.3 fL — ABNORMAL LOW (ref 80.0–100.0)
Monocytes Absolute: 0.4 10*3/uL (ref 0.1–1.0)
Monocytes Relative: 6 %
Neutro Abs: 4.9 10*3/uL (ref 1.7–7.7)
Neutrophils Relative %: 69 %
Platelets: 497 10*3/uL — ABNORMAL HIGH (ref 150–400)
RBC: 3.62 MIL/uL — ABNORMAL LOW (ref 3.87–5.11)
RDW: 15.9 % — ABNORMAL HIGH (ref 11.5–15.5)
WBC: 7.2 10*3/uL (ref 4.0–10.5)
nRBC: 0 % (ref 0.0–0.2)

## 2021-06-12 LAB — COMPREHENSIVE METABOLIC PANEL
ALT: 15 U/L (ref 0–44)
AST: 14 U/L — ABNORMAL LOW (ref 15–41)
Albumin: 3.7 g/dL (ref 3.5–5.0)
Alkaline Phosphatase: 67 U/L (ref 38–126)
Anion gap: 10 (ref 5–15)
BUN: 15 mg/dL (ref 6–20)
CO2: 23 mmol/L (ref 22–32)
Calcium: 8.6 mg/dL — ABNORMAL LOW (ref 8.9–10.3)
Chloride: 106 mmol/L (ref 98–111)
Creatinine, Ser: 0.85 mg/dL (ref 0.44–1.00)
GFR, Estimated: >60 mL/min (ref >60)
Glucose, Bld: 112 mg/dL — ABNORMAL HIGH (ref 70–99)
Potassium: 3.5 mmol/L (ref 3.5–5.1)
Sodium: 139 mmol/L (ref 135–145)
Total Bilirubin: 0.2 mg/dL — ABNORMAL LOW (ref 0.3–1.2)
Total Protein: 6.1 g/dL — ABNORMAL LOW (ref 6.5–8.1)

## 2021-06-12 LAB — LIPASE, BLOOD: Lipase: 38 U/L (ref 11–51)

## 2021-06-12 MED ORDER — PANTOPRAZOLE SODIUM 40 MG IV SOLR
40.0000 mg | Freq: Once | INTRAVENOUS | Status: AC
  Administered 2021-06-12: 40 mg via INTRAVENOUS
  Filled 2021-06-12: qty 40

## 2021-06-12 MED ORDER — OMEPRAZOLE 20 MG PO CPDR
20.0000 mg | DELAYED_RELEASE_CAPSULE | Freq: Two times a day (BID) | ORAL | 2 refills | Status: DC
Start: 2021-06-12 — End: 2022-02-12

## 2021-06-12 MED ORDER — SUCRALFATE 1 GM/10ML PO SUSP: 1.0000 g | Freq: Three times a day (TID) | ORAL | 0 refills | Status: DC

## 2021-06-12 MED ORDER — LORAZEPAM 2 MG/ML IJ SOLN
1.0000 mg | Freq: Once | INTRAMUSCULAR | Status: AC
  Administered 2021-06-12: 1 mg via INTRAVENOUS
  Filled 2021-06-12: qty 1

## 2021-06-12 MED ORDER — LACTATED RINGERS IV BOLUS
1000.0000 mL | Freq: Once | INTRAVENOUS | Status: AC
  Administered 2021-06-12: 1000 mL via INTRAVENOUS

## 2021-06-12 MED ORDER — FAMOTIDINE 20 MG PO TABS: 20.0000 mg | ORAL_TABLET | Freq: Two times a day (BID) | ORAL | 0 refills | Status: DC

## 2021-06-12 NOTE — ED Provider Notes (Signed)
[facility] EMERGENCY DEPT Provider Note   CSN: [id number] Arrival date & time: 06/12/21  0416     History Chief Complaint  Patient presents with   Emesis   Fatigue    Danielle Prince is a 25 y.o. female.  2 episodes of bloody emesis earlier in the night. Similar to when she needed a transfusion previously. Known esophagitis. Also has h/o  IVDA. Actively using meth/crack/coke. Also h/o bulimia but mostly stable at this time.    Emesis Severity:  Mild Duration:  1 day Timing:  Intermittent Number of daily episodes:  2 Quality:  Stomach contents and bright red blood Able to tolerate:  Liquids and solids Progression:  Unchanged Chronicity:  Recurrent Recent urination:  Normal Relieved by:  None tried Worsened by:  Nothing Ineffective treatments:  None tried Associated symptoms: no abdominal pain, no sore throat and no URI       Past Medical History:  Diagnosis Date   ADHD (attention deficit hyperactivity disorder)    ADHD (attention deficit hyperactivity disorder)    Anorexia nervosa with bulimia    Anxiety    Aspirin overdose 02/2012   Depression    Dysrhythmia    Eating disorder    Electrolyte disturbance 2010, 2012   hypokalemia, hypochloremia.   Urinary tract infection    Varicella    Vision abnormalities     Patient Active Problem List   Diagnosis Date Noted   Symptomatic anemia 05/09/2020   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HCC)    Bipolar depression manic phase (HCC) 11/16/2019   Microcytic anemia 07/09/2019   Chest pain 07/03/2019   IV drug user 07/03/2019   Fatigue 07/03/2019   Depression    MDD (major depressive disorder), recurrent episode (HCC) 02/09/2017   Dyspareunia 04/15/2014   Esophagitis, acute 10/28/2013   Candidiasis of vulva and vagina 08/28/2013   Vaginitis and vulvovaginitis, unspecified 06/26/2013   Generalized anxiety disorder 03/02/2012   Oppositional defiant disorder 03/02/2012   Bulimia nervosa  02/26/2012   Severe episode of recurrent major depressive disorder, without psychotic features (HCC) 02/26/2012   Urinary tract infection    Vision abnormalities     Past Surgical History:  Procedure Laterality Date   COLONOSCOPY WITH ESOPHAGOGASTRODUODENOSCOPY (EGD) N/A 10/28/2013   Procedure: COLONOSCOPY WITH ESOPHAGOGASTRODUODENOSCOPY (EGD);  Surgeon: Beverley Fiedler, MD;  Location: [facility] ENDOSCOPY;  Service: Endoscopy;  Laterality: N/A;   GUM SURGERY     INDUCED ABORTION       OB History     Gravida  2   Para  0   Term  0   Preterm  0   AB  2   Living  0      SAB  0   IAB  2   Ectopic  0   Multiple  0   Live Births              Family History  Problem Relation Age of Onset   Cancer Father    Depression Sister    Depression Paternal Aunt    Drug abuse Maternal Uncle        deceased   Drug abuse Paternal Aunt    Colon cancer Maternal Grandmother     Social History   Tobacco Use   Smoking status: Some Days    Packs/day: 0.50    Pack years: 0.00    Types: Cigarettes   Smokeless tobacco: Never  Vaping Use   Vaping Use: Every day   Substances: Nicotine  Substance  Use Topics   Alcohol use: Not Currently    Alcohol/week: 0.0 standard drinks   Drug use: Yes    Types: Methamphetamines, IV    Comment: use $40 twice weekly    Home Medications Prior to Admission medications   Medication Sig Start Date End Date Taking? Authorizing Provider  famotidine (PEPCID) 20 MG tablet Take 1 tablet (20 mg total) by mouth 2 (two) times daily for 7 days. 06/12/21 06/19/21 Yes Taygen Acklin, Barbara Cower, MD  omeprazole (PRILOSEC) 20 MG capsule Take 1 capsule (20 mg total) by mouth 2 (two) times daily before a meal. 06/12/21  Yes Yeraldin Litzenberger, Barbara Cower, MD  sucralfate (CARAFATE) 1 GM/10ML suspension Take 10 mLs (1 g total) by mouth 4 (four) times daily -  with meals and at bedtime. 06/12/21  Yes Makaria Poarch, Barbara Cower, MD  ARIPiprazole (ABILIFY) 15 MG tablet Take 15 mg by mouth daily.    [provider]  carbamazepine (TEGRETOL) 100 MG chewable tablet Chew 1 tablet (100 mg total) by mouth 3 (three) times daily. 11/22/19   Malvin Johns, MD  gabapentin (NEURONTIN) 300 MG capsule Take 1 capsule (300 mg total) by mouth 3 (three) times daily. 11/22/19   Malvin Johns, MD  lidocaine (XYLOCAINE) 2 % solution Use as directed 15 mLs in the mouth or throat every 6 (six) hours as needed for mouth pain.  04/22/20   [provider]  medroxyPROGESTERone (DEPO-PROVERA) 150 MG/ML injection Inject 150 mg into the muscle every 3 (three) months.  04/28/20   [provider]    Allergies    Iron  Review of Systems   Review of Systems  HENT:  Negative for sore throat.   Gastrointestinal:  Positive for vomiting. Negative for abdominal pain.  All other systems reviewed and are negative.  Physical Exam Updated Vital Signs BP 132/71   Pulse (!) 119   Temp 98.1 F (36.7 C) (Oral)   Resp 15   Ht 5\' 10"  (1.778 m)   Wt 83.9 kg   SpO2 100%   BMI 26.54 kg/m   Physical Exam Vitals and nursing note reviewed.  Constitutional:      Appearance: She is well-developed.  HENT:     Head: Normocephalic and atraumatic.     Mouth/Throat:     Mouth: Mucous membranes are moist.     Pharynx: Oropharynx is clear.  Eyes:     Pupils: Pupils are equal, round, and reactive to light.  Cardiovascular:     Rate and Rhythm: Normal rate and regular rhythm.  Pulmonary:     Effort: No respiratory distress.     Breath sounds: No stridor.  Abdominal:     General: There is no distension.  Musculoskeletal:        General: No swelling or tenderness. Normal range of motion.     Cervical back: Normal range of motion.  Skin:    General: Skin is warm and dry.  Neurological:     General: No focal deficit present.     Mental Status: She is alert.    ED Results / Procedures / Treatments   Labs (all labs ordered are listed, but only abnormal results are displayed) Labs Reviewed  CBC WITH  DIFFERENTIAL/PLATELET - Abnormal; Notable for the following components:      Result Value   RBC 3.62 (*)    Hemoglobin 7.0 (*)    HCT 24.0 (*)    MCV 66.3 (*)    MCH 19.3 (*)    MCHC 29.2 (*)  RDW 15.9 (*)    Platelets 497 (*)    All other components within normal limits  COMPREHENSIVE METABOLIC PANEL - Abnormal; Notable for the following components:   Glucose, Bld 112 (*)    Calcium 8.6 (*)    Total Protein 6.1 (*)    AST 14 (*)    Total Bilirubin 0.2 (*)    All other components within normal limits  CULTURE, BLOOD (SINGLE)  LIPASE, BLOOD    EKG None  Radiology No results found.  Procedures Procedures   Medications Ordered in ED Medications  pantoprazole (PROTONIX) injection 40 mg (40 mg Intravenous Given 06/12/21 0546)  LORazepam (ATIVAN) injection 1 mg (1 mg Intravenous Given 06/12/21 0546)  lactated ringers bolus 1,000 mL (1,000 mLs Intravenous New Bag/Given 06/12/21 0545)    ED Course  I have reviewed the triage vital signs and the nursing notes.  Pertinent labs & imaging results that were available during my care of the patient were reviewed by me and considered in my medical decision making (see chart for details).    MDM Rules/Calculators/A&P                         Eval for upper gi bleed. Slightly tachy but related to meth use pta.  Heart rate improved with fluids and Ativan 103.  When she gets up and moves around to get animated does go back up.  Her hemoglobin is a little bit low but not requiring transfusion.  She is not had any further episodes of emesis or hematemesis here in the emergency room in over 3 hours.  She is already on appropriate outpatient therapy we will go and increase the amount of omeprazole she is taken at an Pepcid and Carafate.  She will follow-up with GI.  She will return to emergency room for any new or worsening symptoms.  Final Clinical Impression(s) / ED Diagnoses Final diagnoses:  Hematemesis with nausea    Rx / DC  Orders ED Discharge Orders          Ordered    omeprazole (PRILOSEC) 20 MG capsule  2 times daily before meals        06/12/21 0709    famotidine (PEPCID) 20 MG tablet  2 times daily        06/12/21 0709    sucralfate (CARAFATE) 1 GM/10ML suspension  3 times daily with meals & bedtime        06/12/21 0709             Beatryce Colombo, Barbara Cower, MD 06/13/21 3016

## 2021-06-12 NOTE — ED Triage Notes (Signed)
Pt reports fatigue for a month and got worse tonight  - states she vomited with blood 2 days ago   Has hx of anemia - no signs of distress at this time

## 2021-06-16 ENCOUNTER — Telehealth: Payer: Self-pay

## 2021-06-16 NOTE — Telephone Encounter (Signed)
Called pt to schedule ED f/u per CMA Dottie. Pt has [employer], which we do not par with. Pt declined appt.

## 2021-06-17 LAB — CULTURE, BLOOD (SINGLE)
Culture: NO GROWTH
Special Requests: ADEQUATE

## 2021-07-14 ENCOUNTER — Ambulatory Visit: Payer: BLUE CROSS/BLUE SHIELD | Admitting: Physician Assistant

## 2022-02-09 ENCOUNTER — Encounter (HOSPITAL_BASED_OUTPATIENT_CLINIC_OR_DEPARTMENT_OTHER): Payer: Self-pay

## 2022-02-09 ENCOUNTER — Other Ambulatory Visit: Payer: Self-pay

## 2022-02-09 ENCOUNTER — Emergency Department (HOSPITAL_BASED_OUTPATIENT_CLINIC_OR_DEPARTMENT_OTHER)
Admission: EM | Admit: 2022-02-09 | Discharge: 2022-02-09 | Discharge status: Against Medical Advice | Payer: BLUE CROSS/BLUE SHIELD | Source: Home / Self Care

## 2022-02-09 DIAGNOSIS — T391X1A Poisoning by 4-Aminophenol derivatives, accidental (unintentional), initial encounter: Secondary | ICD-10-CM | POA: Insufficient documentation

## 2022-02-09 DIAGNOSIS — D649 Anemia, unspecified: Secondary | ICD-10-CM | POA: Diagnosis not present

## 2022-02-09 DIAGNOSIS — Z5321 Procedure and treatment not carried out due to patient leaving prior to being seen by health care provider: Secondary | ICD-10-CM | POA: Insufficient documentation

## 2022-02-09 DIAGNOSIS — K2211 Ulcer of esophagus with bleeding: Secondary | ICD-10-CM | POA: Diagnosis not present

## 2022-02-09 DIAGNOSIS — R519 Headache, unspecified: Secondary | ICD-10-CM | POA: Insufficient documentation

## 2022-02-09 NOTE — ED Notes (Signed)
This RN contacted Poison Control regarding Patient Condition and Situation. Recommended Blood Specimens to be collected and assessed to determine Patient Care.   No EKG recommended at this Time. RN to place appropriate Blood Collection Orders.

## 2022-02-09 NOTE — ED Notes (Signed)
Multiple attempts by different ED Staff to obtain Blood Specimens for assessment with no Success. This RN was to attempt to use US Guidance for Blood Specimen collection but Patient is not visualized in Department. No Staff or Registration have visualized Patient Departure. Staff will continue to monitor for patient.

## 2022-02-09 NOTE — ED Triage Notes (Signed)
Patient here POV from Home with Possible Overdose.  Patient has been taking Tylenol recently for Dental Cavity.   Patient took 5 Tablets (500 mg) of Tylenol 3-4 separate times in the span of 3-4 hours last PM.  Did have Episode of Emesis 3-4 Hours after last intake of Tylenol.   No Complaints besides Mild Nausea and Headache.  NAD Noted during Triage. A&Ox4. GCS 15. Ambulatory.

## 2022-02-10 ENCOUNTER — Inpatient Hospital Stay (HOSPITAL_COMMUNITY)
Admission: EM | Admit: 2022-02-10 | Discharge: 2022-02-12 | DRG: 381 | Disposition: A | Discharge status: Home / Self Care | Payer: BLUE CROSS/BLUE SHIELD | Attending: Internal Medicine | Admitting: Internal Medicine

## 2022-02-10 ENCOUNTER — Other Ambulatory Visit: Payer: Self-pay

## 2022-02-10 ENCOUNTER — Encounter (HOSPITAL_COMMUNITY): Payer: Self-pay | Admitting: Emergency Medicine

## 2022-02-10 DIAGNOSIS — D649 Anemia, unspecified: Principal | ICD-10-CM

## 2022-02-10 DIAGNOSIS — F5000 Anorexia nervosa, unspecified: Secondary | ICD-10-CM | POA: Diagnosis present

## 2022-02-10 DIAGNOSIS — K2211 Ulcer of esophagus with bleeding: Principal | ICD-10-CM | POA: Diagnosis present

## 2022-02-10 DIAGNOSIS — D75839 Thrombocytosis, unspecified: Secondary | ICD-10-CM | POA: Diagnosis present

## 2022-02-10 DIAGNOSIS — R1310 Dysphagia, unspecified: Secondary | ICD-10-CM | POA: Diagnosis present

## 2022-02-10 DIAGNOSIS — E46 Unspecified protein-calorie malnutrition: Secondary | ICD-10-CM | POA: Diagnosis present

## 2022-02-10 DIAGNOSIS — K0889 Other specified disorders of teeth and supporting structures: Secondary | ICD-10-CM | POA: Diagnosis present

## 2022-02-10 DIAGNOSIS — D509 Iron deficiency anemia, unspecified: Secondary | ICD-10-CM | POA: Diagnosis present

## 2022-02-10 DIAGNOSIS — K209 Esophagitis, unspecified without bleeding: Secondary | ICD-10-CM | POA: Diagnosis present

## 2022-02-10 DIAGNOSIS — Y92009 Unspecified place in unspecified non-institutional (private) residence as the place of occurrence of the external cause: Secondary | ICD-10-CM

## 2022-02-10 DIAGNOSIS — Z808 Family history of malignant neoplasm of other organs or systems: Secondary | ICD-10-CM

## 2022-02-10 DIAGNOSIS — R519 Headache, unspecified: Secondary | ICD-10-CM | POA: Diagnosis present

## 2022-02-10 DIAGNOSIS — Z20822 Contact with and (suspected) exposure to covid-19: Secondary | ICD-10-CM | POA: Diagnosis present

## 2022-02-10 DIAGNOSIS — Z79899 Other long term (current) drug therapy: Secondary | ICD-10-CM

## 2022-02-10 DIAGNOSIS — F319 Bipolar disorder, unspecified: Secondary | ICD-10-CM | POA: Diagnosis present

## 2022-02-10 DIAGNOSIS — Z800 Family history of malignant neoplasm of digestive organs: Secondary | ICD-10-CM

## 2022-02-10 DIAGNOSIS — F502 Bulimia nervosa: Secondary | ICD-10-CM | POA: Diagnosis present

## 2022-02-10 DIAGNOSIS — F913 Oppositional defiant disorder: Secondary | ICD-10-CM | POA: Diagnosis present

## 2022-02-10 DIAGNOSIS — K449 Diaphragmatic hernia without obstruction or gangrene: Secondary | ICD-10-CM

## 2022-02-10 DIAGNOSIS — F17210 Nicotine dependence, cigarettes, uncomplicated: Secondary | ICD-10-CM | POA: Diagnosis present

## 2022-02-10 DIAGNOSIS — Z9114 Patient's other noncompliance with medication regimen: Secondary | ICD-10-CM

## 2022-02-10 DIAGNOSIS — Z8719 Personal history of other diseases of the digestive system: Secondary | ICD-10-CM

## 2022-02-10 DIAGNOSIS — T391X1A Poisoning by 4-Aminophenol derivatives, accidental (unintentional), initial encounter: Secondary | ICD-10-CM | POA: Diagnosis present

## 2022-02-10 DIAGNOSIS — F339 Major depressive disorder, recurrent, unspecified: Secondary | ICD-10-CM | POA: Diagnosis present

## 2022-02-10 DIAGNOSIS — Z818 Family history of other mental and behavioral disorders: Secondary | ICD-10-CM

## 2022-02-10 DIAGNOSIS — K208 Other esophagitis without bleeding: Secondary | ICD-10-CM | POA: Diagnosis present

## 2022-02-10 DIAGNOSIS — R5383 Other fatigue: Secondary | ICD-10-CM | POA: Diagnosis present

## 2022-02-10 LAB — COMPREHENSIVE METABOLIC PANEL
ALT: 18 U/L (ref 0–44)
AST: 23 U/L (ref 15–41)
Albumin: 3 g/dL — ABNORMAL LOW (ref 3.5–5.0)
Alkaline Phosphatase: 47 U/L (ref 38–126)
Anion gap: 6 (ref 5–15)
BUN: 17 mg/dL (ref 6–20)
CO2: 24 mmol/L (ref 22–32)
Calcium: 8.4 mg/dL — ABNORMAL LOW (ref 8.9–10.3)
Chloride: 105 mmol/L (ref 98–111)
Creatinine, Ser: 0.79 mg/dL (ref 0.44–1.00)
GFR, Estimated: >60 mL/min (ref >60)
Glucose, Bld: 115 mg/dL — ABNORMAL HIGH (ref 70–99)
Potassium: 3.8 mmol/L (ref 3.5–5.1)
Sodium: 135 mmol/L (ref 135–145)
Total Bilirubin: 0.1 mg/dL — ABNORMAL LOW (ref 0.3–1.2)
Total Protein: 6.1 g/dL — ABNORMAL LOW (ref 6.5–8.1)

## 2022-02-10 LAB — POC OCCULT BLOOD, ED: Fecal Occult Bld: NEGATIVE

## 2022-02-10 LAB — CBC WITH DIFFERENTIAL/PLATELET
Abs Immature Granulocytes: 0.01 10*3/uL (ref 0.00–0.07)
Basophils Absolute: 0.1 10*3/uL (ref 0.0–0.1)
Basophils Relative: 1 %
Eosinophils Absolute: 0.1 10*3/uL (ref 0.0–0.5)
Eosinophils Relative: 1 %
HCT: 21.8 % — ABNORMAL LOW (ref 36.0–46.0)
Hemoglobin: 6.2 g/dL — CL (ref 12.0–15.0)
Immature Granulocytes: 0 %
Lymphocytes Relative: 40 %
Lymphs Abs: 3.4 10*3/uL (ref 0.7–4.0)
MCH: 17.9 pg — ABNORMAL LOW (ref 26.0–34.0)
MCHC: 28.4 g/dL — ABNORMAL LOW (ref 30.0–36.0)
MCV: 62.8 fL — ABNORMAL LOW (ref 80.0–100.0)
Monocytes Absolute: 0.5 10*3/uL (ref 0.1–1.0)
Monocytes Relative: 6 %
Neutro Abs: 4.6 10*3/uL (ref 1.7–7.7)
Neutrophils Relative %: 52 %
Platelets: 507 10*3/uL — ABNORMAL HIGH (ref 150–400)
RBC: 3.47 MIL/uL — ABNORMAL LOW (ref 3.87–5.11)
RDW: 18.6 % — ABNORMAL HIGH (ref 11.5–15.5)
WBC: 8.7 10*3/uL (ref 4.0–10.5)
nRBC: 0 % (ref 0.0–0.2)

## 2022-02-10 LAB — RESP PANEL BY RT-PCR (FLU A&B, COVID) ARPGX2
Influenza A by PCR: NEGATIVE
Influenza B by PCR: NEGATIVE
SARS Coronavirus 2 by RT PCR: NEGATIVE

## 2022-02-10 LAB — RAPID URINE DRUG SCREEN, HOSP PERFORMED
Amphetamines: NOT DETECTED
Barbiturates: NOT DETECTED
Benzodiazepines: NOT DETECTED
Cocaine: NOT DETECTED
Opiates: NOT DETECTED
Tetrahydrocannabinol: NOT DETECTED

## 2022-02-10 LAB — I-STAT BETA HCG BLOOD, ED (MC, WL, AP ONLY): I-stat hCG, quantitative: <5 m[IU]/mL (ref <5)

## 2022-02-10 LAB — PREPARE RBC (CROSSMATCH)

## 2022-02-10 LAB — ACETAMINOPHEN LEVEL: Acetaminophen (Tylenol), Serum: <10 ug/mL — ABNORMAL LOW (ref 10–30)

## 2022-02-10 LAB — HEMOGLOBIN AND HEMATOCRIT, BLOOD
HCT: 24.4 % — ABNORMAL LOW (ref 36.0–46.0)
Hemoglobin: 7.4 g/dL — ABNORMAL LOW (ref 12.0–15.0)

## 2022-02-10 LAB — SALICYLATE LEVEL: Salicylate Lvl: <7 mg/dL — ABNORMAL LOW (ref 7.0–30.0)

## 2022-02-10 MED ORDER — HYDROXYZINE HCL 25 MG PO TABS
100.0000 mg | ORAL_TABLET | Freq: Every day | ORAL | Status: DC
  Administered 2022-02-10 – 2022-02-11 (×2): 100 mg via ORAL
  Filled 2022-02-10 (×2): qty 4

## 2022-02-10 MED ORDER — SODIUM CHLORIDE 0.9 % IV SOLN
10.0000 mL/h | Freq: Once | INTRAVENOUS | Status: AC
  Administered 2022-02-10: 10 mL/h via INTRAVENOUS

## 2022-02-10 MED ORDER — MIRTAZAPINE 15 MG PO TABS
15.0000 mg | ORAL_TABLET | Freq: Every day | ORAL | Status: DC
  Administered 2022-02-10 – 2022-02-11 (×2): 15 mg via ORAL
  Filled 2022-02-10 (×2): qty 1

## 2022-02-10 MED ORDER — PANTOPRAZOLE SODIUM 40 MG PO TBEC
40.0000 mg | DELAYED_RELEASE_TABLET | Freq: Every day | ORAL | Status: DC
  Administered 2022-02-10 – 2022-02-12 (×3): 40 mg via ORAL
  Filled 2022-02-10 (×3): qty 1

## 2022-02-10 MED ORDER — GABAPENTIN 300 MG PO CAPS
300.0000 mg | ORAL_CAPSULE | Freq: Three times a day (TID) | ORAL | Status: DC
  Administered 2022-02-10 – 2022-02-12 (×8): 300 mg via ORAL
  Filled 2022-02-10 (×8): qty 1

## 2022-02-10 MED ORDER — BUPROPION HCL ER (XL) 150 MG PO TB24
150.0000 mg | ORAL_TABLET | Freq: Every day | ORAL | Status: DC
  Administered 2022-02-10 – 2022-02-11 (×2): 150 mg via ORAL
  Filled 2022-02-10 (×2): qty 1

## 2022-02-10 MED ORDER — ESCITALOPRAM OXALATE 20 MG PO TABS
20.0000 mg | ORAL_TABLET | Freq: Every day | ORAL | Status: DC
Start: 2022-02-10 — End: 2022-02-12
  Administered 2022-02-10 – 2022-02-12 (×3): 20 mg via ORAL
  Filled 2022-02-10 (×2): qty 1
  Filled 2022-02-10: qty 2

## 2022-02-10 MED ORDER — CALCIUM CARBONATE ANTACID 500 MG PO CHEW
1.0000 | CHEWABLE_TABLET | Freq: Two times a day (BID) | ORAL | Status: DC | PRN
  Filled 2022-02-10: qty 2

## 2022-02-10 MED ORDER — IBUPROFEN 400 MG PO TABS: 400.0000 mg | ORAL_TABLET | Freq: Four times a day (QID) | ORAL | Status: DC | PRN

## 2022-02-10 MED ORDER — SODIUM CHLORIDE 0.9 % IV BOLUS
1000.0000 mL | Freq: Once | INTRAVENOUS | Status: AC
  Administered 2022-02-10: 1000 mL via INTRAVENOUS

## 2022-02-10 NOTE — ED Notes (Signed)
Pt refused phlebotomy and states she wants to wait for IV team to come.

## 2022-02-10 NOTE — H&P (Signed)
Date: 02/10/2022               Patient Name:  Danielle Prince MRN: [id number]  DOB: 25-Nov-1996 Age / Sex: 26 y.o., female   PCP: Pcp, No         Medical Service: Internal Medicine Teaching Service         Attending Physician: Dr. Lucious Groves, DO    First Contact: Dr. Marlou Sa Pager: [phone]  Second Contact: Dr. Richardson Dopp Pager: [phone]       After Hours (After 5p/  First Contact Pager: [phone]  weekends / holidays): Second Contact Pager: [phone]   Chief Complaint: tylenol overdose    History of Present Illness: Ms. Danielle Prince is a 26 yoF with a medical history significant for anemia, major depressive disorder, polysubstance abuse, bipolar disorder and oppositional defiant disorder presenting with concerns of Tylenol overdose.  Patient states yesterday morning she was having a toothache and subsequently severe headache for which she took Tylenol.  She took about a total of 15-20 Tylenol 500 mg tablets in the span of 3 hours.  States that the Tylenol helped alleviate the pain and headache however she subsequently had abdominal pain.  She was concerned she took too much Tylenol and [facility] but left before being evaluated.  She returned to the ER this morning due to waking up with swelling and puffiness around both of her eyes.  She was concerned that this was related to her Tylenol use.  She denies any fevers, chills, headache, tooth ache, chest pain, shortness of breath, abdominal pain nausea or vomiting.  She denies any suicidal ideations.  Endorses fatigue and weakness for the past several months.  She states that she has been having throat pain and swelling for the past several months.  States that at times she has difficulty swallowing foods and liquids.  She states in the past she had an eating disorder which subsequently led to an ulcer in her throat.  She also states that she has acid reflux which she thinks could be causing her pain.  States  that she has a family history of thyroid cancer.  She has never been diagnosed with thyroid disease.  She states that she is supposed to have her wisdom teeth removed.  Meds:  Current Meds  Medication Sig   buPROPion (WELLBUTRIN XL) 150 MG 24 hr tablet Take 150 mg by mouth daily.   calcium carbonate (TUMS - DOSED IN MG ELEMENTAL CALCIUM) 500 MG chewable tablet Chew 1-2 tablets by mouth 2 (two) times daily as needed for indigestion or heartburn.   escitalopram (LEXAPRO) 20 MG tablet Take 20 mg by mouth daily.   gabapentin (NEURONTIN) 300 MG capsule Take 1 capsule (300 mg total) by mouth 3 (three) times daily.   hydrOXYzine (VISTARIL) 50 MG capsule Take 100 mg by mouth at bedtime.   mirtazapine (REMERON) 15 MG tablet Take 15 mg by mouth at bedtime.   omeprazole (PRILOSEC) 20 MG capsule Take 1 capsule (20 mg total) by mouth 2 (two) times daily before a meal. (Patient taking differently: Take 20 mg by mouth daily.)     Allergies: Allergies as of 02/10/2022 - Review Complete 02/10/2022  Allergen Reaction Noted   Iron  05/09/2020   Past Medical History:  Diagnosis Date   ADHD (attention deficit hyperactivity disorder)    ADHD (attention deficit hyperactivity disorder)    Anorexia nervosa with bulimia    Anxiety  Aspirin overdose 02/2012   Depression    Dysrhythmia    Eating disorder    Electrolyte disturbance 2010, 2012   hypokalemia, hypochloremia.   Urinary tract infection    Varicella    Vision abnormalities     Family History:  Family History  Problem Relation Age of Onset   Cancer Father    Depression Sister    Depression Paternal Aunt    Drug abuse Maternal Uncle        deceased   Drug abuse Paternal Aunt    Colon cancer Maternal Grandmother   Reports family history of thyroid cancer.  Social History: Lives at home with her mom.  States that she is currently in school.  Does not feel like she has a good support system at home.  Smokes about half a pack a day, for the  past 10 years.  States that she has been sober for about a month but previously used illicit substances including IV methamphetamine and opioids.  Denies any alcohol use.  Review of Systems: A complete ROS was negative except as per HPI.   Physical Exam: Blood pressure 106/72, pulse 83, temperature 97.7 F (36.5 C), temperature source Oral, resp. rate 11, SpO2 99 %. Physical Exam General: alert, appears stated age, in no acute distress HEENT: Normocephalic, atraumatic, EOM intact, conjunctiva normal, second molar on the bottom right appears to be cracked, no dental abscess or drainage appreciated, no cervical lymphadenopathy, no conjunctival pallor CV: Regular rate and rhythm, no murmurs rubs or gallops Pulm: Clear to auscultation bilaterally, normal work of breathing Abdomen: Soft, nondistended, bowel sounds present, no tenderness to palpation MSK: No lower extremity edema Skin: Warm and dry Neuro: Alert and oriented x3   EKG: personally reviewed my interpretation is normal sinus rhythm  CXR: personally reviewed my interpretation is n/a  Assessment & Plan by Problem: Principal Problem:   Symptomatic anemia Active Problems:   MDD (major depressive disorder), recurrent episode (HCC)   Fatigue   Microcytic anemia  Ms. Danielle Prince is a 26 yoF with a medical history significant for anemia, esophagitis, major depressive disorder, polysubstance abuse, bipolar disorder and bulimia nervosa presenting with concerns of Tylenol overdose.  Admitted to IMTS for symptomatic anemia.   Symptomatic anemia Hemoglobin 6.2.  Patient endorses several months of fatigue and weakness.  Denies any shortness of breath or signs or symptoms of bleeding.  Denies menorrhagia or GI bleed.  Rectal exam in the ER was unremarkable for hemorrhoids for bright red blood.  In the past deemed to have iron deficiency anemia secondary to bulimia nervosa however patient denies any eating disorders at this time.  In  2015 patient had an upper endoscopy that showed esophagitis secondary to acid reflux and vomiting as well as a 3 cm hiatal hernia.  Point-of-care fecal occult negative.  ED provider ordered 2 units of PRBC.  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  Hemoglobin was 7 about 8 months ago and over a year ago below 6, requiring blood transfusion at that time. -Follow-up posttransfusion H&H -Follow-up iron studies  Tylenol overdose  Patient initially presented due to concerns for Tylenol overdose.  She took about 15 to 2500 mg tablets of Tylenol for toothache and headache.  Denies any suicidal ideations or plans.  She presented outside the window for treatment and of note her acetaminophen level and liver function is normal.  She initially had abdominal pain but this has not  resolved.  Continue to monitor.  Difficulty swallowing Throat swelling Tooth pain Patient reports several month history of intermittent difficulty swallowing liquids and solids, feeling like her throat is swollen and tooth pain.  States that she has needed her wisdom teeth removed.  Denies any fevers or chills. No signs/symptoms of infectious etiology.  No cervical lymphadenopathy, oral abscesses or drainage appreciated. The 2nd molar on the bottom right appears cracked. Recommend patient follow up with dentist. Will monitor while inpatient.   Thrombocytosis Platelets 507, noted to be chronically elevated.  Likely reactive in the setting of anemia.  Monitor CBC.  MDD  Bipolar disorder History of SI History of bulimia nervosa -Continue Home medications, bupropion 150 mg daily, Lexapro 20 mg daily, hydroxyzine 100 mg nightly and mirtazapine 15 mg nightly  Polysubstance abuse  Patient endorses a history of IV drug use but states that she has been sober for about 1 month now.  Previously used IV crystal methamphetamines and opioids.  She also smokes about half a pack of  cigarettes a day for the past 10 years.  Denies any alcohol use. -Encouraged continued cessation  GERD History of esophagitis Home medications include omeprazole and Tums as needed.  Continue Protonix, omeprazole equivalent.   Diet: Regular VTE ppx: SCDs Full code  Dispo: Admit patient to Observation with expected length of stay less than 2 midnights.  SignedMike Craze, DO 02/10/2022, 2:08 PM  [phone] After 5pm on weekdays and 1pm on weekends: On Call pager: [phone]

## 2022-02-10 NOTE — ED Provider Notes (Signed)
[facility] EMERGENCY DEPARTMENT Provider Note   CSN: [id number] Arrival date & time: 02/10/22  [id number]     History  Chief Complaint  Patient presents with   Tylenol OD     Danielle Prince is a 26 y.o. female.  The history is provided by the patient and medical records. No language interpreter was used.   This is a 26 year old female with significant history of IV drug use, depression, oppositional defiant disorder, bipolar, polysubstance abuse presenting with complaint of unintentional Tylenol overdose.  Patient report yesterday morning around 1 to 3 AM, she was taking quite a few Tylenol to help with tooth ache.  States that she takes about 5 pills (500mg ) each time for a total of 15-20 Tylenol in a span of 3 hours.  States that headache and toothache did improve however she was concerns about taking too much Tylenol and went to [facility] ER yesterday however left before being seen.  She mention her situation to her neighbor who is also a nurse recommend patient she should go and get checked out.  This morning she noticed some puffiness around both eyes thus prompting this ER visit.  At this time she denies having headache, toothache, chest pain, trouble breathing, abdominal pain, nausea or vomiting.  She also denies SI HI.    Home Medications Prior to Admission medications   Medication Sig Start Date End Date Taking? Authorizing Provider  ARIPiprazole (ABILIFY) 15 MG tablet Take 15 mg by mouth daily.    [provider]  carbamazepine (TEGRETOL) 100 MG chewable tablet Chew 1 tablet (100 mg total) by mouth 3 (three) times daily. 11/22/19   Johnn Hai, MD  famotidine (PEPCID) 20 MG tablet Take 1 tablet (20 mg total) by mouth 2 (two) times daily for 7 days. 06/12/21 06/19/21  Mesner, Corene Cornea, MD  gabapentin (NEURONTIN) 300 MG capsule Take 1 capsule (300 mg total) by mouth 3 (three) times daily. 11/22/19   Johnn Hai, MD  lidocaine (XYLOCAINE) 2 % solution Use as  directed 15 mLs in the mouth or throat every 6 (six) hours as needed for mouth pain.  04/22/20   [provider]  medroxyPROGESTERone (DEPO-PROVERA) 150 MG/ML injection Inject 150 mg into the muscle every 3 (three) months.  04/28/20   [provider]  omeprazole (PRILOSEC) 20 MG capsule Take 1 capsule (20 mg total) by mouth 2 (two) times daily before a meal. 06/12/21   Mesner, Corene Cornea, MD  sucralfate (CARAFATE) 1 GM/10ML suspension Take 10 mLs (1 g total) by mouth 4 (four) times daily -  with meals and at bedtime. 06/12/21   Mesner, Corene Cornea, MD      Allergies    Iron    Review of Systems   Review of Systems  All other systems reviewed and are negative.  Physical Exam Updated Vital Signs BP 124/75 (BP Location: Right Arm)    Pulse (!) 101    Temp 97.7 F (36.5 C) (Oral)    Resp 17    SpO2 99%  Physical Exam Vitals and nursing note reviewed.  Constitutional:      General: She is not in acute distress.    Appearance: She is well-developed.  HENT:     Head: Atraumatic.  Eyes:     Conjunctiva/sclera: Conjunctivae normal.  Cardiovascular:     Rate and Rhythm: Normal rate and regular rhythm.     Pulses: Normal pulses.     Heart sounds: Normal heart sounds.  Pulmonary:  Effort: Pulmonary effort is normal.  Abdominal:     Palpations: Abdomen is soft.     Tenderness: There is no abdominal tenderness.  Genitourinary:    Comments: Chaperone present during exam.  Normal external perianal region, no thrombosed hemorrhoid, normal rectal tone, no obvious mass, normal color stool on glove.   Musculoskeletal:     Cervical back: Neck supple.  Skin:    Coloration: Skin is pale.     Findings: No rash.  Neurological:     Mental Status: She is alert. Mental status is at baseline.  Psychiatric:        Mood and Affect: Mood normal.        Speech: Speech normal.        Behavior: Behavior is cooperative.        Thought Content: Thought content does not include homicidal or suicidal  ideation.    ED Results / Procedures / Treatments   Labs (all labs ordered are listed, but only abnormal results are displayed) Labs Reviewed  COMPREHENSIVE METABOLIC PANEL - Abnormal; Notable for the following components:      Result Value   Glucose, Bld 115 (*)    Calcium 8.4 (*)    Total Protein 6.1 (*)    Albumin 3.0 (*)    Total Bilirubin 0.1 (*)    All other components within normal limits  CBC WITH DIFFERENTIAL/PLATELET - Abnormal; Notable for the following components:   RBC 3.47 (*)    Hemoglobin 6.2 (*)    HCT 21.8 (*)    MCV 62.8 (*)    MCH 17.9 (*)    MCHC 28.4 (*)    RDW 18.6 (*)    Platelets 507 (*)    All other components within normal limits  SALICYLATE LEVEL - Abnormal; Notable for the following components:   Salicylate Lvl [id number] (*)    All other components within normal limits  ACETAMINOPHEN LEVEL - Abnormal; Notable for the following components:   Acetaminophen (Tylenol), Serum <10 (*)    All other components within normal limits  RESP PANEL BY RT-PCR (FLU A&B, COVID) ARPGX2  RAPID URINE DRUG SCREEN, HOSP PERFORMED  ETHANOL  I-STAT BETA HCG BLOOD, ED ([facility], [facility], [facility] ONLY)  POC OCCULT BLOOD, ED  TYPE AND SCREEN  PREPARE RBC (CROSSMATCH)    EKG EKG Interpretation  Date/Time:  Wednesday February 10 2022 08:14:03 EST Ventricular Rate:  91 PR Interval:  137 QRS Duration: 85 QT Interval:  364 QTC Calculation: 448 R Axis:   86 Text Interpretation: Sinus rhythm Confirmed by Fredia Sorrow [phone]) on 02/10/2022 8:27:10 AM  Radiology No results found.  Procedures .Critical Care Performed by: Domenic Moras, PA-C Authorized by: Domenic Moras, PA-C   Critical care provider statement:    Critical care time (minutes):  35   Critical care was time spent personally by me on the following activities:  Development of treatment plan with patient or surrogate, discussions with consultants, evaluation of patient's response to treatment, examination of patient,  ordering and review of laboratory studies, ordering and review of radiographic studies, ordering and performing treatments and interventions, pulse oximetry, re-evaluation of patient's condition and review of old charts    Medications Ordered in ED Medications  0.9 %  sodium chloride infusion (has no administration in time range)  sodium chloride 0.9 % bolus 1,000 mL (1,000 mLs Intravenous New Bag/Given 02/10/22 0933)    ED Course/ Medical Decision Making/ A&P  Medical Decision Making Amount and/or Complexity of Data Reviewed Labs: ordered.  Risk Prescription drug management.   BP 124/75 (BP Location: Right Arm)    Pulse (!) 101    Temp 97.7 F (36.5 C) (Oral)    Resp 17    SpO2 99%   7:37 AM HPI: Danielle Prince is a 26 y.o. female with a PMH of bipolar disorder, MDD with 2-3 prior hospitalizations for SI and a suicide attempt in 2019, and substance abuse who presents to the ED today with concern for facial swelling after consuming too much Tylenol. Between 01:00 and 03:00 on 2/21 patient consumed 15 500 mg Tylenol pills every 2 hours in an attempt to relieve dental pain in order to sleep. Patient states that the act was impulsive and not an attempt to harm herself. After an episode of non-bloody emesis [facility] but left the hospital due to concern for sanitary practices there. [facility] after waking with periorbital edema. Patient follows with a therapist weekly and last saw her psychiatrist last week at which time she was prescribed Lexapro for increased feelings of hopelessness and irritability over the last week or longer. She feels that the Lexapro has already provided some improvement in her mood. She has been sleeping and eating well. She has used Adderall, benzodiazepines, and opiates in the past but has not used in the last month as she would like to use healthier coping mechanisms. She denies HA, f/c, current  n/v, CP, SOB, and abdominal pain.  7:40 AM Patient took over the recommended dose of Tylenol however she is outside the 24hrs window for any Specific treatment based on the [employer] Matthew scale.  Unfortunately she is currently without any abdominal pain, no significant facial swelling noted on exam and patient is resting comfortably with stable normal vital sign.  Labs obtained and independently reviewed interpreted by me.  Fortunately her liver function is normal.  Her Tylenol level is undetectable.  Unfortunately, patient's hemoglobin is low at 6.2.  She admits that she has known history of microcytic anemia due to poor absorption as of her iron in the setting of anorexia.  I review prior notes and patient also has history of erosive esophagitis possibly contributing to her anemia as well.  Her last GI note was when she was seen by Dr. Rhea BeltonPyrtle in 2016.  Currently patient denies noticing any abnormal bleeding.  Her last menstrual period was a week ago and it was normal.  She does endorse some generalized fatigue for the past 2 to 3 weeks but this is not unusual for her.  She denies any chest pains or shortness of breath.  10:00 AM Patient is Hemoccult negative.  She will receive 2 units of packed red blood cell.  I have consulted Internal medicine teaching service resident who agrees to see patient and will admit for symptomatic anemia.  Patient is aware and agrees with plan.  This patient presents to the ED for concern of tylenol overdose, this involves an extensive number of treatment options, and is a complaint that carries with it a high risk of complications and morbidity.  The differential diagnosis includes intentional overdose, unintentional overdose, other form of drug toxicity, suicidal attempt  Co morbidities that complicate the patient evaluation polysubstance abuse  Depression  Anorexia  Bipolar  ODD   Additional history obtained:   External records from outside source obtained  and reviewed including prior GI notes from Dr. Rhea BeltonPyrtle  Lab  Tests:  I Ordered, and personally interpreted labs.  The pertinent results include:  normal liver function.  Anemia with Hgb 6.2, down from 7.0 approximately 8 mos ago   Cardiac Monitoring:  The patient was maintained on a cardiac monitor.  I personally viewed and interpreted the cardiac monitored which showed an underlying rhythm of: sinus rhythm  Medicines ordered and prescription drug management:  I ordered medication including PRBC  for symptomatic anemia Reevaluation of the patient after these medicines showed that the patient stayed the same I have reviewed the patients home medicines and have made adjustments as needed  Test Considered: repeat tylenol level, but pt is outside the treatment window  Critical Interventions: blood transfusion for symptomatic anemia  Consultations Obtained:  I requested consultation with the Internal Medicine team,  and discussed lab and imaging findings as well as pertinent plan - they recommend: admission for blood transfusion  Problem List / ED Course: symptomatic anemia  Unintentional tylenol overdose  Reevaluation:  After the interventions noted above, I reevaluated the patient and found that they have :improved  Social Determinants of Health: poor health literacy  Dispostion:  After consideration of the diagnostic results and the patients response to treatment, I feel that the patent would benefit from admission.         Final Clinical Impression(s) / ED Diagnoses Final diagnoses:  Symptomatic anemia  Unintentional Tylenol overdose, initial encounter    Rx / DC Orders ED Discharge Orders     None         Domenic Moras, PA-C 02/10/22 1004    Fredia Sorrow, MD 02/18/22 [phone]

## 2022-02-10 NOTE — ED Notes (Signed)
Room almost done.  02/10/22 1622  Hand-Off documentation  Handoff Received Received from shift RN/LPN  Report received from (Full Name) Olivia Mackie

## 2022-02-10 NOTE — Progress Notes (Addendum)
NEW ADMISSION NOTE New Admission Note:   Arrival Method: stretcher  Mental Orientation: A&OX4 Telemetry:none Assessment: Completed Skin: intact scratches bilateral arms IV:RFA Pain:0/10 Tubes:NONE Safety Measures: Safety Fall Prevention Plan has been given, discussed and signed Admission: Completed [facility] Orientation: Patient has been orientated to the room, unit and staff.  Family:None at bedside  Orders have been reviewed and implemented. Will continue to monitor the patient. Call light has been placed within reach and bed alarm has been activated.   Yomar Mejorado S Ica Daye, RN

## 2022-02-10 NOTE — ED Notes (Signed)
ED TO INPATIENT HANDOFF REPORT  ED Nurse Name and Phone #: Chenille Toor RN [phone]  S Name/Age/Gender Danielle Prince 26 y.o. female Room/Bed: 036C/036C  Code Status   Code Status: Full Code  Home/SNF/Other Home Patient oriented to: self, place, time, and situation Is this baseline? Yes   Triage Complete: Triage complete  Chief Complaint Symptomatic anemia [D64.9]  Triage Note Patient reports taking a total 15 tabs of Tylenol 500 mg tabs q 2-3 hours today , he added intermittent emesis and swelling at bilateral eyes . He denies suicidal ideation .    Allergies Allergies  Allergen Reactions   Iron     Iron infusion    Level of Care/Admitting Diagnosis ED Disposition     ED Disposition  Admit   Condition  --   Comment  Hospital Area: [facility] [[id number]]  Level of Care: Telemetry Medical [[id number]]  May place patient in observation at [facility] or [facility] if equivalent level of care is available:: Yes  Covid Evaluation: Asymptomatic Screening Protocol (No Symptoms)  Diagnosis: Symptomatic anemia [[id number]]  Admitting Physician: Gust Rung [[id number]]  Attending Physician: Gust Rung [[id number]]          B Medical/Surgery History Past Medical History:  Diagnosis Date   ADHD (attention deficit hyperactivity disorder)    ADHD (attention deficit hyperactivity disorder)    Anorexia nervosa with bulimia    Anxiety    Aspirin overdose 02/2012   Depression    Dysrhythmia    Eating disorder    Electrolyte disturbance 2010, 2012   hypokalemia, hypochloremia.   Urinary tract infection    Varicella    Vision abnormalities    Past Surgical History:  Procedure Laterality Date   COLONOSCOPY WITH ESOPHAGOGASTRODUODENOSCOPY (EGD) N/A 10/28/2013   Procedure: COLONOSCOPY WITH ESOPHAGOGASTRODUODENOSCOPY (EGD);  Surgeon: Beverley Fiedler, MD;  Location: [facility];  Service: Endoscopy;  Laterality: N/A;   GUM SURGERY     INDUCED ABORTION       A IV  Location/Drains/Wounds Patient Lines/Drains/Airways Status     Active Line/Drains/Airways     Name Placement date Placement time Site Days   Peripheral IV 02/10/22 22 G 1.75" Anterior;Right Forearm 02/10/22  0920  Forearm  less than 1            Intake/Output Last 24 hours  Intake/Output Summary (Last 24 hours) at 02/10/2022 1553 Last data filed at 02/10/2022 1445 Gross per 24 hour  Intake 1564 ml  Output --  Net 1564 ml    Labs/Imaging Results for orders placed or performed during the hospital encounter of 02/10/22 (from the past 48 hour(s))  Comprehensive metabolic panel     Status: Abnormal   Collection Time: 02/10/22  6:24 AM  Result Value Ref Range   Sodium 135 135 - 145 mmol/L   Potassium 3.8 3.5 - 5.1 mmol/L   Chloride 105 98 - 111 mmol/L   CO2 24 22 - 32 mmol/L   Glucose, Bld 115 (H) 70 - 99 mg/dL    Comment: Glucose reference range applies only to samples taken after fasting for at least 8 hours.   BUN 17 6 - 20 mg/dL   Creatinine, Ser 8.24 0.44 - 1.00 mg/dL   Calcium 8.4 (L) 8.9 - 10.3 mg/dL   Total Protein 6.1 (L) 6.5 - 8.1 g/dL   Albumin 3.0 (L) 3.5 - 5.0 g/dL   AST 23 15 - 41 U/L   ALT 18 0 - 44 U/L  Alkaline Phosphatase 47 38 - 126 U/L   Total Bilirubin 0.1 (L) 0.3 - 1.2 mg/dL   GFR, Estimated >16>60 >10>60 mL/min    Comment: (NOTE) Calculated using the CKD-EPI Creatinine Equation (2021)    Anion gap 6 5 - 15    Comment: Performed at [facility], [address] [place], [place] [address]  CBC with Differential     Status: Abnormal   Collection Time: 02/10/22  6:24 AM  Result Value Ref Range   WBC 8.7 4.0 - 10.5 K/uL   RBC 3.47 (L) 3.87 - 5.11 MIL/uL   Hemoglobin 6.2 (LL) 12.0 - 15.0 g/dL    Comment: REPEATED TO VERIFY Reticulocyte Hemoglobin testing may be clinically indicated, consider ordering this additional test [id number] THIS CRITICAL RESULT HAS VERIFIED AND BEEN CALLED TO L. DANIELS, RN BY ENIOLA ADEDOKUN ON 02 22 2023 AT 0650, AND HAS  BEEN READ BACK.  THIS CRITICAL RESULT HAS VERIFIED AND BEEN CALLED TO L. DANIELS, RN BY ENIOLA ADEDOKUN ON 02 22 2023 AT 0652, AND HAS BEEN READ BACK.     HCT 21.8 (L) 36.0 - 46.0 %   MCV 62.8 (L) 80.0 - 100.0 fL   MCH 17.9 (L) 26.0 - 34.0 pg   MCHC 28.4 (L) 30.0 - 36.0 g/dL   RDW 47.818.6 (H) 29.511.5 - 62.115.5 %   Platelets 507 (H) 150 - 400 K/uL   nRBC 0.0 0.0 - 0.2 %   Neutrophils Relative % 52 %   Neutro Abs 4.6 1.7 - 7.7 K/uL   Lymphocytes Relative 40 %   Lymphs Abs 3.4 0.7 - 4.0 K/uL   Monocytes Relative 6 %   Monocytes Absolute 0.5 0.1 - 1.0 K/uL   Eosinophils Relative 1 %   Eosinophils Absolute 0.1 0.0 - 0.5 K/uL   Basophils Relative 1 %   Basophils Absolute 0.1 0.0 - 0.1 K/uL   Immature Granulocytes 0 %   Abs Immature Granulocytes 0.01 0.00 - 0.07 K/uL    Comment: Performed at [facility], [address] [place], [place] [address]  Salicylate level     Status: Abnormal   Collection Time: 02/10/22  6:25 AM  Result Value Ref Range   Salicylate Lvl <7.0 (L) 7.0 - 30.0 mg/dL    Comment: Performed at [facility], [address] [place], [place] [address]  Acetaminophen level     Status: Abnormal   Collection Time: 02/10/22  6:25 AM  Result Value Ref Range   Acetaminophen (Tylenol), Serum <10 (L) 10 - 30 ug/mL    Comment: (NOTE) Therapeutic concentrations vary significantly. A range of 10-30 ug/mL  may be an effective concentration for many patients. However, some  are best treated at concentrations outside of this range. Acetaminophen concentrations >150 ug/mL at 4 hours after ingestion  and >50 ug/mL at 12 hours after ingestion are often associated with  toxic reactions.  Performed at [facility], [address] [place], [place] [address]   I-Stat beta hCG blood, ED     Status: None   Collection Time: 02/10/22  6:27 AM  Result Value Ref Range   I-stat hCG, quantitative <5.0 <5 mIU/mL   Comment 3            Comment:   GEST. AGE      CONC.  (mIU/mL)    <=1 WEEK        5 - 50     2 WEEKS  50 - 500     3 WEEKS       100 - 10,000     4 WEEKS     1,000 - 30,000        FEMALE AND NON-PREGNANT FEMALE:     LESS THAN 5 mIU/mL   Prepare RBC (crossmatch)     Status: None   Collection Time: 02/10/22  7:51 AM  Result Value Ref Range   Order Confirmation      ORDER PROCESSED BY BLOOD BANK Performed at [facility], [address], [place], [place] [address]   Type and screen [facility]     Status: None (Preliminary result)   Collection Time: 02/10/22  9:34 AM  Result Value Ref Range   ABO/RH(D) O NEG    Antibody Screen NEG    Sample Expiration 02/13/2022,2359    Unit Number [id number]    Blood Component Type RED CELLS,LR    Unit division 00    Status of Unit ISSUED    Transfusion Status OK TO TRANSFUSE    Crossmatch Result Compatible    Unit Number [id number]    Blood Component Type RED CELLS,LR    Unit division 00    Status of Unit ISSUED    Transfusion Status OK TO TRANSFUSE    Crossmatch Result      Compatible Performed at [facility], [address], [place], [place] [address]   Resp Panel by RT-PCR (Flu A&B, Covid) Nasopharyngeal Swab     Status: None   Collection Time: 02/10/22  9:39 AM   Specimen: Nasopharyngeal Swab; Nasopharyngeal(NP) swabs in vial transport medium  Result Value Ref Range   SARS Coronavirus 2 by RT PCR NEGATIVE NEGATIVE    Comment: (NOTE) SARS-CoV-2 target nucleic acids are NOT DETECTED.  The SARS-CoV-2 RNA is generally detectable in upper respiratory specimens during the acute phase of infection. The lowest concentration of SARS-CoV-2 viral copies this assay can detect is 138 copies/mL. A negative result does not preclude SARS-Cov-2 infection and should not be used as the sole basis for treatment or other patient management decisions. A negative result may occur with  improper specimen collection/handling, submission of specimen other than nasopharyngeal swab,  presence of viral mutation(s) within the areas targeted by this assay, and inadequate number of viral copies(<138 copies/mL). A negative result must be combined with clinical observations, patient history, and epidemiological information. The expected result is Negative.  Fact Sheet for Patients:  BloggerCourse.com  Fact Sheet for Healthcare Providers:  SeriousBroker.it  This test is no t yet approved or cleared by the [place] FDA and  has been authorized for detection and/or diagnosis of SARS-CoV-2 by FDA under an Emergency Use Authorization (EUA). This EUA will remain  in effect (meaning this test can be used) for the duration of the COVID-19 declaration under Section 564(b)(1) of the Act, 21 U.S.C.section 360bbb-3(b)(1), unless the authorization is terminated  or revoked sooner.       Influenza A by PCR NEGATIVE NEGATIVE   Influenza B by PCR NEGATIVE NEGATIVE    Comment: (NOTE) The Xpert Xpress SARS-CoV-2/FLU/RSV plus assay is intended as an aid in the diagnosis of influenza from Nasopharyngeal swab specimens and should not be used as a sole basis for treatment. Nasal washings and aspirates are unacceptable for Xpert Xpress SARS-CoV-2/FLU/RSV testing.  Fact Sheet for Patients: BloggerCourse.com  Fact Sheet for Healthcare Providers: SeriousBroker.it  This test is not yet approved or cleared by the [place]  [place] FDA and has been authorized for detection and/or diagnosis of SARS-CoV-2 by FDA under an Emergency Use Authorization (EUA). This EUA will remain in effect (meaning this test can be used) for the duration of the COVID-19 declaration under Section 564(b)(1) of the Act, 21 U.S.C. section 360bbb-3(b)(1), unless the authorization is terminated or revoked.  Performed at [facility], [address] [place], [place] [address]   POC occult blood, ED      Status: None   Collection Time: 02/10/22  9:56 AM  Result Value Ref Range   Fecal Occult Bld NEGATIVE NEGATIVE  Rapid urine drug screen (hospital performed)     Status: None   Collection Time: 02/10/22 10:47 AM  Result Value Ref Range   Opiates NONE DETECTED NONE DETECTED   Cocaine NONE DETECTED NONE DETECTED   Benzodiazepines NONE DETECTED NONE DETECTED   Amphetamines NONE DETECTED NONE DETECTED   Tetrahydrocannabinol NONE DETECTED NONE DETECTED   Barbiturates NONE DETECTED NONE DETECTED    Comment: (NOTE) DRUG SCREEN FOR MEDICAL PURPOSES ONLY.  IF CONFIRMATION IS NEEDED FOR ANY PURPOSE, NOTIFY LAB WITHIN 5 DAYS.  LOWEST DETECTABLE LIMITS FOR URINE DRUG SCREEN Drug Class                     Cutoff (ng/mL) Amphetamine and metabolites    1000 Barbiturate and metabolites    200 Benzodiazepine                 200 Tricyclics and metabolites     300 Opiates and metabolites        300 Cocaine and metabolites        300 THC                            50 Performed at [facility], [address] [place], [place] [address]    No results found.  Pending Labs Unresulted Labs (From admission, onward)     Start     Ordered   02/11/22 0500  CBC WITH DIFFERENTIAL  Tomorrow morning,   R        02/10/22 1356   02/10/22 1040  Iron and TIBC  Once,   R        02/10/22 1039   02/10/22 1040  Ferritin  Once,   R        02/10/22 1039   02/10/22 1034  HIV Antibody (routine testing w rflx)  (HIV Antibody (Routine testing w reflex) panel)  Once,   R        02/10/22 1039   02/10/22 0639  Ethanol  ONCE - STAT,   STAT        02/10/22 0638            Vitals/Pain Today's Vitals   02/10/22 1335 02/10/22 1345 02/10/22 1435 02/10/22 1450  BP: 106/72 117/61 (!) 111/95 (!) 111/95  Pulse: 83 80 (!) 109 86  Resp: 11 14 (!) 24 17  Temp:   98 F (36.7 C) 98 F (36.7 C)  TempSrc:   Oral Oral  SpO2: 99% 99% 99% 99%  PainSc:        Isolation Precautions No active  isolations  Medications Medications  buPROPion (WELLBUTRIN XL) 24 hr tablet 150 mg (150 mg Oral Given 02/10/22 1221)  escitalopram (LEXAPRO) tablet 20 mg (20 mg Oral Given 02/10/22 1221)  hydrOXYzine (ATARAX) tablet 100 mg (has no administration in time  range)  mirtazapine (REMERON) tablet 15 mg (has no administration in time range)  calcium carbonate (TUMS - dosed in mg elemental calcium) chewable tablet 200-400 mg of elemental calcium (has no administration in time range)  pantoprazole (PROTONIX) EC tablet 40 mg (40 mg Oral Given 02/10/22 1221)  gabapentin (NEURONTIN) capsule 300 mg (300 mg Oral Given 02/10/22 1221)  ibuprofen (ADVIL) tablet 400 mg (has no administration in time range)  sodium chloride 0.9 % bolus 1,000 mL (0 mLs Intravenous Stopped 02/10/22 1445)  0.9 %  sodium chloride infusion (0 mL/hr Intravenous Stopped 02/10/22 1445)    Mobility walks Low fall risk   R Recommendations: See Admitting Provider Note  Report given to: Roxanne MinsAlbert Rio RN  Additional Notes: Currently on second unit of blood. Should be finished within the next hour.

## 2022-02-10 NOTE — ED Provider Triage Note (Signed)
Emergency Medicine Provider Triage Evaluation Note  Danielle Prince , a 26 y.o. female  was evaluated in triage.  Pt complains of possible overdose.  Patient states that she was experiencing pain about 2 days ago.  She states that she took 5, 500 mg Tylenol on 3-4 different occasions over a 2-3 hour period.  She experienced an episode of vomiting but otherwise experienced no other symptoms.  Denies any abdominal pain.  She states that overnight she developed some mild swelling in the periorbital regions and decided to come to the emergency department for further evaluation.  Physical Exam  BP 124/75 (BP Location: Right Arm)    Pulse (!) 101    Temp 97.7 F (36.5 C) (Oral)    Resp 17    SpO2 99%  Gen:   Awake, no distress   Resp:  Normal effort  MSK:   Moves extremities without difficulty  Other:  Abdomen is soft and nontender.  Medical Decision Making  Medically screening exam initiated at 6:08 AM.  Appropriate orders placed.  Hephzibah Canner was informed that the remainder of the evaluation will be completed by another provider, this initial triage assessment does not replace that evaluation, and the importance of remaining in the ED until their evaluation is complete.   Placido Sou, PA-C 02/10/22 [phone]

## 2022-02-10 NOTE — ED Triage Notes (Signed)
Patient reports taking a total 15 tabs of Tylenol 500 mg tabs q 2-3 hours today , he added intermittent emesis and swelling at bilateral eyes . He denies suicidal ideation .

## 2022-02-10 NOTE — ED Notes (Signed)
Pt ambulatory to bathroom

## 2022-02-11 ENCOUNTER — Encounter (HOSPITAL_COMMUNITY): Payer: Self-pay | Admitting: Internal Medicine

## 2022-02-11 DIAGNOSIS — R1310 Dysphagia, unspecified: Secondary | ICD-10-CM

## 2022-02-11 DIAGNOSIS — D696 Thrombocytopenia, unspecified: Secondary | ICD-10-CM

## 2022-02-11 DIAGNOSIS — K0889 Other specified disorders of teeth and supporting structures: Secondary | ICD-10-CM

## 2022-02-11 DIAGNOSIS — D649 Anemia, unspecified: Secondary | ICD-10-CM | POA: Diagnosis not present

## 2022-02-11 LAB — CBC WITH DIFFERENTIAL/PLATELET
Abs Immature Granulocytes: 0.02 10*3/uL (ref 0.00–0.07)
Basophils Absolute: 0.1 10*3/uL (ref 0.0–0.1)
Basophils Relative: 1 %
Eosinophils Absolute: 0.1 10*3/uL (ref 0.0–0.5)
Eosinophils Relative: 1 %
HCT: 26.3 % — ABNORMAL LOW (ref 36.0–46.0)
Hemoglobin: 7.9 g/dL — ABNORMAL LOW (ref 12.0–15.0)
Immature Granulocytes: 0 %
Lymphocytes Relative: 35 %
Lymphs Abs: 2.5 10*3/uL (ref 0.7–4.0)
MCH: 19.8 pg — ABNORMAL LOW (ref 26.0–34.0)
MCHC: 30 g/dL (ref 30.0–36.0)
MCV: 65.8 fL — ABNORMAL LOW (ref 80.0–100.0)
Monocytes Absolute: 0.6 10*3/uL (ref 0.1–1.0)
Monocytes Relative: 8 %
Neutro Abs: 4 10*3/uL (ref 1.7–7.7)
Neutrophils Relative %: 55 %
Platelets: 463 10*3/uL — ABNORMAL HIGH (ref 150–400)
RBC: 4 MIL/uL (ref 3.87–5.11)
RDW: 22.5 % — ABNORMAL HIGH (ref 11.5–15.5)
Smear Review: ADEQUATE
WBC: 7.2 10*3/uL (ref 4.0–10.5)
nRBC: 0 % (ref 0.0–0.2)

## 2022-02-11 LAB — FERRITIN: Ferritin: 15 ng/mL (ref 11–307)

## 2022-02-11 LAB — TYPE AND SCREEN
ABO/RH(D): O NEG
Antibody Screen: NEGATIVE
Unit division: 0
Unit division: 0

## 2022-02-11 LAB — BPAM RBC
Blood Product Expiration Date: 202302272359
Blood Product Expiration Date: 202302272359
ISSUE DATE / TIME: 202302221134
ISSUE DATE / TIME: 202302221415
Unit Type and Rh: 9500
Unit Type and Rh: 9500

## 2022-02-11 LAB — RETICULOCYTES
Immature Retic Fract: 38.3 % — ABNORMAL HIGH (ref 2.3–15.9)
RBC.: 3.97 MIL/uL (ref 3.87–5.11)
Retic Count, Absolute: 48 10*3/uL (ref 19.0–186.0)
Retic Ct Pct: 1.2 % (ref 0.4–3.1)

## 2022-02-11 LAB — HIV ANTIBODY (ROUTINE TESTING W REFLEX): HIV Screen 4th Generation wRfx: NONREACTIVE

## 2022-02-11 LAB — IRON AND TIBC
Iron: 17 ug/dL — ABNORMAL LOW (ref 28–170)
Saturation Ratios: 4 % — ABNORMAL LOW (ref 10.4–31.8)
TIBC: 416 ug/dL (ref 250–450)
UIBC: 399 ug/dL

## 2022-02-11 LAB — MAGNESIUM: Magnesium: 1.9 mg/dL (ref 1.7–2.4)

## 2022-02-11 LAB — PHOSPHORUS: Phosphorus: 4.2 mg/dL (ref 2.5–4.6)

## 2022-02-11 MED ORDER — SODIUM CHLORIDE 0.9 % IV SOLN
10.0000 mg | Freq: Once | INTRAVENOUS | Status: AC
  Administered 2022-02-11: 10 mg via INTRAVENOUS
  Filled 2022-02-11: qty 1

## 2022-02-11 MED ORDER — METHYLPREDNISOLONE SODIUM SUCC 125 MG IJ SOLR
125.0000 mg | Freq: Once | INTRAMUSCULAR | Status: AC
  Administered 2022-02-11: 125 mg via INTRAVENOUS
  Filled 2022-02-11: qty 2

## 2022-02-11 MED ORDER — SODIUM CHLORIDE 0.9 % IV SOLN
750.0000 mg | Freq: Once | INTRAVENOUS | Status: AC
  Administered 2022-02-11: 750 mg via INTRAVENOUS
  Filled 2022-02-11: qty 15

## 2022-02-11 MED ORDER — ACETAMINOPHEN 325 MG PO TABS: 650.0000 mg | ORAL_TABLET | Freq: Four times a day (QID) | ORAL | Status: DC | PRN

## 2022-02-11 NOTE — Consult Note (Addendum)
[facility] Consult: 3:21 PM 02/11/2022  LOS: 0 days    Referring Provider: Dr Johnnette Gourd  Primary Care Physician:  Pcp, No Primary Gastroenterologist:  Dr. Hilarie Fredrickson    Reason for Consultation:  anemia.  Reports of blood tinged emesis in patient with bulimia related purging, vomiting   HPI: Danielle Prince is a 26 y.o. female.  PMH ADHD.  Eating disorder with bulimia/anorexia nervosa.  Previous inpatient treatment for eating disorder.  Microcytic anemia w IDA in 2013.  Hgb 5.8 in 04/2020,  7 in 05/2021 patient takes omeprazole 20 mg daily.  Not on iron.  Previously prescribed Carafate but does not use this any longer..    Inpatient GI evaluation 10/2013 for Hb 6.8 with patient reported passing of blood with bowel movement. 10/2013 EGD.  For IDA.  There was grade D esophagitis, 4 cm hiatal hernia but otherwise normal study to second duodenum.  Twice daily PPI recommended along with repeat EGD in 8 to 12 months.  Small bowel pathology showed normal mucosa, no evidence of celiac disease. 10/2013 colonoscopy.  Small, nonbleeding external hemorrhoids.  Otherwise normal study to the terminal ileum.  Trial of Linzess added. 09/2014 EGD.  For reflux follow-up.  Class B esophagitis, irregular Z-line biopsied.  3 cm HH.  Otherwise normal to second duodenum.  Pathology showed mild inflammation at EG junction, no metaplasia, dysplasia, malignancy  Transfused PRBCs x 2 in 10/2013, x 2 04/2020, x 2 01/2022 (current admission).  Ferric gluconate infusion 10/2013, this is now listed as an allergy with reaction of SOB, anxiety, rash, cough.  Patient had not been taking any acid suppressing medication but about a week ago got a new prescription for omeprazole 40 mg daily.  She describes periodic reflux disease.  This is especially  worse after episodes of self-induced vomiting which occur daily to every other day.  Occasionally sees streaks of blood in the emesis but no large-volume hematemesis or coffee-ground emesis.  She feels a sense of choking at the level of the mid neck but food seems to pass okay.  Her throat feels sore.  She describes swelling on the left neck.  It fluctuates anywhere within a 10 pound range. Taking excessive amounts of acetaminophen yesterday (15 x 500 mg tablets over3 hours) for toothache.  Tooth ache and headache improved but she developed abdominal pain.  Concerned she may have overdosed the acetaminophen and presented to the ED.  Also had swelling around her eyes.  Developed there was concern of possible overdose.  Endorses fatigue, throat pain for past several months.  For the last 3 months she has had monthly menstrual periods and these are not excessively heavy.  Prior to 3 months ago she had amenorrhea for several months. Since his admission she has been tolerating solid food.  She drinks a lot of Gatorade, also well-tolerated.  APAP and salicylate levels not elevated.  LFTs not elevated. Hgb 6.2 .Marland Kitchen  2 PRBC ..  7.9.  MCV 65 Low iron 17.  Low iron sats 4%, ferritin 15.  Folate, B12 normal  Father passed away at age 39 from medullary thyroid cancer.  She does not use alcohol.     Past Medical History:  Diagnosis Date   ADHD (attention deficit hyperactivity disorder)    Anorexia nervosa with bulimia    Anxiety    Aspirin overdose 02/18/2012   Depression    Dysrhythmia    Electrolyte disturbance 2010, 2012   hypokalemia, hypochloremia.   Urinary tract infection    Varicella     Past Surgical History:  Procedure Laterality Date   COLONOSCOPY WITH ESOPHAGOGASTRODUODENOSCOPY (EGD) N/A 10/28/2013   Procedure: COLONOSCOPY WITH ESOPHAGOGASTRODUODENOSCOPY (EGD);  Surgeon: Jerene Bears, MD;  Location: [facility] ENDOSCOPY;  Service: Endoscopy;  Laterality: N/A;   GUM SURGERY     INDUCED ABORTION       Prior to Admission medications   Medication Sig Start Date End Date Taking? Authorizing Provider  buPROPion (WELLBUTRIN XL) 150 MG 24 hr tablet Take 150 mg by mouth daily. 01/26/22  Yes [provider]  calcium carbonate (TUMS - DOSED IN MG ELEMENTAL CALCIUM) 500 MG chewable tablet Chew 1-2 tablets by mouth 2 (two) times daily as needed for indigestion or heartburn.   Yes [provider]  escitalopram (LEXAPRO) 20 MG tablet Take 20 mg by mouth daily. 01/26/22  Yes [provider]  gabapentin (NEURONTIN) 300 MG capsule Take 1 capsule (300 mg total) by mouth 3 (three) times daily. 11/22/19  Yes Johnn Hai, MD  hydrOXYzine (VISTARIL) 50 MG capsule Take 100 mg by mouth at bedtime.   Yes [provider]  mirtazapine (REMERON) 15 MG tablet Take 15 mg by mouth at bedtime. 01/26/22  Yes [provider]  omeprazole (PRILOSEC) 20 MG capsule Take 1 capsule (20 mg total) by mouth 2 (two) times daily before a meal. Patient taking differently: Take 20 mg by mouth daily. 06/12/21  Yes Mesner, Corene Cornea, MD  carbamazepine (TEGRETOL) 100 MG chewable tablet Chew 1 tablet (100 mg total) by mouth 3 (three) times daily. Patient not taking: Reported on 02/10/2022 11/22/19   Johnn Hai, MD  sucralfate (CARAFATE) 1 GM/10ML suspension Take 10 mLs (1 g total) by mouth 4 (four) times daily -  with meals and at bedtime. Patient not taking: Reported on 02/10/2022 06/12/21   Mesner, Corene Cornea, MD    Scheduled Meds:  escitalopram  20 mg Oral Daily   gabapentin  300 mg Oral TID   hydrOXYzine  100 mg Oral QHS   mirtazapine  15 mg Oral QHS   pantoprazole  40 mg Oral Daily   Infusions:  PRN Meds: acetaminophen, calcium carbonate   Allergies as of 02/10/2022 - Review Complete 02/10/2022  Allergen Reaction Noted   Iron  05/09/2020    Family History  Problem Relation Age of Onset   Cancer Father    Depression Sister    Depression Paternal Aunt    Drug abuse Maternal Uncle         deceased   Drug abuse Paternal Aunt    Colon cancer Maternal Grandmother     Social History   Socioeconomic History   Marital status: Single    Spouse name: Not on file   Number of children: Not on file   Years of education: Not on file   Highest education level: Not on file  Occupational History   Occupation: Conservation officer, [employer]  Tobacco Use   Smoking status: Some Days    Packs/day: 0.50    Types: Cigarettes   Smokeless tobacco: Never  Vaping Use   Vaping Use: Every day   Substances: Nicotine  Substance and Sexual Activity   Alcohol use: Not Currently    Alcohol/week: 0.0 standard drinks   Drug use: Not Currently    Types: Methamphetamines, IV    Comment: use $40 twice weekly   Sexual activity: Yes    Partners: Male    Birth control/protection: None  Other Topics Concern   Not on file  Social History Narrative   Not on file   Social Determinants of Health   Financial Resource Strain: Not on file  Food Insecurity: Not on file  Transportation Needs: Not on file  Physical Activity: Not on file  Stress: Not on file  Social Connections: Not on file  Intimate Partner Violence: Not on file    REVIEW OF SYSTEMS: Constitutional: Some fatigue and weakness but this is not profound. ENT:  No nose bleeds Pulm: No dyspnea or cough. CV:  No angina.  Occasionally notes rapid heart rate with exertion GU:  No hematuria, no frequency GI: See HPI. Heme: Denies excessive or unusual bleeding or bruising. Transfusions: See HPI. Neuro:  No peripheral tingling or numbness.  No seizures.  No syncope. Derm:  No itching, no rash or sores.  Endocrine:  No sweats or chills.  No polyuria or dysuria Immunization: Reviewed.  No recent vaccinations listed. Travel:  None beyond local counties in last few months.    PHYSICAL EXAM: Vital signs in last 24 hours: Vitals:   02/11/22 0413 02/11/22 0911  BP: (!) 111/48 117/71  Pulse: (!) 57 97  Resp: 17 18  Temp: 98 F (36.7 C)  98.9 F (37.2 C)  SpO2: 96% 96%   Wt Readings from Last 3 Encounters:  02/11/22 76.9 kg  06/12/21 83.9 kg  05/09/20 81.6 kg    General: Pale, alert, comfortable, somewhat anxious. Head: No facial asymmetry or swelling.  No signs of head trauma. Eyes: No scleral icterus or conjunctival pallor.  EOMI. Ears: No hearing deficit Nose: No congestion or discharge. Mouth: Good dentition.  Tongue midline.  Oropharynx clear without erythema, lesions or exudates. Neck: No JVD, no masses, no thyromegaly Lungs: Clear bilaterally.  No labored breathing or cough Heart: RRR.  No MRG.  S1, S2 present Abdomen: Soft.  Not tender, not distended.  No HSM, masses, bruits, hernias..   Rectal: Deferred. Musc/Skeltl: No joint redness, swelling or gross deformity. Extremities: No CCE.  Feet are warm with brisk reperfusion. Neurologic: Alert.  Oriented x3.  Able to provide excellent history.  Moves all 4 limbs without tremor.  No gross deficits. Skin: No telangiectasia, rash, sores, self-inflicted wounds. Nodes: No cervical adenopathy Psych: Animated, somewhat pressured speech.  Appropriate.  Cooperative.  Intake/Output from previous day: 02/22 0701 - 02/23 0700 In: 2702 [P.O.:920; I.V.:250; Blood:533; IV Piggyback:999] Out: 0  Intake/Output this shift: Total I/O In: 300 [P.O.:300] Out: -   LAB RESULTS: Recent Labs    02/10/22 0624 02/10/22 2048 02/11/22 0501  WBC 8.7  --  7.2  HGB 6.2* 7.4* 7.9*  HCT 21.8* 24.4* 26.3*  PLT 507*  --  463*   BMET Lab Results  Component Value Date   NA 135 02/10/2022   NA 139 06/12/2021   NA 139 05/10/2020   K 3.8 02/10/2022   K 3.5 06/12/2021   K 3.7 05/10/2020   CL 105 02/10/2022   CL 106 06/12/2021   CL 109 05/10/2020   CO2 24 02/10/2022   CO2 23 06/12/2021   CO2  27 05/10/2020   GLUCOSE 115 (H) 02/10/2022   GLUCOSE 112 (H) 06/12/2021   GLUCOSE 102 (H) 05/10/2020   BUN 17 02/10/2022   BUN 15 06/12/2021   BUN 10 05/10/2020   CREATININE 0.79  02/10/2022   CREATININE 0.85 06/12/2021   CREATININE 0.87 05/10/2020   CALCIUM 8.4 (L) 02/10/2022   CALCIUM 8.6 (L) 06/12/2021   CALCIUM 8.4 (L) 05/10/2020   LFT Recent Labs    02/10/22 0624  PROT 6.1*  ALBUMIN 3.0*  AST 23  ALT 18  ALKPHOS 47  BILITOT 0.1*   PT/INR Lab Results  Component Value Date   INR 0.99 10/26/2013   INR 0.96 10/08/2010   Hepatitis Panel No results for input(s): HEPBSAG, HCVAB, HEPAIGM, HEPBIGM in the last 72 hours. C-Diff No components found for: CDIFF Lipase     Component Value Date/Time   LIPASE 38 06/12/2021 0542    Drugs of Abuse     Component Value Date/Time   LABOPIA NONE DETECTED 02/10/2022 1047   COCAINSCRNUR NONE DETECTED 02/10/2022 [place] 10/09/2010 1631   LABBENZ NONE DETECTED 02/10/2022 1047   LABBENZ NEGATIVE 10/09/2010 1631   AMPHETMU NONE DETECTED 02/10/2022 1047   THCU NONE DETECTED 02/10/2022 1047   LABBARB NONE DETECTED 02/10/2022 1047     RADIOLOGY STUDIES: No results found.    IMPRESSION:   Recurrent iron deficiency anemia.  Has not been taking iron supplements.  Good response to transfused PRBCs.  History of same requiring blood transfusions on a couple of occasions in 2014, 2021.  GERD.  Severe reflux esophagitis in 2014 that had improved on relook endoscopy after therapy with PPI.  Multiple GERD related complaints.  Patient had not been taking PPI until about a week ago so suspect she has recurrence of esophagitis.  Eating disorder with nearly daily self-induced vomiting.  Tylenol overuse for short period of time due to dental pain from wisdom teeth.  LFTs normal.  APAP, salicylate levels normal.  Has not required treatment with acetylcysteine.      PLAN:     EGD planned for 330 tomorrow afternoon.  Patient can have clear liquid breakfast but n.p.o. after that.  Leave Protonix 40 mg in place for now.  After EGD can decide if bid ppi needed or carafate needed.     Danielle Prince   02/11/2022, 3:21 PM Phone [phone]   Attending physician's note   I have taken history, reviewed the chart and examined the patient. I performed a substantive portion of this encounter, including complete performance of at least one of the key components, in conjunction with the APP. I agree with the Advanced Practitioner's note, impression and recommendations.   Recurrent IDA likely d/t UGI etiology + nutritional. Hb 6.2 s/p 2U to 7.9. Neg colon 2014.  GERD with erosive esophagitis/HH on EGD 2014 and 2015. Now with intermittent dysphagia. Neg SB Bx for celiac.  Eating disorder-bulimia/anorexia nervosa with daily self-induced vomiting. With protein calorie malnutrition.  Noncompliance with meds/FU  Adm with Tylenol OD. LFTs Nl. No ETOH  Plan: -EGD with possible dil in AM -Agree with blood transfusion. Keep Hb > 7 -May benefit from IV iron -Long-term PPIs.  -Discussed compliance -FU with mental health/eating disorder clinic.  I have discussed the risks and benefits. The risks including rare risk of perforation, bleeding, risks of anesthesia/sedation. Alternatives were given. Patient is aware and agrees to proceed. All the questions were answered.   Carmell Austria, MD [facility] [phone]

## 2022-02-11 NOTE — Plan of Care (Signed)
Problem: Clinical Measurements: Goal: Will remain free from infection Outcome: Completed/Met Goal: Diagnostic test results will improve Outcome: Completed/Met Goal: Respiratory complications will improve Outcome: Completed/Met Goal: Cardiovascular complication will be avoided Outcome: Completed/Met   

## 2022-02-11 NOTE — Progress Notes (Addendum)
°  Transition of Care [facility]) Screening Note   Patient Details  Name: Danielle Prince Date of Birth: 1996-06-14   Transition of Care [facility]) CM/SW Contact:    Tom-Johnson, Hershal Coria, RN Phone Number: 02/11/2022, 3:28 PM  Patient is admitted for Symptomatic Anemia. From home with mother. States she took about 20 tabs of 500 mg Tylenol in one day for headache and abdominal pains. Has hx of  Bulimia Nervosa, Esophagitis, MDD, Polysubstance abuse, Bipolar disorder, Oppositional defiant disorder.Currently employed as a Engineer, water at [employer]. Independent with care and drive self prior to hospitalization. Does not have a PCP. Followup appointment with [facility] and information on AVS. They also put her on the waiting list if a date is available sooner. Uses [facility] pharmacy on [address] Transition of Care Department [facility]) has reviewed patient and no TOC recommendations noted at this time. TOC will continue to monitor patient advancement through interdisciplinary progression rounds. If new patient transition needs arise, please place a TOC consult.

## 2022-02-11 NOTE — H&P (View-Only) (Signed)
[facility] Consult: 3:21 PM 02/11/2022  LOS: 0 days    Referring Provider: Dr Johnnette Gourd  Primary Care Physician:  Pcp, No Primary Gastroenterologist:  Dr. Hilarie Fredrickson    Reason for Consultation:  anemia.  Reports of blood tinged emesis in patient with bulimia related purging, vomiting   HPI: Danielle Prince is a 26 y.o. female.  PMH ADHD.  Eating disorder with bulimia/anorexia nervosa.  Previous inpatient treatment for eating disorder.  Microcytic anemia w IDA in 2013.  Hgb 5.8 in 04/2020,  7 in 05/2021 patient takes omeprazole 20 mg daily.  Not on iron.  Previously prescribed Carafate but does not use this any longer..    Inpatient GI evaluation 10/2013 for Hb 6.8 with patient reported passing of blood with bowel movement. 10/2013 EGD.  For IDA.  There was grade D esophagitis, 4 cm hiatal hernia but otherwise normal study to second duodenum.  Twice daily PPI recommended along with repeat EGD in 8 to 12 months.  Small bowel pathology showed normal mucosa, no evidence of celiac disease. 10/2013 colonoscopy.  Small, nonbleeding external hemorrhoids.  Otherwise normal study to the terminal ileum.  Trial of Linzess added. 09/2014 EGD.  For reflux follow-up.  Class B esophagitis, irregular Z-line biopsied.  3 cm HH.  Otherwise normal to second duodenum.  Pathology showed mild inflammation at EG junction, no metaplasia, dysplasia, malignancy  Transfused PRBCs x 2 in 10/2013, x 2 04/2020, x 2 01/2022 (current admission).  Ferric gluconate infusion 10/2013, this is now listed as an allergy with reaction of SOB, anxiety, rash, cough.  Patient had not been taking any acid suppressing medication but about a week ago got a new prescription for omeprazole 40 mg daily.  She describes periodic reflux disease.  This is especially  worse after episodes of self-induced vomiting which occur daily to every other day.  Occasionally sees streaks of blood in the emesis but no large-volume hematemesis or coffee-ground emesis.  She feels a sense of choking at the level of the mid neck but food seems to pass okay.  Her throat feels sore.  She describes swelling on the left neck.  It fluctuates anywhere within a 10 pound range. Taking excessive amounts of acetaminophen yesterday (15 x 500 mg tablets over3 hours) for toothache.  Tooth ache and headache improved but she developed abdominal pain.  Concerned she may have overdosed the acetaminophen and presented to the ED.  Also had swelling around her eyes.  Developed there was concern of possible overdose.  Endorses fatigue, throat pain for past several months.  For the last 3 months she has had monthly menstrual periods and these are not excessively heavy.  Prior to 3 months ago she had amenorrhea for several months. Since his admission she has been tolerating solid food.  She drinks a lot of Gatorade, also well-tolerated.  APAP and salicylate levels not elevated.  LFTs not elevated. Hgb 6.2 .Marland Kitchen  2 PRBC ..  7.9.  MCV 65 Low iron 17.  Low iron sats 4%, ferritin 15.  Folate, B12 normal  Father passed away at age 7 from medullary thyroid cancer.  She does not use alcohol.     Past Medical History:  Diagnosis Date   ADHD (attention deficit hyperactivity disorder)    Anorexia nervosa with bulimia    Anxiety    Aspirin overdose 02/18/2012   Depression    Dysrhythmia    Electrolyte disturbance 2010, 2012   hypokalemia, hypochloremia.   Urinary tract infection    Varicella     Past Surgical History:  Procedure Laterality Date   COLONOSCOPY WITH ESOPHAGOGASTRODUODENOSCOPY (EGD) N/A 10/28/2013   Procedure: COLONOSCOPY WITH ESOPHAGOGASTRODUODENOSCOPY (EGD);  Surgeon: Jerene Bears, MD;  Location: [facility] ENDOSCOPY;  Service: Endoscopy;  Laterality: N/A;   GUM SURGERY     INDUCED ABORTION       Prior to Admission medications   Medication Sig Start Date End Date Taking? Authorizing Provider  buPROPion (WELLBUTRIN XL) 150 MG 24 hr tablet Take 150 mg by mouth daily. 01/26/22  Yes [provider]  calcium carbonate (TUMS - DOSED IN MG ELEMENTAL CALCIUM) 500 MG chewable tablet Chew 1-2 tablets by mouth 2 (two) times daily as needed for indigestion or heartburn.   Yes [provider]  escitalopram (LEXAPRO) 20 MG tablet Take 20 mg by mouth daily. 01/26/22  Yes [provider]  gabapentin (NEURONTIN) 300 MG capsule Take 1 capsule (300 mg total) by mouth 3 (three) times daily. 11/22/19  Yes Johnn Hai, MD  hydrOXYzine (VISTARIL) 50 MG capsule Take 100 mg by mouth at bedtime.   Yes [provider]  mirtazapine (REMERON) 15 MG tablet Take 15 mg by mouth at bedtime. 01/26/22  Yes [provider]  omeprazole (PRILOSEC) 20 MG capsule Take 1 capsule (20 mg total) by mouth 2 (two) times daily before a meal. Patient taking differently: Take 20 mg by mouth daily. 06/12/21  Yes Mesner, Corene Cornea, MD  carbamazepine (TEGRETOL) 100 MG chewable tablet Chew 1 tablet (100 mg total) by mouth 3 (three) times daily. Patient not taking: Reported on 02/10/2022 11/22/19   Johnn Hai, MD  sucralfate (CARAFATE) 1 GM/10ML suspension Take 10 mLs (1 g total) by mouth 4 (four) times daily -  with meals and at bedtime. Patient not taking: Reported on 02/10/2022 06/12/21   Mesner, Corene Cornea, MD    Scheduled Meds:  escitalopram  20 mg Oral Daily   gabapentin  300 mg Oral TID   hydrOXYzine  100 mg Oral QHS   mirtazapine  15 mg Oral QHS   pantoprazole  40 mg Oral Daily   Infusions:  PRN Meds: acetaminophen, calcium carbonate   Allergies as of 02/10/2022 - Review Complete 02/10/2022  Allergen Reaction Noted   Iron  05/09/2020    Family History  Problem Relation Age of Onset   Cancer Father    Depression Sister    Depression Paternal Aunt    Drug abuse Maternal Uncle         deceased   Drug abuse Paternal Aunt    Colon cancer Maternal Grandmother     Social History   Socioeconomic History   Marital status: Single    Spouse name: Not on file   Number of children: Not on file   Years of education: Not on file   Highest education level: Not on file  Occupational History   Occupation: Conservation officer, [employer]  Tobacco Use   Smoking status: Some Days    Packs/day: 0.50    Types: Cigarettes   Smokeless tobacco: Never  Vaping Use   Vaping Use: Every day   Substances: Nicotine  Substance and Sexual Activity   Alcohol use: Not Currently    Alcohol/week: 0.0 standard drinks   Drug use: Not Currently    Types: Methamphetamines, IV    Comment: use $40 twice weekly   Sexual activity: Yes    Partners: Male    Birth control/protection: None  Other Topics Concern   Not on file  Social History Narrative   Not on file   Social Determinants of Health   Financial Resource Strain: Not on file  Food Insecurity: Not on file  Transportation Needs: Not on file  Physical Activity: Not on file  Stress: Not on file  Social Connections: Not on file  Intimate Partner Violence: Not on file    REVIEW OF SYSTEMS: Constitutional: Some fatigue and weakness but this is not profound. ENT:  No nose bleeds Pulm: No dyspnea or cough. CV:  No angina.  Occasionally notes rapid heart rate with exertion GU:  No hematuria, no frequency GI: See HPI. Heme: Denies excessive or unusual bleeding or bruising. Transfusions: See HPI. Neuro:  No peripheral tingling or numbness.  No seizures.  No syncope. Derm:  No itching, no rash or sores.  Endocrine:  No sweats or chills.  No polyuria or dysuria Immunization: Reviewed.  No recent vaccinations listed. Travel:  None beyond local counties in last few months.    PHYSICAL EXAM: Vital signs in last 24 hours: Vitals:   02/11/22 0413 02/11/22 0911  BP: (!) 111/48 117/71  Pulse: (!) 57 97  Resp: 17 18  Temp: 98 F (36.7 C)  98.9 F (37.2 C)  SpO2: 96% 96%   Wt Readings from Last 3 Encounters:  02/11/22 76.9 kg  06/12/21 83.9 kg  05/09/20 81.6 kg    General: Pale, alert, comfortable, somewhat anxious. Head: No facial asymmetry or swelling.  No signs of head trauma. Eyes: No scleral icterus or conjunctival pallor.  EOMI. Ears: No hearing deficit Nose: No congestion or discharge. Mouth: Good dentition.  Tongue midline.  Oropharynx clear without erythema, lesions or exudates. Neck: No JVD, no masses, no thyromegaly Lungs: Clear bilaterally.  No labored breathing or cough Heart: RRR.  No MRG.  S1, S2 present Abdomen: Soft.  Not tender, not distended.  No HSM, masses, bruits, hernias..   Rectal: Deferred. Musc/Skeltl: No joint redness, swelling or gross deformity. Extremities: No CCE.  Feet are warm with brisk reperfusion. Neurologic: Alert.  Oriented x3.  Able to provide excellent history.  Moves all 4 limbs without tremor.  No gross deficits. Skin: No telangiectasia, rash, sores, self-inflicted wounds. Nodes: No cervical adenopathy Psych: Animated, somewhat pressured speech.  Appropriate.  Cooperative.  Intake/Output from previous day: 02/22 0701 - 02/23 0700 In: 2702 [P.O.:920; I.V.:250; Blood:533; IV Piggyback:999] Out: 0  Intake/Output this shift: Total I/O In: 300 [P.O.:300] Out: -   LAB RESULTS: Recent Labs    02/10/22 0624 02/10/22 2048 02/11/22 0501  WBC 8.7  --  7.2  HGB 6.2* 7.4* 7.9*  HCT 21.8* 24.4* 26.3*  PLT 507*  --  463*   BMET Lab Results  Component Value Date   NA 135 02/10/2022   NA 139 06/12/2021   NA 139 05/10/2020   K 3.8 02/10/2022   K 3.5 06/12/2021   K 3.7 05/10/2020   CL 105 02/10/2022   CL 106 06/12/2021   CL 109 05/10/2020   CO2 24 02/10/2022   CO2 23 06/12/2021   CO2  27 05/10/2020  ° GLUCOSE 115 (H) 02/10/2022  ° GLUCOSE 112 (H) 06/12/2021  ° GLUCOSE 102 (H) 05/10/2020  ° BUN 17 02/10/2022  ° BUN 15 06/12/2021  ° BUN 10 05/10/2020  ° CREATININE 0.79  02/10/2022  ° CREATININE 0.85 06/12/2021  ° CREATININE 0.87 05/10/2020  ° CALCIUM 8.4 (L) 02/10/2022  ° CALCIUM 8.6 (L) 06/12/2021  ° CALCIUM 8.4 (L) 05/10/2020  ° °LFT °Recent Labs  °  02/10/22 °0624  °PROT 6.1*  °ALBUMIN 3.0*  °AST 23  °ALT 18  °ALKPHOS 47  °BILITOT 0.1*  ° °PT/INR °Lab Results  °Component Value Date  ° INR 0.99 10/26/2013  ° INR 0.96 10/08/2010  ° °Hepatitis Panel °No results for input(s): HEPBSAG, HCVAB, HEPAIGM, HEPBIGM in the last 72 hours. °C-Diff °No components found for: CDIFF °Lipase  °   °Component Value Date/Time  ° LIPASE 38 06/12/2021 0542  ° ° °Drugs of Abuse  °   °Component Value Date/Time  ° LABOPIA NONE DETECTED 02/10/2022 1047  ° COCAINSCRNUR NONE DETECTED 02/10/2022 1047  ° COCAINSCRNUR NEGATIVE 10/09/2010 1631  ° LABBENZ NONE DETECTED 02/10/2022 1047  ° LABBENZ NEGATIVE 10/09/2010 1631  ° AMPHETMU NONE DETECTED 02/10/2022 1047  ° THCU NONE DETECTED 02/10/2022 1047  ° LABBARB NONE DETECTED 02/10/2022 1047  °  ° °RADIOLOGY STUDIES: °No results found. ° ° ° °IMPRESSION:  ° °Recurrent iron deficiency anemia.  Has not been taking iron supplements.  Good response to transfused PRBCs.  History of same requiring blood transfusions on a couple of occasions in 2014, 2021. ° °GERD.  Severe reflux esophagitis in 2014 that had improved on relook endoscopy after therapy with PPI.  Multiple GERD related complaints.  Patient had not been taking PPI until about a week ago so suspect she has recurrence of esophagitis. ° °Eating disorder with nearly daily self-induced vomiting. ° °Tylenol overuse for short period of time due to dental pain from wisdom teeth.  LFTs normal.  APAP, salicylate levels normal.  Has not required treatment with acetylcysteine.   ° ° ° °PLAN:  °  ° °EGD planned for 330 tomorrow afternoon.  Patient can have clear liquid breakfast but n.p.o. after that.  Leave Protonix 40 mg in place for now.  After EGD can decide if bid ppi needed or carafate needed.   ° ° °Sarah Gribbin   02/11/2022, 3:21 PM °Phone [phone] ° ° Attending physician's note  ° °I have taken history, reviewed the chart and examined the patient. I performed a substantive portion of this encounter, including complete performance of at least one of the key components, in conjunction with the APP. I agree with the Advanced Practitioner's note, impression and recommendations.  ° °Recurrent IDA likely d/t UGI etiology + nutritional. Hb 6.2 s/p 2U to 7.9. Neg colon 2014. ° °GERD with erosive esophagitis/HH on EGD 2014 and 2015. Now with intermittent dysphagia. Neg SB Bx for celiac. ° °Eating disorder-bulimia/anorexia nervosa with daily self-induced vomiting. With protein calorie malnutrition. ° °Noncompliance with meds/FU ° °Adm with Tylenol OD. LFTs Nl. No ETOH ° °Plan: °-EGD with possible dil in AM °-Agree with blood transfusion. Keep Hb > 7 °-May benefit from IV iron °-Long-term PPIs.  °-Discussed compliance °-FU with mental health/eating disorder clinic. ° °I have discussed the risks and benefits. The risks including rare risk of perforation, bleeding, risks of anesthesia/sedation. Alternatives were given. Patient is aware and agrees to proceed. All the questions were answered. ° ° °Raj Shiara Mcgough, MD °[facility] °[phone] ° °   °

## 2022-02-11 NOTE — Progress Notes (Signed)
HD#0 SUBJECTIVE:  Patient Summary: Danielle Prince is a 26 y.o. with a pertinent PMH of iron deficiency anemia, bulimia nervosa, esophagitis, MDD, polysubstance abuse, bipolar disorder, oppositional defiant disorder, who presented with concern for Tylenol overdose and admitted for symptomatic anemia.   Overnight Events: None  Interim History: Patient states that she feels okay this morning. She was very informative regarding anemia and bulimia histories. No current complaints.  OBJECTIVE:  Vital Signs: Vitals:   02/10/22 2020 02/11/22 0021 02/11/22 0413 02/11/22 0911  BP:  (!) 110/53 (!) 111/48 117/71  Pulse:  72 (!) 57 97  Resp:  17 17 18   Temp:  98.6 F (37 C) 98 F (36.7 C) 98.9 F (37.2 C)  TempSrc:  Oral Oral Oral  SpO2:  93% 96% 96%  Weight:   76.9 kg   Height: 5\' 10"  (1.778 m)      Supplemental O2: Room Air SpO2: 96 %  Filed Weights   02/11/22 0413  Weight: 76.9 kg     Intake/Output Summary (Last 24 hours) at 02/11/2022 1136 Last data filed at 02/11/2022 0700 Gross per 24 hour  Intake 2702 ml  Output 0 ml  Net 2702 ml   Net IO Since Admission: 2,702 mL [02/11/22 1136]  Physical Exam: Constitutional: Resting in bed, no acute distress. HENT:No dental abscesses noted. No increased erythema of pharynx or edema.  Cardio:Regular rate and rhythm. No murmurs, rubs, gallops. Pulm:Clear to auscultation bilaterally. Normal work of breathing on room air. Abdomen:Soft, nontender, nondistended. 03[birth year]-1992 for extremity edema. Skin:Warm and dry. Neuro:Alert and oriented x3. No focal deficit noted. Psych:Normal mood and affect.  Patient Lines/Drains/Airways Status     Active Line/Drains/Airways     Name Placement date Placement time Site Days   Peripheral IV 02/10/22 22 G 1.75" Anterior;Right Forearm 02/10/22  0920  Forearm  1             ASSESSMENT/PLAN:  Assessment: Principal Problem:   Symptomatic anemia Active Problems:   MDD (major depressive  disorder), recurrent episode (HCC)   Fatigue   Microcytic anemia   Plan: Symptomatic microcytic anemia Hx esophagitis Patient initially presented to ED after taking 15 to 20 500 mg Tylenol for severe tooth pain.  She was found to have a hemoglobin of 6.2 at that time.  She has since received 2 units PRBC with most recent hemoglobin of 7.9.  Iron studies were collected which revealed low iron at 17, low ferritin at 15, normal absolute reticulocyte count.  When questioned further about her bulimia history she states that in 2015 she had an endoscopy which revealed a bleeding esophageal ulcer.  She admits that she was lost to follow-up.  At this time she reports purging via vomiting roughly every other day and that when she did vomit as recently as 2 days ago she noticed light pink blood.  Chart review reveals that the patient underwent upper endoscopy which showed severe esophagitis felt secondary to reflux and vomiting from bulimia.  She later followed up in October 2015 and at that time Dr. 2016 recommended repeat upper endoscopy to rule out Barrett's esophagitis but I do not see that this was done.  Her FDLMP was 1 week ago and she reports her menstrual cycles as being normal and not heavy.  She changes her menstrual products every 2 hours when she uses the restroom, not necessarily because they are full.  She reports that when she was living in November 2015 her physician recommended that she get iron infusions  because there was concerned that she had a malabsorption syndrome.  She believes that she was diagnosed with IBS-D, however no other malabsorptive disorders.  At this time her calculated iron deficit after 2 units PRBC is around 300.  She states that during her last iron infusion she had allergic reaction. -Check magnesium, phosphate -GI consult -Iron supplementation -Trend CBC, transfuse Hgb less than 7 -Protonix for hx esophagitis/GERD   Difficulty swallowing Tooth pain Patient explains  that she feels that her wisdom teeth are impacted and that the reason she took some a Tylenol 3 days prior to coming to the hospital was due to the pain.  She states that she knows that she needs dental work.  Whenever she feels that she is having difficulty swallowing she describes it as in her throat and that sometimes things get stuck, solid food and fluid.  She sometimes forces herself to regurgitate the food due to the discomfort/sensation. Previous upper endoscopies have not revealed a cause of dysphagia. -CTM   Thrombocytosis Chronic. AM labs revealed 463. -Trend CBC   Bulimia Nervosa MDD, bipolar disorder, oppositional defiant disorder Hx of SI Chronically managed with bupropion, lexapro, hydroxyzine, mirtazapine. In light of her ongoing bulimia nervosa and risk for electrolyte derangements, and the decreased seizure threshold by Wellbutrin, we will not continue this medication during inpatient as this combination further increases risk of seizure.  -Hold Wellbutrin  Best Practice: Diet: Regular diet IVF: Fluids: IV push only, no IV fluids VTE: SCDs Start: 02/10/22 1035 Code: Full AB: None DISPO: Anticipated discharge in 1-3 days to Home pending Medical stability.  Signature: Champ Mungo, D.O.  Internal Medicine Resident, PGY-1 [facility]  Pager: [phone] 11:36 AM, 02/11/2022   Please contact the on call pager after 5 pm and on weekends at [phone].

## 2022-02-12 ENCOUNTER — Inpatient Hospital Stay (HOSPITAL_COMMUNITY): Payer: BLUE CROSS/BLUE SHIELD | Admitting: Registered Nurse

## 2022-02-12 ENCOUNTER — Encounter (HOSPITAL_COMMUNITY): Payer: Self-pay | Admitting: Internal Medicine

## 2022-02-12 ENCOUNTER — Encounter (HOSPITAL_COMMUNITY)
Admission: EM | Disposition: A | Discharge status: Home / Self Care | Payer: Self-pay | Source: Home / Self Care | Attending: Internal Medicine

## 2022-02-12 DIAGNOSIS — F502 Bulimia nervosa: Secondary | ICD-10-CM | POA: Diagnosis present

## 2022-02-12 DIAGNOSIS — K449 Diaphragmatic hernia without obstruction or gangrene: Secondary | ICD-10-CM

## 2022-02-12 DIAGNOSIS — T391X1A Poisoning by 4-Aminophenol derivatives, accidental (unintentional), initial encounter: Secondary | ICD-10-CM | POA: Diagnosis present

## 2022-02-12 DIAGNOSIS — R519 Headache, unspecified: Secondary | ICD-10-CM | POA: Diagnosis present

## 2022-02-12 DIAGNOSIS — Z20822 Contact with and (suspected) exposure to covid-19: Secondary | ICD-10-CM | POA: Diagnosis present

## 2022-02-12 DIAGNOSIS — R1310 Dysphagia, unspecified: Secondary | ICD-10-CM | POA: Diagnosis present

## 2022-02-12 DIAGNOSIS — F319 Bipolar disorder, unspecified: Secondary | ICD-10-CM

## 2022-02-12 DIAGNOSIS — D75839 Thrombocytosis, unspecified: Secondary | ICD-10-CM | POA: Diagnosis present

## 2022-02-12 DIAGNOSIS — E46 Unspecified protein-calorie malnutrition: Secondary | ICD-10-CM | POA: Diagnosis present

## 2022-02-12 DIAGNOSIS — K0889 Other specified disorders of teeth and supporting structures: Secondary | ICD-10-CM | POA: Diagnosis present

## 2022-02-12 DIAGNOSIS — K2211 Ulcer of esophagus with bleeding: Secondary | ICD-10-CM | POA: Diagnosis present

## 2022-02-12 DIAGNOSIS — D509 Iron deficiency anemia, unspecified: Secondary | ICD-10-CM | POA: Diagnosis present

## 2022-02-12 DIAGNOSIS — F913 Oppositional defiant disorder: Secondary | ICD-10-CM | POA: Diagnosis present

## 2022-02-12 DIAGNOSIS — F5000 Anorexia nervosa, unspecified: Secondary | ICD-10-CM | POA: Diagnosis present

## 2022-02-12 DIAGNOSIS — K2100 Gastro-esophageal reflux disease with esophagitis, without bleeding: Secondary | ICD-10-CM | POA: Diagnosis not present

## 2022-02-12 DIAGNOSIS — D649 Anemia, unspecified: Secondary | ICD-10-CM | POA: Diagnosis not present

## 2022-02-12 DIAGNOSIS — Z79899 Other long term (current) drug therapy: Secondary | ICD-10-CM | POA: Diagnosis not present

## 2022-02-12 DIAGNOSIS — Z818 Family history of other mental and behavioral disorders: Secondary | ICD-10-CM | POA: Diagnosis not present

## 2022-02-12 DIAGNOSIS — Z9114 Patient's other noncompliance with medication regimen: Secondary | ICD-10-CM | POA: Diagnosis not present

## 2022-02-12 DIAGNOSIS — Z800 Family history of malignant neoplasm of digestive organs: Secondary | ICD-10-CM | POA: Diagnosis not present

## 2022-02-12 DIAGNOSIS — Y92009 Unspecified place in unspecified non-institutional (private) residence as the place of occurrence of the external cause: Secondary | ICD-10-CM | POA: Diagnosis not present

## 2022-02-12 DIAGNOSIS — Z808 Family history of malignant neoplasm of other organs or systems: Secondary | ICD-10-CM | POA: Diagnosis not present

## 2022-02-12 DIAGNOSIS — Z8719 Personal history of other diseases of the digestive system: Secondary | ICD-10-CM | POA: Diagnosis not present

## 2022-02-12 DIAGNOSIS — F17210 Nicotine dependence, cigarettes, uncomplicated: Secondary | ICD-10-CM | POA: Diagnosis present

## 2022-02-12 HISTORY — PX: ESOPHAGOGASTRODUODENOSCOPY (EGD) WITH PROPOFOL: SHX5813

## 2022-02-12 HISTORY — PX: BIOPSY: SHX5522

## 2022-02-12 LAB — CBC
HCT: 29.8 % — ABNORMAL LOW (ref 36.0–46.0)
Hemoglobin: 9 g/dL — ABNORMAL LOW (ref 12.0–15.0)
MCH: 20.1 pg — ABNORMAL LOW (ref 26.0–34.0)
MCHC: 30.2 g/dL (ref 30.0–36.0)
MCV: 66.7 fL — ABNORMAL LOW (ref 80.0–100.0)
Platelets: 597 10*3/uL — ABNORMAL HIGH (ref 150–400)
RBC: 4.47 MIL/uL (ref 3.87–5.11)
RDW: 24.3 % — ABNORMAL HIGH (ref 11.5–15.5)
WBC: 6.3 10*3/uL (ref 4.0–10.5)
nRBC: 0 % (ref 0.0–0.2)

## 2022-02-12 LAB — BASIC METABOLIC PANEL
Anion gap: 9 (ref 5–15)
BUN: 14 mg/dL (ref 6–20)
CO2: 22 mmol/L (ref 22–32)
Calcium: 9.2 mg/dL (ref 8.9–10.3)
Chloride: 106 mmol/L (ref 98–111)
Creatinine, Ser: 0.75 mg/dL (ref 0.44–1.00)
GFR, Estimated: >60 mL/min (ref >60)
Glucose, Bld: 170 mg/dL — ABNORMAL HIGH (ref 70–99)
Potassium: 4.3 mmol/L (ref 3.5–5.1)
Sodium: 137 mmol/L (ref 135–145)

## 2022-02-12 SURGERY — ESOPHAGOGASTRODUODENOSCOPY (EGD) WITH PROPOFOL
Anesthesia: Monitor Anesthesia Care

## 2022-02-12 MED ORDER — PROPOFOL 500 MG/50ML IV EMUL
INTRAVENOUS | Status: DC | PRN
  Administered 2022-02-12: 500 ug/kg/min via INTRAVENOUS

## 2022-02-12 MED ORDER — LIDOCAINE 2% (20 MG/ML) 5 ML SYRINGE
INTRAMUSCULAR | Status: DC | PRN
Start: 2022-02-12 — End: 2022-02-12
  Administered 2022-02-12: 100 mg via INTRAVENOUS

## 2022-02-12 MED ORDER — SUCRALFATE 1 GM/10ML PO SUSP: 1.0000 g | Freq: Four times a day (QID) | ORAL | 0 refills | Status: DC

## 2022-02-12 MED ORDER — OMEPRAZOLE 20 MG PO CPDR: 20.0000 mg | DELAYED_RELEASE_CAPSULE | Freq: Two times a day (BID) | ORAL | 11 refills | Status: AC

## 2022-02-12 MED ORDER — DEXMEDETOMIDINE (PRECEDEX) IN NS 20 MCG/5ML (4 MCG/ML) IV SYRINGE
PREFILLED_SYRINGE | INTRAVENOUS | Status: DC | PRN
  Administered 2022-02-12 (×2): 8 ug via INTRAVENOUS

## 2022-02-12 MED ORDER — PROPOFOL 10 MG/ML IV BOLUS
INTRAVENOUS | Status: DC | PRN
  Administered 2022-02-12 (×3): 100 mg via INTRAVENOUS

## 2022-02-12 SURGICAL SUPPLY — 15 items

## 2022-02-12 NOTE — Interval H&P Note (Signed)
History and Physical Interval Note:  02/12/2022 2:59 PM  Danielle Prince  has presented today for surgery, with the diagnosis of Self-induced nausea, vomiting.  Recurrent anemia requiring blood transfusion.  The various methods of treatment have been discussed with the patient and family. After consideration of risks, benefits and other options for treatment, the patient has consented to  Procedure(s): ESOPHAGOGASTRODUODENOSCOPY (EGD) WITH PROPOFOL (N/A) BALLOON DILATION (N/A) as a surgical intervention.  The patient's history has been reviewed, patient examined, no change in status, stable for surgery.  I have reviewed the patient's chart and labs.  Questions were answered to the patient's satisfaction.     Lynann Bologna

## 2022-02-12 NOTE — Op Note (Signed)
[facility] Patient Name: Danielle Prince Procedure Date : 02/12/2022 MRN: [id number] Attending MD: Lynann Bologna , MD Date of Birth: 1996/08/19 CSN: [id number] Age: 26 Admit Type: Inpatient Procedure:                Upper GI endoscopy Indications:              1. Recurrent IDA 2. GERD with history of                            esophagitis in past. Now with intermittent                            dysphagia. Prev SB Bx neg for celiac. Providers:                Lynann Bologna, MD, Vicki Mallet, RN, Priscella Mann,                            Technician Referring MD:              Medicines:                Monitored Anesthesia Care Complications:            No immediate complications. Estimated Blood Loss:     Estimated blood loss: none. Procedure:                Pre-Anesthesia Assessment:                           - Prior to the procedure, a History and Physical                            was performed, and patient medications and                            allergies were reviewed. The patient's tolerance of                            previous anesthesia was also reviewed. The risks                            and benefits of the procedure and the sedation                            options and risks were discussed with the patient.                            All questions were answered, and informed consent                            was obtained. Prior Anticoagulants: The patient has                            taken no previous anticoagulant or antiplatelet  agents. ASA Grade Assessment: II - A patient with                            mild systemic disease. After reviewing the risks                            and benefits, the patient was deemed in                            satisfactory condition to undergo the procedure.                           After obtaining informed consent, the endoscope was                            passed under direct  vision. Throughout the                            procedure, the patient's blood pressure, pulse, and                            oxygen saturations were monitored continuously. The                            [id number]-[id number] ([id number] Olympus endoscope was introduced                            through the mouth, and advanced to the second part                            of duodenum. The upper GI endoscopy was                            accomplished without difficulty. The patient                            tolerated the procedure well. Scope In: Scope Out: Findings:      LA Grade D (one or more mucosal breaks involving at least 75% of       esophageal circumference) esophagitis with no bleeding was found 26 to       34 cm from the incisors. Biopsies were taken with a cold forceps for       histology. No strictures were noted. Due to severity of esophagitis, we       decided to hold off on empiric dilatation.      A 6 cm hiatal hernia was present.      The entire examined stomach was normal. Biopsies were taken with a cold       forceps for histology.      The examined duodenum was normal. Impression:               - LA Grade D reflux esophagitis with no bleeding.                            Biopsied.                           -  6 cm hiatal hernia.                           - Normal stomach. Biopsied. Recommendation:           - Resume previous diet.                           - Use Protonix (pantoprazole) 40 mg PO BID for 8                            weeks, then QD indefinitely.                           - Use sucralfate suspension 1 gram PO QID for 2                            weeks.                           - FU in GI clinic in 6-8 weeks. She will need                            repeat EGD with Bx (and dil if any further                            dysphagia) in 6-12 months.                           - Recommend appointment at eating disorder clinic.                           - The findings  and recommendations were discussed                            with the patient's family. Procedure Code(s):        --- Professional ---                           [phone], Esophagogastroduodenoscopy, flexible,                            transoral; with biopsy, single or multiple Diagnosis Code(s):        --- Professional ---                           K21.00, Gastro-esophageal reflux disease with                            esophagitis, without bleeding                           K44.9, Diaphragmatic hernia without obstruction or                            gangrene  R10.13, Epigastric pain CPT copyright 2019 [employer]. All rights reserved. The codes documented in this report are preliminary and upon coder review may  be revised to meet current compliance requirements. Lynann Bologna, MD 02/12/2022 3:43:44 PM This report has been signed electronically. Number of Addenda: 0

## 2022-02-12 NOTE — Anesthesia Preprocedure Evaluation (Signed)
Anesthesia Evaluation  Patient identified by MRN, date of birth, ID band Patient awake    Reviewed: Allergy & Precautions, NPO status , Patient's Chart, lab work & pertinent test results  Airway Mallampati: I       Dental no notable dental hx.    Pulmonary neg pulmonary ROS, Current Smoker and Patient abstained from smoking.,    Pulmonary exam normal        Cardiovascular negative cardio ROS Normal cardiovascular exam     Neuro/Psych PSYCHIATRIC DISORDERS Anxiety Depression Bipolar Disorder negative neurological ROS     GI/Hepatic GERD  Medicated,  Endo/Other    Renal/GU   negative genitourinary   Musculoskeletal negative musculoskeletal ROS (+)   Abdominal Normal abdominal exam  (+)   Peds  Hematology  (+) Blood dyscrasia, anemia ,   Anesthesia Other Findings   Reproductive/Obstetrics                             Anesthesia Physical Anesthesia Plan  ASA: 2  Anesthesia Plan: MAC   Post-op Pain Management: Minimal or no pain anticipated   Induction: Intravenous  PONV Risk Score and Plan: Treatment may vary due to age or medical condition, Propofol infusion and TIVA  Airway Management Planned: Natural Airway and Mask  Additional Equipment: None  Intra-op Plan:   Post-operative Plan:   Informed Consent: I have reviewed the patients History and Physical, chart, labs and discussed the procedure including the risks, benefits and alternatives for the proposed anesthesia with the patient or authorized representative who has indicated his/her understanding and acceptance.       Plan Discussed with: CRNA  Anesthesia Plan Comments:         Anesthesia Quick Evaluation

## 2022-02-12 NOTE — Progress Notes (Addendum)
° ° °  HD#0 SUBJECTIVE:  Patient Summary: Danielle Prince is a 26 y.o. with a pertinent PMH of iron deficiency anemia, bulimia nervosa, esophagitis, MDD, polysubstance abuse, bipolar disorder, oppositional defiant disorder, who presented with concern for Tylenol overdose and admitted for symptomatic anemia.   Overnight Events: None  Interim History: Patient feels well at this time. She is happy to hear that her hemoglobin is increasing appropriately.  OBJECTIVE:  Vital Signs: Vitals:   02/11/22 0911 02/11/22 1625 02/11/22 2139 02/12/22 0631  BP: 117/71 113/69 112/66 107/69  Pulse: 97 99 95 73  Resp: 18 17 18 18   Temp: 98.9 F (37.2 C) 97.8 F (36.6 C) 98.2 F (36.8 C) 98.4 F (36.9 C)  TempSrc: Oral Oral Oral   SpO2: 96% 98% 98% 97%  Weight:      Height:       Supplemental O2: Room Air SpO2: 97 %  Filed Weights   02/11/22 0413  Weight: 76.9 kg     Intake/Output Summary (Last 24 hours) at 02/12/2022 0737 Last data filed at 02/12/2022 0600 Gross per 24 hour  Intake 1073.86 ml  Output 0 ml  Net 1073.86 ml   Net IO Since Admission: 3,775.86 mL [02/12/22 0737]  Physical Exam: Constitutional: Resting in bed, no acute distress. Cardio:Regular rate and rhythm. No murmurs, rubs, gallops. Pulm:Clear to auscultation bilaterally. Normal work of breathing on room air. [id number] for extremity edema. Skin:Warm and dry. Neuro:Alert and oriented x3. No focal deficit noted. Psych:Normal mood and affect.   Patient Lines/Drains/Airways Status     Active Line/Drains/Airways     Name Placement date Placement time Site Days   Peripheral IV 02/10/22 22 G 1.75" Anterior;Right Forearm 02/10/22  0920  Forearm  2             ASSESSMENT/PLAN:  Assessment: Principal Problem:   Symptomatic anemia Active Problems:   MDD (major depressive disorder), recurrent episode (HCC)   Fatigue   Microcytic anemia   Plan: Symptomatic iron deficiency anemia Hx esophagitis Hgb  improved to 9.0 from 7.9. S/p 2u PRBC, ferric carboxymaltose 750 mg 02/23. Iron deficit recalculated to be 1257 mg, which between 2u PRBC and ferric carboxymaltose has been repleted. -GI following, appreciate their assistance -EGD at 3:30 PM -F/u recommendations -Trend CBC, transfuse Hgb less than 7 -Protonix for hx esophagitis/GERD  Bulimia Nervosa MDD, bipolar disorder, oppositional defiant disorder Hx of SI Chronically managed with bupropion, lexapro, hydroxyzine, mirtazapine. Patient counseled on increased seizure risk with purging behavior while taking Wellbutrin which she has been told before. She states she intends to get a better grip of her health when she goes home. -Hold Wellbutrin 2/2 increased seizure risk -Patient will need to follow-up with mental health and eating disorder clinic at discharge   Difficulty swallowing Tooth pain -CTM   Thrombocytosis Chronic. Up to 597 this morning from 463 02/23. -Trend CBC  Best Practice: Diet: NPO before procedure IVF: Fluids: IV push only, no IV fluids VTE: SCDs Start: 02/10/22 1035 Code: Full AB: None DISPO: Anticipated discharge tomorrow to Home pending Medical stability and Pending surgery.  Signature: Farrel Gordon, D.O.  Internal Medicine Resident, PGY-1 [facility]  Pager: [phone] 7:37 AM, 02/12/2022   Please contact the on call pager after 5 pm and on weekends at [phone].

## 2022-02-12 NOTE — Discharge Summary (Signed)
Name: Danielle Prince MRN: [id number] DOB: 03-14-1996 26 y.o. PCP: Pcp, No  Date of Admission: 02/10/2022  5:59 AM Date of Discharge:  02/12/2022 Attending Physician: Dr. Angelia Mould  DISCHARGE DIAGNOSIS:  Primary Problem: Iron deficiency anemia   Hospital Problems: Principal Problem:   Iron deficiency anemia Active Problems:   Esophagitis   MDD (major depressive disorder), recurrent episode (HCC)   Fatigue   Microcytic anemia   Hiatal hernia    DISCHARGE MEDICATIONS:   Allergies as of 02/12/2022       Reactions   Sodium Ferric Gluconate [ferrous Gluconate] Shortness Of Breath, Anxiety, Rash, Cough   Reaction occurred in 2014 during Furlecit infusion. Patient received 125 mg Solu-medrol and symptoms resolved.         Medication List     TAKE these medications    buPROPion 150 MG 24 hr tablet Commonly known as: WELLBUTRIN XL Take 150 mg by mouth daily.   calcium carbonate 500 MG chewable tablet Commonly known as: TUMS - dosed in mg elemental calcium Chew 1-2 tablets by mouth 2 (two) times daily as needed for indigestion or heartburn.   carbamazepine 100 MG chewable tablet Commonly known as: TEGRETOL Chew 1 tablet (100 mg total) by mouth 3 (three) times daily.   escitalopram 20 MG tablet Commonly known as: LEXAPRO Take 20 mg by mouth daily.   gabapentin 300 MG capsule Commonly known as: NEURONTIN Take 1 capsule (300 mg total) by mouth 3 (three) times daily.   hydrOXYzine 50 MG capsule Commonly known as: VISTARIL Take 100 mg by mouth at bedtime.   mirtazapine 15 MG tablet Commonly known as: REMERON Take 15 mg by mouth at bedtime.   omeprazole 20 MG capsule Commonly known as: PRILOSEC Take 1 capsule (20 mg total) by mouth 2 (two) times daily before a meal. What changed: when to take this   sucralfate 1 GM/10ML suspension Commonly known as: Carafate Take 10 mLs (1 g total) by mouth 4 (four) times daily for 14 days. What changed: when to take  this        DISPOSITION AND FOLLOW-UP:  [name] was discharged from [facility] in Stable condition. At the hospital follow up visit please address:  Symptomatic anemia Thrombocytosis Recheck CBC for continued stability of hemoglobin and to trend platelet count. She had repletion of iron stores while inpatient but would benefit close monitoring and continued supplementation if deficiency persists.   Difficulty swallowing, tooth pain, GERD, hx esophagitis, hx bulimia nervosa Ensure patient is taking omeprazole and sucralfate as advised, and that she was able to schedule a follow-up appointment with GI in 6-8 weeks. Counsel patient on disordered eating and discuss referral to eating disorder clinic. Dental referral if she does not already have a dentist.   MDD, bipolar disorder, hx of SI Ensure patient is following with outpatient psychiatry and therapy. Wellbutrin should be used cautiously with her history of bulemia.    Follow-up Recommendations: Consults: Gastroenterology: please follow up in 6-8 weeks. Labs: CBC Studies: None Medications:   START taking: Omeprazole 20 mg twice daily for 8 weeks After 8 weeks, you will switch to once daily Sucralfate suspension 1 g four times daily for 2 weeks  Follow-up Appointments:  Follow-up Information     [name]. Schedule an appointment as soon as possible for a visit.   Why: Our clinic will call you to schedule an appointment. Contact information: [address] [place] [phone]  [name], MD. Schedule an appointment as soon as possible for a visit.   Specialties: Gastroenterology, Internal Medicine Why: In 6-8 weeks for follow-up. Contact information: [address] [place] [address] [phone]                 HOSPITAL COURSE:  Patient Summary: Symptomatic anemia Patient presented  with concerns of tylenol overdose and was found to have hemoglobin of 6.2. Rectal exam was unremarkable for hemorrhoids or bright red blood. She received 2 units PRBC with improvement of hemoglobin to 7.0>7.9. Iron studies were collected which revealed low iron at 17, low ferritin at 15, normal absolute reticulocyte count. She received iron supplementation with ferric carboxymaltose once. EGD remarkable for LA Grade D esophagitis without bleeding which was biopsied, a 6 cm hiatal hernia, and normal stomach.  Tylenol overdose  Acetaminophen level and liver enzymes normal on admission. No further management required.   Difficulty swallowing Tooth pain GERD Hx esophagitis Hx bulimia nervosa Patient endorsed several months of intermittent difficulty swallowing liquids and solids, feeling like her throat is swollen and tooth pain.  There were no signs or symptoms of infectious etiology.  EGD remarkable for LA Grade D esophagitis without bleeding which was biopsied, a 6 cm hiatal hernia, and normal stomach.   Thrombocytosis Chronic and stable.   MDD  Bipolar disorder History of SI History of bulimia nervosa Chronically managed with home lexapro, hydroxyzine, mirtazapine. Wellbutrin was held due to concerns for electrolyte abnormalities and increased risk of seizure.   DISCHARGE INSTRUCTIONS:   Discharge Instructions     Call MD for:  extreme fatigue   Complete by: As directed    Call MD for:  persistant dizziness or light-headedness   Complete by: As directed    Call MD for:  persistant nausea and vomiting   Complete by: As directed    Call MD for:  severe uncontrolled pain   Complete by: As directed    Diet - low sodium heart healthy   Complete by: As directed    Discharge instructions   Complete by: As directed    Ms. Danielle Prince,  It was a pleasure to care for you during your stay at [facility]. I am glad that you are feeling better! You will have a follow-up with GI in 6-8  weeks and will start taking omeprazole 20 mg twice daily for the next eight weeks, then once daily thereafter. You will also take sucralfate 1 g four times daily for the next two weeks.   As we discussed, taking wellbutrin can be dangerous with your history of purging. I strongly advise that you speak with your psychiatrist about the risks and benefits of this medication.   Our clinic will call you to schedule a follow-up appointment.  My best,  Dr. Marlou Sa   Increase activity slowly   Complete by: As directed        SUBJECTIVE:  Patient feels well at this time. She is happy to hear that her hemoglobin is increasing appropriately.  Patient counseled on increased seizure risk with purging behavior while taking Wellbutrin which she has been told before. She states she intends to get a better grip of her health when she goes home. Discharge Vitals:   BP (!) 95/57 (BP Location: Left Arm)    Pulse 71    Temp 97.6 F (36.4 C)    Resp 18    Ht 5\' 10"  (1.778 m)    Wt 76.9  kg    LMP 02/05/2022    SpO2 97%    BMI 24.33 kg/m   OBJECTIVE:  Constitutional: Resting in bed, no acute distress. Cardio:Regular rate and rhythm. No murmurs, rubs, gallops. Pulm:Clear to auscultation bilaterally. Normal work of breathing on room air. [id number] for extremity edema. Skin:Warm and dry. Neuro:Alert and oriented x3. No focal deficit noted. Psych:Normal mood and affect.   Pertinent Labs, Studies, and Procedures:  CBC Latest Ref Rng & Units 02/12/2022 02/11/2022 02/10/2022  WBC 4.0 - 10.5 K/uL 6.3 7.2 -  Hemoglobin 12.0 - 15.0 g/dL 9.0(L) 7.9(L) 7.4(L)  Hematocrit 36.0 - 46.0 % 29.8(L) 26.3(L) 24.4(L)  Platelets 150 - 400 K/uL 597(H) 463(H) -    CMP Latest Ref Rng & Units 02/12/2022 02/10/2022 06/12/2021  Glucose 70 - 99 mg/dL 170(H) 115(H) 112(H)  BUN 6 - 20 mg/dL 14 17 15   Creatinine 0.44 - 1.00 mg/dL 0.75 0.79 0.85  Sodium 135 - 145 mmol/L 137 135 139  Potassium 3.5 - 5.1 mmol/L 4.3 3.8 3.5  Chloride 98  - 111 mmol/L 106 105 106  CO2 22 - 32 mmol/L 22 24 23   Calcium 8.9 - 10.3 mg/dL 9.2 8.4(L) 8.6(L)  Total Protein 6.5 - 8.1 g/dL - 6.1(L) 6.1(L)  Total Bilirubin 0.3 - 1.2 mg/dL - 0.1(L) 0.2(L)  Alkaline Phos 38 - 126 U/L - 47 67  AST 15 - 41 U/L - 23 14(L)  ALT 0 - 44 U/L - 18 15    No results found.   Signed: Farrel Gordon, D.O.  Internal Medicine Resident, PGY-1 [facility]  Pager: [phone] 5:00 PM, 02/12/2022

## 2022-02-12 NOTE — Progress Notes (Signed)
Discharge instructions given to patient and patient's mother Danielle Prince.  Both verbalized understanding.  Encouraged to call the doctor for questions.  Discharged home.

## 2022-02-12 NOTE — Hospital Course (Addendum)
Symptomatic anemia Patient presented with concerns of tylenol overdose and was found to have hemoglobin of 6.2. Rectal exam was unremarkable for hemorrhoids or bright red blood. She received 2 units PRBC with improvement of hemoglobin to 7.0>7.9. Iron studies were collected which revealed low iron at 17, low ferritin at 15, normal absolute reticulocyte count. She received iron supplementation with ferric carboxymaltose once. EGD remarkable for LA Grade D esophagitis without bleeding which was biopsied, a 6 cm hiatal hernia, and normal stomach.  Tylenol overdose  Acetaminophen level and liver enzymes normal on admission. No further management required.   Difficulty swallowing Tooth pain GERD Hx esophagitis Hx bulimia nervosa Patient endorsed several months of intermittent difficulty swallowing liquids and solids, feeling like her throat is swollen and tooth pain.  There were no signs or symptoms of infectious etiology.  EGD remarkable for LA Grade D esophagitis without bleeding which was biopsied, a 6 cm hiatal hernia, and normal stomach.   Thrombocytosis Chronic and stable.   MDD  Bipolar disorder History of SI History of bulimia nervosa Chronically managed with home lexapro, hydroxyzine, mirtazapine. Wellbutrin was held due to concerns for electrolyte abnormalities and increased risk of seizure.

## 2022-02-12 NOTE — Transfer of Care (Signed)
Immediate Anesthesia Transfer of Care Note  Patient: Danielle Prince  Procedure(s) Performed: ESOPHAGOGASTRODUODENOSCOPY (EGD) WITH PROPOFOL BIOPSY  Patient Location: PACU  Anesthesia Type:MAC  Level of Consciousness: sedated  Airway & Oxygen Therapy: Patient Spontanous Breathing  Post-op Assessment: Report given to RN  Post vital signs: stable  Last Vitals:  Vitals Value Taken Time  BP 105/44 02/12/22 1529  Temp 36.5 C 02/12/22 1529  Pulse 72 02/12/22 1530  Resp 18 02/12/22 1530  SpO2 94 % 02/12/22 1530  Vitals shown include unvalidated device data.  Last Pain:  Vitals:   02/12/22 1529  TempSrc: Temporal  PainSc:          Complications: No notable events documented.

## 2022-02-13 NOTE — Anesthesia Postprocedure Evaluation (Signed)
Anesthesia Post Note  Patient: Danielle Prince  Procedure(s) Performed: ESOPHAGOGASTRODUODENOSCOPY (EGD) WITH PROPOFOL BIOPSY     Patient location during evaluation: Endoscopy Anesthesia Type: MAC Level of consciousness: awake Pain management: pain level controlled Vital Signs Assessment: post-procedure vital signs reviewed and stable Respiratory status: spontaneous breathing Cardiovascular status: stable Postop Assessment: no apparent nausea or vomiting Anesthetic complications: no   No notable events documented.  Last Vitals:  Vitals:   02/12/22 1556 02/12/22 1608  BP: (!) 104/39 (!) 95/57  Pulse: 78 71  Resp: 16 18  Temp:  36.4 C  SpO2: 97% 97%    Last Pain:  Vitals:   02/12/22 1556  TempSrc:   PainSc: 0-No pain                 Huston Foley

## 2022-02-14 ENCOUNTER — Encounter (HOSPITAL_COMMUNITY): Payer: Self-pay | Admitting: Gastroenterology

## 2022-02-16 ENCOUNTER — Telehealth: Payer: Self-pay

## 2022-02-16 NOTE — Telephone Encounter (Signed)
TOC HFU appointment scheduled for 02/22/2022 at 2:15 pm with Dr. Evlyn Kanner.  Just spoke with patient and she agreed to appointment date and time.

## 2022-02-16 NOTE — Telephone Encounter (Signed)
-----   Message from Belva Agee, MD sent at 02/12/2022  4:44 PM EST ----- Regarding: [facility] Follow Up Good Afternoon,   Can we please setup Ms. Prestigiacomo with a hospital follow up next week?   Thank you!  Dr. Kirtland Bouchard.

## 2022-02-17 LAB — SURGICAL PATHOLOGY

## 2022-02-21 ENCOUNTER — Encounter: Payer: Self-pay | Admitting: Gastroenterology

## 2022-02-22 ENCOUNTER — Other Ambulatory Visit: Payer: Self-pay

## 2022-02-22 ENCOUNTER — Inpatient Hospital Stay (HOSPITAL_COMMUNITY): Admit: 2022-02-22 | Payer: BLUE CROSS/BLUE SHIELD

## 2022-02-22 ENCOUNTER — Ambulatory Visit (INDEPENDENT_AMBULATORY_CARE_PROVIDER_SITE_OTHER): Payer: BLUE CROSS/BLUE SHIELD | Admitting: Student

## 2022-02-22 ENCOUNTER — Encounter: Payer: Self-pay | Admitting: Student

## 2022-02-22 VITALS — BP 127/77 | HR 101 | Temp 98.4°F | Wt 176.8 lb

## 2022-02-22 DIAGNOSIS — F3110 Bipolar disorder, current episode manic without psychotic features, unspecified: Secondary | ICD-10-CM

## 2022-02-22 DIAGNOSIS — Z23 Encounter for immunization: Secondary | ICD-10-CM | POA: Diagnosis not present

## 2022-02-22 DIAGNOSIS — D509 Iron deficiency anemia, unspecified: Secondary | ICD-10-CM

## 2022-02-22 DIAGNOSIS — D649 Anemia, unspecified: Secondary | ICD-10-CM | POA: Diagnosis not present

## 2022-02-22 DIAGNOSIS — Z124 Encounter for screening for malignant neoplasm of cervix: Secondary | ICD-10-CM | POA: Diagnosis not present

## 2022-02-22 DIAGNOSIS — Z113 Encounter for screening for infections with a predominantly sexual mode of transmission: Secondary | ICD-10-CM

## 2022-02-22 DIAGNOSIS — Z7689 Persons encountering health services in other specified circumstances: Secondary | ICD-10-CM

## 2022-02-22 DIAGNOSIS — K0381 Cracked tooth: Secondary | ICD-10-CM

## 2022-02-22 DIAGNOSIS — K208 Other esophagitis without bleeding: Secondary | ICD-10-CM

## 2022-02-22 DIAGNOSIS — Z0000 Encounter for general adult medical examination without abnormal findings: Secondary | ICD-10-CM

## 2022-02-22 MED ORDER — HPV 9-VALENT RECOMB VACCINE IM SUSP: 0.5000 mL | Freq: Once | INTRAMUSCULAR | 0 refills | Status: AC

## 2022-02-22 NOTE — Patient Instructions (Addendum)
[name], it was a pleasure seeing you today! ? ?Today we discussed: ?- Low blood count: I am going to do some lab work today to evaluate this. ? ?- You had a PAP smear done today. We will let you know the results. ? ?- In addition, we will get STI screenings. I will call you with the results. ? ?- Please make sure to follow-up with the psychiatrist and gastroenterologist as soon as possible. ? ?I have ordered the following labs today: ? ? ?Lab Orders    ?     HIV antibody (with reflex)    ?     CBC no Diff     ? ?Tests ordered today: ? ?PAP ? ? ?Follow-up: 6 months  ? ?Please make sure to arrive 15 minutes prior to your next appointment. If you arrive late, you may be asked to reschedule.  ? ?We look forward to seeing you next time. Please call our clinic at [phone] if you have any questions or concerns. The best time to call is Monday-Friday from 9am-4pm, but there is someone available 24/7. If after hours or the weekend, call the main hospital number and ask for the Internal Medicine Resident On-Call. If you need medication refills, please notify your pharmacy one week in advance and they will send [place] a request. ? ?Thank you for letting us take part in your care. Wishing you the best! ? ?Thank you, ?Evlyn Kanner, MD ?

## 2022-02-23 DIAGNOSIS — K0381 Cracked tooth: Secondary | ICD-10-CM | POA: Insufficient documentation

## 2022-02-23 DIAGNOSIS — Z7689 Persons encountering health services in other specified circumstances: Secondary | ICD-10-CM | POA: Insufficient documentation

## 2022-02-23 DIAGNOSIS — Z113 Encounter for screening for infections with a predominantly sexual mode of transmission: Secondary | ICD-10-CM | POA: Insufficient documentation

## 2022-02-23 DIAGNOSIS — Z0000 Encounter for general adult medical examination without abnormal findings: Secondary | ICD-10-CM | POA: Insufficient documentation

## 2022-02-23 LAB — CERVICOVAGINAL ANCILLARY ONLY
Bacterial Vaginitis (gardnerella): NEGATIVE
Candida Glabrata: NEGATIVE
Candida Vaginitis: NEGATIVE
Chlamydia: NEGATIVE
Comment: NEGATIVE
Comment: NEGATIVE
Comment: NEGATIVE
Comment: NEGATIVE
Comment: NEGATIVE
Comment: NORMAL
Neisseria Gonorrhea: NEGATIVE
Trichomonas: NEGATIVE

## 2022-02-23 LAB — CBC
Hematocrit: 34.6 % (ref 34.0–46.6)
Hemoglobin: 10.6 g/dL — ABNORMAL LOW (ref 11.1–15.9)
MCH: 22.9 pg — ABNORMAL LOW (ref 26.6–33.0)
MCHC: 30.6 g/dL — ABNORMAL LOW (ref 31.5–35.7)
MCV: 75 fL — ABNORMAL LOW (ref 79–97)
Platelets: 429 10*3/uL (ref 150–450)
RBC: 4.63 x10E6/uL (ref 3.77–5.28)
WBC: 10.1 10*3/uL (ref 3.4–10.8)

## 2022-02-23 LAB — HIV ANTIBODY (ROUTINE TESTING W REFLEX): HIV Screen 4th Generation wRfx: NONREACTIVE

## 2022-02-23 NOTE — Progress Notes (Signed)
? ?  CC: establish care, hospital follow-up ? ?HPI: ? ?[name] is a 26 y.o. person with medical history as below presenting to [facility] to establish care. ? ?Please see problem-based list for further details, assessments, and plans. ? ?Past Medical History:  ?Diagnosis Date  ? ADHD (attention deficit hyperactivity disorder)   ? Anorexia nervosa with bulimia   ? Anxiety   ? Aspirin overdose 02/18/2012  ? Depression   ? Dysrhythmia   ? Electrolyte disturbance 2010, 2012  ? hypokalemia, hypochloremia.  ? Urinary tract infection   ? Varicella   ? ?Review of Systems:  As per HPI ? ?Physical Exam: ? ?Vitals:  ? 02/22/22 1446  ?BP: 127/77  ?Pulse: (!) 101  ?Temp: 98.4 ?F (36.9 ?C)  ?TempSrc: Oral  ?SpO2: 98%  ?Weight: 176 lb 12.8 oz (80.2 kg)  ? ?General: Resting comfortably in chair in no acute distress ?HENT: Normocephalic, atraumatic. Mucous membranes moist. Poor dentition. Tonsils mildly enlarged, no exudates appreciated. Uvula midline. No cervical or supraclavicular lymphadenopathy. ?CV: Tachycardic, regular rhythm. No murmurs appreciated. Distal pulses 2+ bilaterally. ?Pulm: Normal respiratory effort on room air. Clear to ausculation bilaterally. ?GU: External genitalia normally developed without lesions. Vagina and cervix without lesions. White discharge present. ?MSK: Normal bulk, tone. No pitting edema bilateral lower extremities. ?Skin: Warm, dry. No rashes or lesions appreciated. ?Neuro: Awake, alert, conversing appropriately.  ?Psych: Anxious. Normal speech. ? ?Assessment & Plan:  ? ?See Encounters Tab for problem based charting. ? ?Patient discussed with Dr.  Cain Sieve ? ?

## 2022-02-23 NOTE — Assessment & Plan Note (Addendum)
During most recent hospitalization, patient underwent EGD with biopsies taken. This resulted in severe esophagitis with ulceration. Patient reportedly has not had self-induced vomiting for a few yearas per her report. She does report experiencing regular acid reflex, which likely is due to long-standing self-induced vomiting. Since discharge, patient reports her abdominal pain has been controlled with the medications given. Notes she has not had any episodes of self-induced vomiting in the last few months.  ? ?Patient to continue with two weeks of twice daily PPI therapy and will follow-up with GI. ? ?- Omeprazole 20mg  twice daily ?- Sucralfate 1g four times daily ?- Follow-up with GI ?

## 2022-02-23 NOTE — Assessment & Plan Note (Signed)
Patient reports she cracked her tooth after eating significant amount of ice due to picca. Patient was taking a large amount of Tylenol in order to ease the pain, which led to her going to the ED for concerns of Tylenol overdose. Patient mentions the tooth pain is currently controlled and has been taking much less Tylenol. She currently does not have a dentist and needs a referral.  ? ?On examination, patient does have poor dentition but no abscesses or acute inflammation present. Will place referral for dentistry. ? ?- Dentistry referral ?

## 2022-02-23 NOTE — Assessment & Plan Note (Signed)
During patient's recent hospitalization, patient found to be significantly anemic with Hgb 6.0 with iron deficiency present. Prior to discharge, she received 2u pRBCs and one dose of ferric carboxymaltose infusion.  ? ?Since discharge, patient states she has felt much better. Specifically mentions she does not feel as fatigued and has not experienced dyspnea. Does mention she continues to have picca, but much improved since discharge. Patient denies melena, bright red blood per rectum, hematochezia, hemoptysis, abdominal pain. Per chart review, there was concern for malabsorption 2/2 bulimia nervosa for cause of iron deficiency anemia. However, patient is not experiencing any malabsorption symptoms, including bloating, large bowel movements, steatorrhea, nausea, vomiting.  ? ?We will have patient follow-up with gastroenterology and repeat CBC today. Would repeat iron studies in 4-6 weeks. If continually low, would consider oral supplementation.  ? ?- CBC today ?- Follow-up with GI ?- Repeat iron studies in 4-6 weeks ?

## 2022-02-23 NOTE — Assessment & Plan Note (Signed)
Patient expresses that she would like to be tested for STI's today. Mentions that she has been sexually active since her last testing. Denies fevers, chills, dysuria, vaginal discharge, new lesions.  ? ?- GC, chlamydia, BV, trich per vaginal swab ?- HIV testing pending ?

## 2022-02-23 NOTE — Assessment & Plan Note (Signed)
Patient reports she recently decided to improve both her physical and mental health. She recently saw her psychiatrist last month, who made a few different medication changes. Danielle Prince reports she has not seen much of an effect since that time. She denies current suicidal ideation, homicidal ideation, or hallucinations.  ? ?Considered d/c'ing Wellbutrin given history of eating disorder, but patient has an appropriate BMI and has not displayed any signs of recurrence. Given complex mental health history, will defer to psychiatry for further medication changes. Will continue with current medications and have her follow-up with psychiatry. ? ?- Buproprion 150mg  daily ?- Escitalopram 20mg  daily ?- Hydroxyzine 100mg  daily ?- Mirtazapine 50mg  daily ?- Follow-up with psychiatry ?

## 2022-02-23 NOTE — Assessment & Plan Note (Signed)
Ms. Melonie Germani is presenting to [facility] today to establish care with the clinic. She states she recently decided to improve both of her mental and physical health and is ready to have a regular doctor. Reports never having primary care physician in the past.  ? ?Patient denies significant family history of cardiovascular disease, cancers, or autoimmune diseases.  ? ?Ms. Dehaas was in school until December and currently does not work. She is living with her mother and feels like she has a safe home environment. She can complete her ADL and IADL's independently without difficulty. She is able to independently manage her medications and her day-to-day routine. Patient previously smoked cigarettes until a few months ago. Prior to this patient had been smoking ~1ppd since she was 11 (~14 pack-years). Denies alcohol use. Previously used IV methamphetamine but has not used "in a long time."  ? ?Patient reports she believes she received a Tdap vaccine when she was in [place] years ago, but is unsure when this was. Also believes she received one dose of HPV vaccine when she was younger but did not another. Patient did have a PAP smear last in 2019 with normal results.  ? ?Ms. Shipp denies weight loss, night sweats, body aches, fevers, chills, chest pain, dyspnea, abdominal pain, nausea, vomiting, constipation, diarrhea, dysuria, focal weakness, numbness, or paresthesias.  ?

## 2022-02-23 NOTE — Assessment & Plan Note (Signed)
-   PAP smear today ?- Pt received one dose of HPV vaccine  ?

## 2022-02-24 NOTE — Progress Notes (Signed)
Internal Medicine Clinic Attending ? ?Case discussed with Dr. Braswell  At the time of the visit.  We reviewed the resident?s history and exam and pertinent patient test results.  I agree with the assessment, diagnosis, and plan of care documented in the resident?s note.  ?

## 2022-02-25 LAB — CYTOLOGY - PAP: Diagnosis: NEGATIVE

## 2022-02-27 ENCOUNTER — Other Ambulatory Visit: Payer: Self-pay

## 2022-02-27 ENCOUNTER — Emergency Department (HOSPITAL_COMMUNITY)
Admission: EM | Admit: 2022-02-27 | Discharge: 2022-03-02 | Disposition: A | Discharge status: Home / Self Care | Payer: BLUE CROSS/BLUE SHIELD | Attending: Emergency Medicine | Admitting: Emergency Medicine

## 2022-02-27 DIAGNOSIS — F332 Major depressive disorder, recurrent severe without psychotic features: Secondary | ICD-10-CM | POA: Diagnosis not present

## 2022-02-27 DIAGNOSIS — F3110 Bipolar disorder, current episode manic without psychotic features, unspecified: Secondary | ICD-10-CM | POA: Diagnosis present

## 2022-02-27 DIAGNOSIS — F411 Generalized anxiety disorder: Secondary | ICD-10-CM | POA: Insufficient documentation

## 2022-02-27 DIAGNOSIS — R000 Tachycardia, unspecified: Secondary | ICD-10-CM | POA: Insufficient documentation

## 2022-02-27 DIAGNOSIS — F15959 Other stimulant use, unspecified with stimulant-induced psychotic disorder, unspecified: Secondary | ICD-10-CM | POA: Diagnosis present

## 2022-02-27 DIAGNOSIS — R451 Restlessness and agitation: Secondary | ICD-10-CM

## 2022-02-27 DIAGNOSIS — R443 Hallucinations, unspecified: Secondary | ICD-10-CM | POA: Diagnosis present

## 2022-02-27 DIAGNOSIS — Z20822 Contact with and (suspected) exposure to covid-19: Secondary | ICD-10-CM | POA: Insufficient documentation

## 2022-02-27 DIAGNOSIS — F339 Major depressive disorder, recurrent, unspecified: Secondary | ICD-10-CM | POA: Diagnosis present

## 2022-02-27 DIAGNOSIS — F15159 Other stimulant abuse with stimulant-induced psychotic disorder, unspecified: Secondary | ICD-10-CM | POA: Insufficient documentation

## 2022-02-27 LAB — I-STAT BETA HCG BLOOD, ED (MC, WL, AP ONLY): I-stat hCG, quantitative: <5 m[IU]/mL (ref <5)

## 2022-02-27 LAB — ETHANOL: Alcohol, Ethyl (B): <10 mg/dL (ref <10)

## 2022-02-27 MED ORDER — ZIPRASIDONE MESYLATE 20 MG IM SOLR
INTRAMUSCULAR | Status: AC
  Filled 2022-02-27: qty 20

## 2022-02-27 MED ORDER — STERILE WATER FOR INJECTION IJ SOLN
INTRAMUSCULAR | Status: AC
  Administered 2022-02-27: 1 mL
  Filled 2022-02-27: qty 10

## 2022-02-27 MED ORDER — ZIPRASIDONE MESYLATE 20 MG IM SOLR
20.0000 mg | Freq: Once | INTRAMUSCULAR | Status: AC
  Administered 2022-02-27: 20 mg via INTRAMUSCULAR

## 2022-02-27 MED ORDER — LORAZEPAM 2 MG/ML IJ SOLN
INTRAMUSCULAR | Status: AC
  Filled 2022-02-27: qty 1

## 2022-02-27 NOTE — ED Notes (Signed)
Pt endorses AVH. ?

## 2022-02-27 NOTE — ED Triage Notes (Signed)
Pt BIB [employer], naked, acting erratically. Pt spitting, being aggressive, and laughing at inappropriate times. Pt restrained on arrival and given 20mg  IM geodon per verbal order from Dr. . ?

## 2022-02-28 ENCOUNTER — Encounter (HOSPITAL_COMMUNITY): Payer: Self-pay | Admitting: Emergency Medicine

## 2022-02-28 LAB — COMPREHENSIVE METABOLIC PANEL
ALT: 19 U/L (ref 0–44)
AST: 27 U/L (ref 15–41)
Albumin: 3.6 g/dL (ref 3.5–5.0)
Alkaline Phosphatase: 50 U/L (ref 38–126)
Anion gap: 12 (ref 5–15)
BUN: 16 mg/dL (ref 6–20)
CO2: 20 mmol/L — ABNORMAL LOW (ref 22–32)
Calcium: 9.1 mg/dL (ref 8.9–10.3)
Chloride: 102 mmol/L (ref 98–111)
Creatinine, Ser: 0.86 mg/dL (ref 0.44–1.00)
GFR, Estimated: >60 mL/min (ref >60)
Glucose, Bld: 108 mg/dL — ABNORMAL HIGH (ref 70–99)
Potassium: 3.1 mmol/L — ABNORMAL LOW (ref 3.5–5.1)
Sodium: 134 mmol/L — ABNORMAL LOW (ref 135–145)
Total Bilirubin: 0.3 mg/dL (ref 0.3–1.2)
Total Protein: 6.4 g/dL — ABNORMAL LOW (ref 6.5–8.1)

## 2022-02-28 LAB — CBC
HCT: 36.1 % (ref 36.0–46.0)
Hemoglobin: 11 g/dL — ABNORMAL LOW (ref 12.0–15.0)
MCH: 23.3 pg — ABNORMAL LOW (ref 26.0–34.0)
MCHC: 30.5 g/dL (ref 30.0–36.0)
MCV: 76.5 fL — ABNORMAL LOW (ref 80.0–100.0)
Platelets: 399 10*3/uL (ref 150–400)
RBC: 4.72 MIL/uL (ref 3.87–5.11)
WBC: 11.3 10*3/uL — ABNORMAL HIGH (ref 4.0–10.5)
nRBC: 0 % (ref 0.0–0.2)

## 2022-02-28 LAB — RAPID URINE DRUG SCREEN, HOSP PERFORMED
Amphetamines: NOT DETECTED
Barbiturates: NOT DETECTED
Benzodiazepines: POSITIVE — AB
Cocaine: NOT DETECTED
Opiates: NOT DETECTED
Tetrahydrocannabinol: NOT DETECTED

## 2022-02-28 LAB — RESP PANEL BY RT-PCR (FLU A&B, COVID) ARPGX2
Influenza A by PCR: NEGATIVE
Influenza B by PCR: NEGATIVE
SARS Coronavirus 2 by RT PCR: NEGATIVE

## 2022-02-28 NOTE — ED Notes (Signed)
Patient in her room laughing to self. ?

## 2022-02-28 NOTE — ED Notes (Signed)
Pt resting at this time, bilateral ankle restraints removed. ?

## 2022-02-28 NOTE — ED Notes (Signed)
Bilateral wrist restraints removed. Pt resting comfortably. ?

## 2022-02-28 NOTE — BH Assessment (Signed)
Mom provided a list of pt medications ? ?Gabapentin 300mg  TID ?Hydroxyzine 50mg  TID ?Lamotrigine 200mg  QD ?Escitalopram 5mg  QD ?Aripiprazole 15mg  QD ?Bupropion 150mg  QD ?
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
## 2022-02-28 NOTE — Progress Notes (Signed)
CSW spoke with [facility] with [facility]. It was reported that this patient will be reviewed for possible placement. ? ?Glennie Isle, MSW, LCSW-A, LCAS-A ?Phone: [phone] ?Disposition/TOC ? ?

## 2022-02-28 NOTE — ED Notes (Signed)
Pt on TTS call   

## 2022-02-28 NOTE — ED Notes (Signed)
Pt awake and ambulatory to restroom. Pt able to provider urine sample and cooperative with COVID swab ?TTS provider was called to inform them pt was awake, oriented and ready for intal consult  ?

## 2022-02-28 NOTE — Progress Notes (Signed)
CSW was contacted by [employer] with [facility]. It was reported that the patient was declined for placement due to no current appropriate beds. ? ?Danielle Prince, MSW, LCSW-A, LCAS-A ?Phone: [phone] ?Disposition/TOC ? ?

## 2022-02-28 NOTE — BH Assessment (Signed)
Clinician made contact with pt's nurse in an attempt to complete pt's [place] Assessment. However, pt's nurse, Lucina Mellow, stated pt is still sedated from the [place] given earlier. Pt's nurse requested TTS attempt assessment at a later time. ?

## 2022-02-28 NOTE — ED Notes (Signed)
Patient ambulatory to bed 52, sitter at bedside. Patient denies any needs or complaints at this time.  ?

## 2022-02-28 NOTE — Progress Notes (Signed)
Per Vivien Presto, patient meets criteria for inpatient treatment. There are no available or appropriate beds at [facility] today. CSW faxed referrals to the following facilities for review: ? ?[facility]  Pending - Request Sent N/A [address] [place] [address] [phone] --  ?[facility]  Pending - Request Sent N/A [address] [place] [address] [phone] --  ?[facility]  Pending - Request Sent N/A [address] [place] [address] [phone] --  ?[facility]  Pending - Request Sent N/A [address] [place] [address] [phone] --  ?[facility]  Pending - Request Sent N/A [address] [place] [address] [phone] --  ?[facility]  Pending - Request Sent N/A [address], [place] [address] [phone] --  ?[facility]  Pending - Request Sent N/A [address] [place], [place] [address] [phone] --  ?[facility]  Pending - Request Sent N/A [address] [place] [address] [phone] --  ?[facility]  Pending - Request Sent N/A [address] [place] [address] [phone] --  ?[facility]  Pending - Request Sent N/A [address] [place] [address] [phone] --  ?[facility]  Pending - Request Sent N/A [address] [place] [address] [phone] --  ?[facility]  Pending - Request Sent N/A [address] [place] [address] [phone] --  ?[facility]  Pending - Request Sent N/A [address] [place] [place] [address] [phone] --  ?[facility]  Pending - Request Sent N/A [address] [place] [address] [phone] --  ?[facility]  Pending - Request Sent N/A [address] [place] [address] [phone] --  ? ? ?TTS will continue to seek bed placement. ? ?Crissie Reese, MSW, LCSW-A, LCAS-A ?Phone: [phone] ?Disposition/TOC ? ?

## 2022-02-28 NOTE — ED Provider Notes (Signed)
?[facility] EMERGENCY DEPARTMENT ?Provider Note ? ? ?CSN: [id number] ?Arrival date & time: 02/27/22  2242 ? ?  ? ?History ? ?Chief Complaint  ?Patient presents with  ? Manic Behavior  ? ? ?Adrieana Laskin is a 26 y.o. female. ? ? ?Mental Health Problem ?Presenting symptoms: aggressive behavior, hallucinations and paranoid behavior   ?Patient accompanied by:  Conni Elliot enforcement ?Degree of incapacity (severity):  Severe ?Onset quality:  Gradual ?Timing:  Constant ?Context: drug abuse   ? ?  ? ?Home Medications ?Prior to Admission medications   ?Medication Sig Start Date End Date Taking? Authorizing Provider  ?buPROPion (WELLBUTRIN XL) 150 MG 24 hr tablet Take 150 mg by mouth daily. 01/26/22   [provider]  ?calcium carbonate (TUMS - DOSED IN MG ELEMENTAL CALCIUM) 500 MG chewable tablet Chew 1-2 tablets by mouth 2 (two) times daily as needed for indigestion or heartburn.    [provider]  ?escitalopram (LEXAPRO) 20 MG tablet Take 20 mg by mouth daily. 01/26/22   [provider]  ?gabapentin (NEURONTIN) 300 MG capsule Take 1 capsule (300 mg total) by mouth 3 (three) times daily. 11/22/19   Malvin Johns, MD  ?hydrOXYzine (VISTARIL) 50 MG capsule Take 100 mg by mouth at bedtime.    [provider]  ?mirtazapine (REMERON) 15 MG tablet Take 15 mg by mouth at bedtime. 01/26/22   [provider]  ?omeprazole (PRILOSEC) 20 MG capsule Take 1 capsule (20 mg total) by mouth 2 (two) times daily before a meal. 02/12/22 02/07/23  Champ Mungo, DO  ?sucralfate (CARAFATE) 1 GM/10ML suspension Take 10 mLs (1 g total) by mouth 4 (four) times daily for 14 days. 02/12/22 02/26/22  Champ Mungo, DO  ?   ? ?Allergies    ?Sodium ferric gluconate [ferrous gluconate]   ? ?Review of Systems   ?Review of Systems  ?Unable to perform ROS: Psychiatric disorder  ?Psychiatric/Behavioral:  Positive for hallucinations and paranoia.   ? ?Physical Exam ?Updated Vital Signs ?BP (!) 145/89   Pulse (!)  102   Temp 98.6 ?F (37 ?C) (Temporal)   Resp 15   LMP 02/05/2022   SpO2 96%  ?Physical Exam ?Vitals and nursing note reviewed.  ?HENT:  ?   Head: Normocephalic and atraumatic.  ?   Nose: Nose normal. No congestion or rhinorrhea.  ?   Mouth/Throat:  ?   Mouth: Mucous membranes are moist.  ?   Pharynx: Oropharynx is clear.  ?Eyes:  ?   Pupils: Pupils are equal, round, and reactive to light.  ?Cardiovascular:  ?   Rate and Rhythm: Tachycardia present.  ?Pulmonary:  ?   Effort: Pulmonary effort is normal.  ?Abdominal:  ?   General: Abdomen is flat.  ?Musculoskeletal:     ?   General: No swelling or tenderness. Normal range of motion.  ?Skin: ?   General: Skin is warm and dry.  ?Neurological:  ?   General: No focal deficit present.  ? ? ?ED Results / Procedures / Treatments   ?Labs ?(all labs ordered are listed, but only abnormal results are displayed) ?Labs Reviewed  ?CBC - Abnormal; Notable for the following components:  ?    Result Value  ? WBC 11.3 (*)   ? Hemoglobin 11.0 (*)   ? MCV 76.5 (*)   ? MCH 23.3 (*)   ? All other components within normal limits  ?COMPREHENSIVE METABOLIC PANEL - Abnormal; Notable for the following components:  ? Sodium 134 (*)   ?  Potassium 3.1 (*)   ? CO2 20 (*)   ? Glucose, Bld 108 (*)   ? Total Protein 6.4 (*)   ? All other components within normal limits  ?RESP PANEL BY RT-PCR (FLU A&B, COVID) ARPGX2  ?ETHANOL  ?RAPID URINE DRUG SCREEN, HOSP PERFORMED  ?I-STAT BETA HCG BLOOD, ED ([facility], [facility], [facility] ONLY)  ? ? ?EKG ?None ? ?Radiology ?No results found. ? ?Procedures ?Marland KitchenCritical Care ?Performed by: Marily Memos, MD ?Authorized by: Marily Memos, MD  ? ?Critical care provider statement:  ?  Critical care time (minutes):  30 ?  Critical care was necessary to treat or prevent imminent or life-threatening deterioration of the following conditions: psychiatric breakdown. ?  Critical care was time spent personally by me on the following activities:  Development of treatment plan with patient or  surrogate, discussions with consultants, evaluation of patient's response to treatment, examination of patient, ordering and review of laboratory studies, ordering and review of radiographic studies, ordering and performing treatments and interventions, pulse oximetry, re-evaluation of patient's condition and review of old charts  ? ? ?Medications Ordered in ED ?Medications  ?ziprasidone (GEODON) injection 20 mg ( Intramuscular Not Given 02/27/22 2345)  ?LORazepam (ATIVAN) 2 MG/ML injection (  Given 02/27/22 2304)  ?sterile water (preservative free) injection (1 mL  Given 02/27/22 2245)  ? ? ?ED Course/ Medical Decision Making/ A&P ?  ?                        ?Medical Decision Making ? ?Not able to fully assess patient secondary to acute psychiatric crisis. Sedated. Will reeval.  ? ?Patient taken out of restraints, still not able to be assessed.  ? ?Patient will need TTS assessment when she wakes up.  ? ?Final Clinical Impression(s) / ED Diagnoses ?Final diagnoses:  ?None  ? ? ?Rx / DC Orders ?ED Discharge Orders   ? ? None  ? ?  ? ? ?  ?Marily Memos, MD ?02/28/22 7209 ? ?

## 2022-03-01 ENCOUNTER — Encounter (HOSPITAL_COMMUNITY): Payer: Self-pay | Admitting: Registered Nurse

## 2022-03-01 ENCOUNTER — Emergency Department (HOSPITAL_COMMUNITY): Payer: BLUE CROSS/BLUE SHIELD

## 2022-03-01 DIAGNOSIS — F312 Bipolar disorder, current episode manic severe with psychotic features: Secondary | ICD-10-CM

## 2022-03-01 MED ORDER — ESCITALOPRAM OXALATE 10 MG PO TABS
20.0000 mg | ORAL_TABLET | Freq: Every day | ORAL | Status: DC
  Administered 2022-03-01 – 2022-03-02 (×2): 20 mg via ORAL
  Filled 2022-03-01 (×2): qty 2

## 2022-03-01 MED ORDER — ARIPIPRAZOLE 5 MG PO TABS
15.0000 mg | ORAL_TABLET | Freq: Every day | ORAL | Status: DC
  Administered 2022-03-01: 15 mg via ORAL
  Filled 2022-03-01: qty 1

## 2022-03-01 MED ORDER — ZIPRASIDONE MESYLATE 20 MG IM SOLR: 20.0000 mg | INTRAMUSCULAR | Status: DC | PRN

## 2022-03-01 MED ORDER — LORAZEPAM 1 MG PO TABS
1.0000 mg | ORAL_TABLET | Freq: Three times a day (TID) | ORAL | Status: AC
  Administered 2022-03-01 (×3): 1 mg via ORAL
  Filled 2022-03-01 (×3): qty 1

## 2022-03-01 MED ORDER — HYDROXYZINE PAMOATE 50 MG PO CAPS
100.0000 mg | ORAL_CAPSULE | Freq: Every evening | ORAL | Status: DC | PRN
  Filled 2022-03-01: qty 2

## 2022-03-01 MED ORDER — OLANZAPINE 5 MG PO TBDP
5.0000 mg | ORAL_TABLET | Freq: Every day | ORAL | Status: DC
  Administered 2022-03-01 – 2022-03-02 (×2): 5 mg via ORAL
  Filled 2022-03-01 (×2): qty 1

## 2022-03-01 MED ORDER — GABAPENTIN 300 MG PO CAPS
300.0000 mg | ORAL_CAPSULE | Freq: Three times a day (TID) | ORAL | Status: DC
  Administered 2022-03-01 – 2022-03-02 (×4): 300 mg via ORAL
  Filled 2022-03-01 (×4): qty 1

## 2022-03-01 MED ORDER — BUPROPION HCL ER (XL) 150 MG PO TB24
150.0000 mg | ORAL_TABLET | Freq: Every day | ORAL | Status: DC
  Administered 2022-03-01 – 2022-03-02 (×2): 150 mg via ORAL
  Filled 2022-03-01 (×2): qty 1

## 2022-03-01 MED ORDER — LORAZEPAM 2 MG/ML IJ SOLN: 1.0000 mg | Freq: Three times a day (TID) | INTRAMUSCULAR | Status: AC

## 2022-03-01 MED ORDER — LAMOTRIGINE 100 MG PO TABS: 200.0000 mg | ORAL_TABLET | Freq: Every day | ORAL | Status: DC

## 2022-03-01 MED ORDER — HYDROXYZINE HCL 50 MG PO TABS: 100.0000 mg | ORAL_TABLET | Freq: Every evening | ORAL | Status: DC | PRN

## 2022-03-01 NOTE — ED Notes (Signed)
Mother came by for visit. Spoke with mother at length about general plan, process, timeframe, possibilities, and visiting hours. Offered visit. Pt sleeping. Mother said she would return in the morning. Mother agreeable.   ?

## 2022-03-01 NOTE — ED Notes (Signed)
Up to b/r, steady gait, sitter present, meds given, asking about time frame for leaving, "explained plan and timeframe developing, will keep updated".  ?

## 2022-03-01 NOTE — ED Notes (Signed)
Alert, NAD, calm, frequent persistent inappropriate laughter ?

## 2022-03-01 NOTE — ED Notes (Signed)
Meds given. TTS initiated/ in progress.  ?

## 2022-03-01 NOTE — ED Provider Notes (Signed)
Emergency Medicine Observation Re-evaluation Note ? ?Danielle Prince is a 26 y.o. female, seen on rounds today.  Pt initially presented to the ED for complaints of Manic Behavior ?Currently, the patient is stable. ? ?Physical Exam  ?BP 128/64 (BP Location: Right Arm)   Pulse 77   Temp 98.8 ?F (37.1 ?C) (Oral)   Resp 16   LMP 02/05/2022   SpO2 98%  ?Physical Exam ?General: NAD ?Cardiac: well perfused ?Lungs: even and unlabored ?Psych: no agitation ? ?ED Course / MDM  ?EKG:EKG Interpretation ? ?Date/Time:  Saturday February 27 2022 23:12:31 EDT ?Ventricular Rate:  99 ?PR Interval:  133 ?QRS Duration: 87 ?QT Interval:  350 ?QTC Calculation: 450 ?R Axis:   87 ?Text Interpretation: Sinus rhythm LVH by voltage Borderline ST elevation, lateral leads Confirmed by Danielle Prince [phone]) on 02/28/2022 11:58:38 AM ? ?I have reviewed the labs performed to date as well as medications administered while in observation.  Recent changes in the last 24 hours include agitated yesterday requiring Geodon and restraints. Home meds are ordered. Psychiatry recommended inpatient treatment ? ?Plan  ?Current plan is for Inpatient psychiatric admission. ?Danielle Prince is under involuntary commitment. ?  ? ?  ?Danielle Avena, MD ?03/01/22 [phone] ? ?

## 2022-03-01 NOTE — Consult Note (Addendum)
Telepsych Consultation   Reason for Consult:  Bizarre behavior, IVC Referring Physician:  Marily Memos, MD Location of Patient: [facility] ED Location of Provider: Other: [facility]  Patient Identification: [place]le Prince MRN:  [id number] Principal Diagnosis: Bipolar depression manic phase (HCC) Diagnosis:  Principal Problem:   Bipolar depression manic phase (HCC) Active Problems:   Severe episode of recurrent major depressive disorder, without psychotic features (HCC)   Generalized anxiety disorder   MDD (major depressive disorder), recurrent episode (HCC)   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HCC)   Total Time spent with patient: 30 minutes  Subjective:  [place]le Prince is a 26 year old female admitted to [facility] ED after presenting via [facility] after she was found naked and acting erratically. Patient spitting, aggressive behavior, and laughing at inappropriate times.  IVC petition by EDP 25 yr. female history of depression presents via [employer] today with report that she was running around naked and spitting. Patient is screaming, yelling inappropriate words, attempting to hit and bite and police officers. She does not orient to name or other instructions. Patient appears agitated and manic.   HPI:  [place]le Prince, 26 y.o., female patient seen via tele health by this provider, consulted with Dr. Earlene Plater; and chart reviewed on 03/01/22.  On evaluation [place]le Prince reports she was brought to the emergency room because she was having too much pain.  Patient denies suicidal/self-harm/homicidal ideations, psychosis, and paranoia.  Patient reports she lives with her father and is currently unemployed.  When asked who her outpatient psychiatric provider was patient responded "[facility]."  Patient does state that she is scheduled for therapy session today.  Patient was unable to give information on current medications but states she took her medications as ordered.  Patient was  able to give the correct information as to her current place, city, state, date of birth, age, and current president.  Patient also asked who is her primary support her and she stated it was her father when asked where her father was at this time patient stated "[facility]" During evaluation Danielle Prince is sitting on side of bed in no acute distress.  She is alert, oriented x , calm and cooperative throughout assessment.  Her mood is anxious.  Patient is laughing throughout assessment and inappropriately.  She is unable to answer most questions when related to her psychiatric treatment or reason that she was brought into the hospital.  She is also making could not noises like a yelp or bark.  She states that did not noises started on Thursday of last week.  When asked about the last thing patient only responded that she was having a good time.  Patient is not a good historian but collateral information was obtained from patient's mother by TTS on initial assessment.  Patient's home medications were restarted.  Also ordered Ativan 1 mg p.o. or IM to address akathisia, Tourette's, EPS.  Will continue to recommend inpatient psychiatric treatment   Past Psychiatric History: See above  Risk to Self:  Denies Risk to Others:  Denies Prior Inpatient Therapy:  Yes Prior Outpatient Therapy:  Yes  Past Medical History:  Past Medical History:  Diagnosis Date   ADHD (attention deficit hyperactivity disorder)    Anorexia nervosa with bulimia    Anxiety    Aspirin overdose 02/18/2012   Depression    Dysrhythmia    Electrolyte disturbance 2010, 2012   hypokalemia, hypochloremia.   Urinary tract infection    Varicella     Past Surgical History:  Procedure  Laterality Date   BIOPSY  02/12/2022   Procedure: BIOPSY;  Surgeon: Lynann BolognaGupta, Rajesh, MD;  [facility];  Service: Gastroenterology;;   COLONOSCOPY WITH ESOPHAGOGASTRODUODENOSCOPY (EGD) N/A 10/28/2013   Procedure: COLONOSCOPY WITH  ESOPHAGOGASTRODUODENOSCOPY (EGD);  Surgeon: Beverley FiedlerJay M Pyrtle, MD;  [facility];  Service: Endoscopy;  Laterality: N/A;   ESOPHAGOGASTRODUODENOSCOPY (EGD) WITH PROPOFOL N/A 02/12/2022   Procedure: ESOPHAGOGASTRODUODENOSCOPY (EGD) WITH PROPOFOL;  Surgeon: Lynann BolognaGupta, Rajesh, MD;  [facility];  Service: Gastroenterology;  Laterality: N/A;   GUM SURGERY     INDUCED ABORTION     Family History:  Family History  Problem Relation Age of Onset   Cancer Father    Depression Sister    Depression Paternal Aunt    Drug abuse Maternal Uncle        deceased   Drug abuse Paternal Aunt    Colon cancer Maternal Grandmother    Family Psychiatric  History: See above Social History:  Social History   Substance and Sexual Activity  Alcohol Use Not Currently   Alcohol/week: 0.0 standard drinks     Social History   Substance and Sexual Activity  Drug Use Not Currently   Types: Methamphetamines, IV   Comment: use $40 twice weekly    Social History   Socioeconomic History   Marital status: Single    Spouse name: Not on file   Number of children: Not on file   Years of education: Not on file   Highest education level: Not on file  Occupational History   Occupation: Pharmacist, [employer]    Employer: [employer]  Tobacco Use   Smoking status: Some Days    Packs/day: 0.50    Types: Cigarettes   Smokeless tobacco: Never  Vaping Use   Vaping Use: Every day   Substances: Nicotine  Substance and Sexual Activity   Alcohol use: Not Currently    Alcohol/week: 0.0 standard drinks   Drug use: Not Currently    Types: Methamphetamines, IV    Comment: use $40 twice weekly   Sexual activity: Yes    Partners: Male    Birth control/protection: None  Other Topics Concern   Not on file  Social History Narrative   Not on file   Social Determinants of Health   Financial Resource Strain: Not on file  Food Insecurity: Not on file  Transportation Needs: Not on file  Physical Activity: Not on file   Stress: Not on file  Social Connections: Not on file   Additional Social History:    Allergies:   Allergies  Allergen Reactions   Sodium Ferric Gluconate [Ferrous Gluconate] Shortness Of Breath, Anxiety, Rash and Cough    Reaction occurred in 2014 during Furlecit infusion. Patient received 125 mg Solu-medrol and symptoms resolved.     Labs:  Results for orders placed or performed during the hospital encounter of 02/27/22 (from the past 48 hour(s))  I-Stat Beta hCG blood, ED ([facility], [facility], [facility] only)     Status: None   Collection Time: 02/27/22 11:33 PM  Result Value Ref Range   I-stat hCG, quantitative <5.0 <5 mIU/mL   Comment 3            Comment:   GEST. AGE      CONC.  (mIU/mL)   <=1 WEEK        5 - 50     2 WEEKS       50 - 500     3 WEEKS       100 -  10,000     4 WEEKS     1,000 - 30,000        FEMALE AND NON-PREGNANT FEMALE:     LESS THAN 5 mIU/mL   CBC     Status: Abnormal   Collection Time: 02/27/22 11:36 PM  Result Value Ref Range   WBC 11.3 (H) 4.0 - 10.5 K/uL   RBC 4.72 3.87 - 5.11 MIL/uL   Hemoglobin 11.0 (L) 12.0 - 15.0 g/dL   HCT 09.8 11.9 - 14.7 %   MCV 76.5 (L) 80.0 - 100.0 fL   MCH 23.3 (L) 26.0 - 34.0 pg   MCHC 30.5 30.0 - 36.0 g/dL   RDW Not Measured 82.9 - 15.5 %   Platelets 399 150 - 400 K/uL   nRBC 0.0 0.0 - 0.2 %    Comment: Performed at [facility], [address] [place], [place] [address]  Comprehensive metabolic panel     Status: Abnormal   Collection Time: 02/27/22 11:36 PM  Result Value Ref Range   Sodium 134 (L) 135 - 145 mmol/L   Potassium 3.1 (L) 3.5 - 5.1 mmol/L   Chloride 102 98 - 111 mmol/L   CO2 20 (L) 22 - 32 mmol/L   Glucose, Bld 108 (H) 70 - 99 mg/dL    Comment: Glucose reference range applies only to samples taken after fasting for at least 8 hours.   BUN 16 6 - 20 mg/dL   Creatinine, Ser 0.86 0.44 - 1.00 mg/dL   Calcium 9.1 8.9 - 57.8 mg/dL   Total Protein 6.4 (L) 6.5 - 8.1 g/dL   Albumin 3.6 3.5 - 5.0 g/dL   AST 27 15  - 41 U/L   ALT 19 0 - 44 U/L   Alkaline Phosphatase 50 38 - 126 U/L   Total Bilirubin 0.3 0.3 - 1.2 mg/dL   GFR, Estimated >46 >96 mL/min    Comment: (NOTE) Calculated using the CKD-EPI Creatinine Equation (2021)    Anion gap 12 5 - 15    Comment: Performed at [facility], [address], [place], [place] [address]  Ethanol     Status: None   Collection Time: 02/27/22 11:36 PM  Result Value Ref Range   Alcohol, Ethyl (B) <10 <10 mg/dL    Comment: (NOTE) Lowest detectable limit for serum alcohol is 10 mg/dL.  For medical purposes only. Performed at [facility], [address] [place], [place] [address]   Resp Panel by RT-PCR (Flu A&B, Covid) Nasopharyngeal Swab     Status: None   Collection Time: 02/28/22  9:13 AM   Specimen: Nasopharyngeal Swab; Nasopharyngeal(NP) swabs in vial transport medium  Result Value Ref Range   SARS Coronavirus 2 by RT PCR NEGATIVE NEGATIVE    Comment: (NOTE) SARS-CoV-2 target nucleic acids are NOT DETECTED.  The SARS-CoV-2 RNA is generally detectable in upper respiratory specimens during the acute phase of infection. The lowest concentration of SARS-CoV-2 viral copies this assay can detect is 138 copies/mL. A negative result does not preclude SARS-Cov-2 infection and should not be used as the sole basis for treatment or other patient management decisions. A negative result may occur with  improper specimen collection/handling, submission of specimen other than nasopharyngeal swab, presence of viral mutation(s) within the areas targeted by this assay, and inadequate number of viral copies(<138 copies/mL). A negative result must be combined with clinical observations, patient history, and epidemiological information. The expected result is Negative.  Fact Sheet  for Patients:  BloggerCourse.com  Fact Sheet for Healthcare Providers:  SeriousBroker.it  This test is no t yet approved  or cleared by the [place] FDA and  has been authorized for detection and/or diagnosis of SARS-CoV-2 by FDA under an Emergency Use Authorization (EUA). This EUA will remain  in effect (meaning this test can be used) for the duration of the COVID-19 declaration under Section 564(b)(1) of the Act, 21 U.S.C.section 360bbb-3(b)(1), unless the authorization is terminated  or revoked sooner.       Influenza A by PCR NEGATIVE NEGATIVE   Influenza B by PCR NEGATIVE NEGATIVE    Comment: (NOTE) The Xpert Xpress SARS-CoV-2/FLU/RSV plus assay is intended as an aid in the diagnosis of influenza from Nasopharyngeal swab specimens and should not be used as a sole basis for treatment. Nasal washings and aspirates are unacceptable for Xpert Xpress SARS-CoV-2/FLU/RSV testing.  Fact Sheet for Patients: BloggerCourse.com  Fact Sheet for Healthcare Providers: SeriousBroker.it  This test is not yet approved or cleared by the [place] FDA and has been authorized for detection and/or diagnosis of SARS-CoV-2 by FDA under an Emergency Use Authorization (EUA). This EUA will remain in effect (meaning this test can be used) for the duration of the COVID-19 declaration under Section 564(b)(1) of the Act, 21 U.S.C. section 360bbb-3(b)(1), unless the authorization is terminated or revoked.  Performed at [facility], [address], [place], [place] [address]   Rapid urine drug screen (hospital performed)     Status: Abnormal   Collection Time: 02/28/22  9:18 AM  Result Value Ref Range   Opiates NONE DETECTED NONE DETECTED   Cocaine NONE DETECTED NONE DETECTED   Benzodiazepines POSITIVE (A) NONE DETECTED   Amphetamines NONE DETECTED NONE DETECTED   Tetrahydrocannabinol NONE DETECTED NONE DETECTED   Barbiturates NONE DETECTED NONE DETECTED    Comment: (NOTE) DRUG SCREEN FOR MEDICAL PURPOSES ONLY.  IF CONFIRMATION IS NEEDED FOR ANY  PURPOSE, NOTIFY LAB WITHIN 5 DAYS.  LOWEST DETECTABLE LIMITS FOR URINE DRUG SCREEN Drug Class                     Cutoff (ng/mL) Amphetamine and metabolites    1000 Barbiturate and metabolites    200 Benzodiazepine                 200 Tricyclics and metabolites     300 Opiates and metabolites        300 Cocaine and metabolites        300 THC                            50 Performed at [facility], [address] [place], [place] [address]     Medications:  Current Facility-Administered Medications  Medication Dose Route Frequency Provider Last Rate Last Admin   buPROPion (WELLBUTRIN XL) 24 hr tablet 150 mg  150 mg Oral Daily Ayshia Gramlich B, NP   150 mg at 03/01/22 1004   escitalopram (LEXAPRO) tablet 20 mg  20 mg Oral Daily Izear Pine B, NP   20 mg at 03/01/22 1004   gabapentin (NEURONTIN) capsule 300 mg  300 mg Oral TID Alonah Lineback B, NP   300 mg at 03/01/22 1004   hydrOXYzine (ATARAX) tablet 100 mg  100 mg Oral QHS PRN Romello Hoehn B, NP       LORazepam (ATIVAN) tablet 1 mg  1 mg Oral  TID Tatsuya Okray B, NP       Or   LORazepam (ATIVAN) injection 1 mg  1 mg Intramuscular TID Sundeep Destin B, NP       OLANZapine zydis (ZYPREXA) disintegrating tablet 5 mg  5 mg Oral Daily Camri Molloy B, NP       ziprasidone (GEODON) injection 20 mg  20 mg Intramuscular PRN Ernie Avena, MD       Current Outpatient Medications  Medication Sig Dispense Refill   amoxicillin (AMOXIL) 500 MG capsule Take 500 mg by mouth daily as needed (dental procedure).     buPROPion (WELLBUTRIN XL) 150 MG 24 hr tablet Take 150 mg by mouth daily.     escitalopram (LEXAPRO) 20 MG tablet Take 20 mg by mouth daily.     gabapentin (NEURONTIN) 300 MG capsule Take 1 capsule (300 mg total) by mouth 3 (three) times daily. 90 capsule 2   hydrOXYzine (VISTARIL) 50 MG capsule Take 100 mg by mouth at bedtime as needed for anxiety.     mirtazapine (REMERON) 15 MG tablet Take 15 mg by mouth at bedtime  as needed (sleep).     omeprazole (PRILOSEC) 20 MG capsule Take 1 capsule (20 mg total) by mouth 2 (two) times daily before a meal. 60 capsule 11   sucralfate (CARAFATE) 1 GM/10ML suspension Take 10 mLs (1 g total) by mouth 4 (four) times daily for 14 days. 560 mL 0    Musculoskeletal: Strength & Muscle Tone: within normal limits Gait & Station: normal Patient leans: N/A          Psychiatric Specialty Exam:  Presentation  General Appearance: Appropriate for Environment  Eye Contact:Fleeting  Speech:Clear and Coherent; Pressured  Speech Volume:Normal  Handedness:Right   Mood and Affect  Mood:Anxious; Labile  Affect:Labile; Inappropriate   Thought Process  Thought Processes:Coherent; Disorganized; Irrevelant  Descriptions of Associations:Loose  Orientation:Full (Time, Place and Person)  Thought Content:Scattered  History of Schizophrenia/Schizoaffective disorder:No  Duration of Psychotic Symptoms:No data recorded Hallucinations:Hallucinations: None  Ideas of Reference:None  Suicidal Thoughts:Suicidal Thoughts: No  Homicidal Thoughts:Homicidal Thoughts: No   Sensorium  Memory:Recent Poor; Remote Poor; Immediate Poor  Judgment:Impaired  Insight:Lacking   Executive Functions  Concentration:Poor  Attention Span:Poor  Recall:Poor  Fund of Knowledge:Poor  Language:Fair   Psychomotor Activity  Psychomotor Activity:Psychomotor Activity: Psychomotor Retardation; Restlessness; Extrapyramidal Side Effects (EPS) Extrapyramidal Side Effects (EPS): Akathisia; Tardive Dyskinesia; Other (comment) (tourette's) AIMS Completed?: No   Assets  Assets:Desire for Improvement; Social Support; Housing   Sleep  Sleep:Sleep: Poor   Physical Exam: Physical Exam Vitals and nursing note reviewed. Exam conducted with a chaperone present.  Constitutional:      General: She is not in acute distress.    Appearance: Normal appearance. She is not  ill-appearing.  Cardiovascular:     Rate and Rhythm: Normal rate.  Pulmonary:     Effort: Pulmonary effort is normal.  Neurological:     Mental Status: She is alert and oriented to person, place, and time.  Psychiatric:        Attention and Perception: She is inattentive.        Mood and Affect: Mood is anxious. Affect is labile.        Speech: Speech is delayed and tangential.        Behavior: Behavior is cooperative.        Thought Content: Thought content is not delusional. Thought content does not include suicidal ideation.        Judgment:  Judgment is impulsive.   Review of Systems  Unable to perform ROS: Acuity of condition (Unable to complete)  Psychiatric/Behavioral:  Negative for suicidal ideas. The patient is nervous/anxious and has insomnia.   Blood pressure 128/64, pulse 77, temperature 98.8 F (37.1 C), temperature source Oral, resp. rate 16, last menstrual period 02/05/2022, SpO2 98 %. There is no height or weight on file to calculate BMI.  Treatment Plan Summary: Daily contact with patient to assess and evaluate symptoms and progress in treatment, Medication management, and Plan Psychiatric hospitalization  Medication management: Discontinued Abilify.  Lamictal held unable to determine last dose and chart doesn't indicate patient taking during last admission 02/22/22.  buPROPion  150 mg Oral Daily   escitalopram  20 mg Oral Daily   gabapentin  300 mg Oral TID   LORazepam  1 mg Oral TID   Or   LORazepam  1 mg Intramuscular TID   OLANZapine zydis  5 mg Oral Daily     Disposition: Recommend psychiatric Inpatient admission when medically cleared.  This service was provided via telemedicine using a 2-way, interactive audio and video technology.  Names of all persons participating in this telemedicine service and their role in this encounter. Name: Assunta Found Role: NP  Name: [place]le Prince Role: Patient  Name:  Role:   Name:  Role:     Secure message sent to  patients nurse Ella Bodo, RN and social work informing:  Psychiatric consult completed and patient continues to be recommended for inpatient psychiatric treatment.  Patient recommended for thought disorder bed; (may be appropriated on mood d/o or 400 hall.)  If no appropriate beds at [facility] patient will need to be faxed out.  Please inform MD only default listed.    Keyshun Elpers, NP 03/01/2022 12:27 PM

## 2022-03-01 NOTE — Progress Notes (Signed)
Pt is under review at [facility] and CSW sent nursing and provider notes as request via fax [phone]. CSW called and information has not been received at but CSW received a successful fax confirmation.  ? ? ? ?Maryjean Ka, MSW, LCSWA ?03/01/2022 9:11 PM ? ? ?

## 2022-03-01 NOTE — ED Notes (Signed)
No change after PO meds. Remains manic, agitated, persistent repetitive continual vocalizations increasing, appears to be escalating.  ?

## 2022-03-01 NOTE — Progress Notes (Signed)
Patient has been denied by [facility] and has been faxed out. Patient meets [facility] inpatient criteria per Ophelia Shoulder, NP. Patient has been faxed out to the following facilities:  ? ?[facility]  [address] [place] [address] [phone]  ?[facility]  [address] [place] [address] [phone]  ?[facility] [address] [place] [address] [phone]  ?[facility]  [address], [place] [address] [phone]  ?[facility]  [address] [place] [address] [phone]  ?[facility]  [address] [place], [place] [address] [phone]  ?[facility]  [address] [place], [place] [address] [phone]  ?[facility]  [address] [place] [address] [phone]  ?[facility]  [address], [place] [address] [phone]  ?[facility]  [address] [place] [address] [phone]  ?[facility]  [address], [place] [address] [phone]  ?[facility]  [address] [place] [address] [phone]  ?[facility]  [address] [place] [address] [phone]  ?[facility]  [address], [place] [address] [phone]  ?[facility]  [address] [place], [place] [address] [phone]  ? ?Damita Dunnings, MSW, LCSW-A  ?11:48 AM 03/01/2022   ?

## 2022-03-01 NOTE — ED Notes (Signed)
Pt believes she is fine and can go home. States she was just having fun. Plan for admission explained with rationale. Pt disagrees, but is agreeable. Dinner eaten. Lights turned down per request. Informed that mother came by during visiting hours, but she was asleep, and mothers intent to return in the morning. Sitter present.  ?

## 2022-03-01 NOTE — ED Notes (Signed)
Breakfast order placed ?

## 2022-03-01 NOTE — Progress Notes (Signed)
Pt was accepted to [facility] 03/02/22 after 9:00am; Bed Assignment [facility]. ? ?Pt meets inpatient criteria per Assunta Found, NP ? ?Attending Physician will be Dr. Estill Cotta ? ?Report can be called to: [phone] or [phone] ? ?Pt can arrive after 9:00am ? ?Care Team Notified: Adonis Brook, RN. ? ?Kelton Pillar, LCSWA ?03/01/2022 @ 10:14 PM ? ?

## 2022-03-01 NOTE — ED Notes (Signed)
Pt sleeping, CT here for pt, pt to CT by w/c.  ?

## 2022-03-02 NOTE — ED Notes (Signed)
Breakfast order placed ?

## 2022-03-16 ENCOUNTER — Inpatient Hospital Stay: Payer: BLUE CROSS/BLUE SHIELD | Admitting: Family Medicine

## 2022-08-20 ENCOUNTER — Encounter: Payer: BLUE CROSS/BLUE SHIELD | Admitting: Student

## 2022-09-16 ENCOUNTER — Encounter: Payer: Self-pay | Admitting: Student

## 2022-09-16 ENCOUNTER — Encounter: Payer: BLUE CROSS/BLUE SHIELD | Admitting: Internal Medicine

## 2022-09-16 NOTE — Progress Notes (Deleted)
   CC: ***  HPI:Ms.Evora Lazenby is a 26 y.o. female who presents for evaluation of ***. Please see individual problem based A/P for details.  Mental health Hx eating disorder On buproprion  Escitalopram gabapentin Hydroxyzine Mirtazapinr No psych referral was placed - stopped buproprion -psych referral  Hypokalemia - recheck K?  Flu shot   Depression, PHQ-9: Based on the patients  [place] Office Visit from 02/22/2022 in [place]  PHQ-9 Total Score 0      score we have ***.  Past Medical History:  Diagnosis Date   ADHD (attention deficit hyperactivity disorder)    Anorexia nervosa with bulimia    Anxiety    Aspirin overdose 02/18/2012   Depression    Dysrhythmia    Electrolyte disturbance 2010, 2012   hypokalemia, hypochloremia.   Urinary tract infection    Varicella    Review of Systems:   ROS   Physical Exam: There were no vitals filed for this visit.   General: *** HEENT: Conjunctiva nl , antiicteric sclerae, moist mucous membranes, no exudate or erythema Cardiovascular: Normal rate, regular rhythm.  No murmurs, rubs, or gallops Pulmonary : Equal breath sounds, No wheezes, rales, or rhonchi Abdominal: soft, nontender,  bowel sounds present Ext: No edema in lower extremities, no tenderness to palpation of lower extremities.   Assessment & Plan:   See Encounters Tab for problem based charting.  Patient {GC/GE:[id number]::"discussed with","seen with"} Dr. {[id number]::"L. Hoffman","Guilloud","Mullen","Narendra","Raines","Vincent","Williams"}

## 2022-09-21 ENCOUNTER — Other Ambulatory Visit: Payer: Self-pay

## 2022-09-21 ENCOUNTER — Emergency Department (HOSPITAL_BASED_OUTPATIENT_CLINIC_OR_DEPARTMENT_OTHER): Payer: BLUE CROSS/BLUE SHIELD | Admitting: Radiology

## 2022-09-21 ENCOUNTER — Emergency Department (HOSPITAL_BASED_OUTPATIENT_CLINIC_OR_DEPARTMENT_OTHER)
Admission: EM | Admit: 2022-09-21 | Discharge: 2022-09-21 | Disposition: A | Discharge status: Home / Self Care | Payer: BLUE CROSS/BLUE SHIELD | Attending: Emergency Medicine | Admitting: Emergency Medicine

## 2022-09-21 ENCOUNTER — Encounter (HOSPITAL_BASED_OUTPATIENT_CLINIC_OR_DEPARTMENT_OTHER): Payer: Self-pay

## 2022-09-21 DIAGNOSIS — R042 Hemoptysis: Secondary | ICD-10-CM | POA: Diagnosis present

## 2022-09-21 DIAGNOSIS — D649 Anemia, unspecified: Secondary | ICD-10-CM | POA: Insufficient documentation

## 2022-09-21 LAB — CBC WITH DIFFERENTIAL/PLATELET
Abs Immature Granulocytes: 0.03 10*3/uL (ref 0.00–0.07)
Basophils Absolute: 0 10*3/uL (ref 0.0–0.1)
Basophils Relative: 1 %
Eosinophils Absolute: 0.2 10*3/uL (ref 0.0–0.5)
Eosinophils Relative: 3 %
HCT: 29 % — ABNORMAL LOW (ref 36.0–46.0)
Hemoglobin: 9 g/dL — ABNORMAL LOW (ref 12.0–15.0)
Immature Granulocytes: 0 %
Lymphocytes Relative: 19 %
Lymphs Abs: 1.4 10*3/uL (ref 0.7–4.0)
MCH: 22.2 pg — ABNORMAL LOW (ref 26.0–34.0)
MCHC: 31 g/dL (ref 30.0–36.0)
MCV: 71.4 fL — ABNORMAL LOW (ref 80.0–100.0)
Monocytes Absolute: 0.4 10*3/uL (ref 0.1–1.0)
Monocytes Relative: 5 %
Neutro Abs: 5.6 10*3/uL (ref 1.7–7.7)
Neutrophils Relative %: 72 %
Platelets: 405 10*3/uL — ABNORMAL HIGH (ref 150–400)
RBC: 4.06 MIL/uL (ref 3.87–5.11)
RDW: 17 % — ABNORMAL HIGH (ref 11.5–15.5)
WBC: 7.7 10*3/uL (ref 4.0–10.5)
nRBC: 0 % (ref 0.0–0.2)

## 2022-09-21 LAB — COMPREHENSIVE METABOLIC PANEL
ALT: 11 U/L (ref 0–44)
AST: 13 U/L — ABNORMAL LOW (ref 15–41)
Albumin: 4 g/dL (ref 3.5–5.0)
Alkaline Phosphatase: 56 U/L (ref 38–126)
Anion gap: 10 (ref 5–15)
BUN: 18 mg/dL (ref 6–20)
CO2: 27 mmol/L (ref 22–32)
Calcium: 9.3 mg/dL (ref 8.9–10.3)
Chloride: 102 mmol/L (ref 98–111)
Creatinine, Ser: 0.79 mg/dL (ref 0.44–1.00)
GFR, Estimated: >60 mL/min (ref >60)
Glucose, Bld: 108 mg/dL — ABNORMAL HIGH (ref 70–99)
Potassium: 3.8 mmol/L (ref 3.5–5.1)
Sodium: 139 mmol/L (ref 135–145)
Total Bilirubin: 0.2 mg/dL — ABNORMAL LOW (ref 0.3–1.2)
Total Protein: 6.9 g/dL (ref 6.5–8.1)

## 2022-09-21 NOTE — ED Notes (Signed)
Attempted straight stick, unsuccessful.  Pt states they usually need to do USIV on her.

## 2022-09-21 NOTE — ED Triage Notes (Signed)
Pt states that she has hx of esophagitis. Pt states that she has been coughing up blood x 5 days. Pt states that she is concerned about her blood counts.

## 2022-09-21 NOTE — ED Notes (Signed)
Pt d/c home per MD order. Discharge summary reviewed, pt verbalizes understanding. Ambulatory off unit. No s/s of acute distress noted at discharge.  °

## 2022-09-21 NOTE — ED Provider Notes (Signed)
[facility] EMERGENCY DEPT Provider Note   CSN: [id number] Arrival date & time: 09/21/22  1034     History  Chief Complaint  Patient presents with   Hemoptysis    Danielle Prince is a 26 y.o. female.  Patient reports that she had an episode of coughing and saw some blood.  Patient reports she has a history of esophagitis secondary to bulimia.  Patient is concerned that she has become anemic.  Patient reports coughing was second to vomiting .she has not had any fever she has not had any chills she does not feel like she has any type of respiratory infection.  Patient denies any diarrhea she denies any abdominal pain.  Patient is in counseling for her bulimia.  Patient is followed by GI.  Patient has required blood transfusions in the past secondary to anemia  The history is provided by the patient. No language interpreter was used.       Home Medications Prior to Admission medications   Medication Sig Start Date End Date Taking? Authorizing Provider  amoxicillin (AMOXIL) 500 MG capsule Take 500 mg by mouth daily as needed (dental procedure). 02/25/22   [provider]  ARIPiprazole (ABILIFY) 10 MG tablet Take 10 mg by mouth daily. 09/02/22   [provider]  buPROPion (WELLBUTRIN XL) 150 MG 24 hr tablet Take 150 mg by mouth daily. 01/26/22   [provider]  buPROPion (WELLBUTRIN XL) 300 MG 24 hr tablet Take 300 mg by mouth every morning. 09/02/22   [provider]  Dexmethylphenidate HCl 40 MG CP24 Take 1 capsule by mouth every morning. 07/26/22   [provider]  escitalopram (LEXAPRO) 20 MG tablet Take 20 mg by mouth daily. 01/26/22   [provider]  gabapentin (NEURONTIN) 300 MG capsule Take 1 capsule (300 mg total) by mouth 3 (three) times daily. 11/22/19   Malvin Johns, MD  hydrOXYzine (VISTARIL) 100 MG capsule Take 100 mg by mouth at bedtime as needed for anxiety.    [provider]  mirtazapine (REMERON) 15  MG tablet Take 15 mg by mouth at bedtime as needed (sleep). 01/26/22   [provider]  OLANZapine (ZYPREXA) 5 MG tablet Take 5 mg by mouth at bedtime. 09/02/22   [provider]  omeprazole (PRILOSEC) 20 MG capsule Take 1 capsule (20 mg total) by mouth 2 (two) times daily before a meal. 02/12/22 02/07/23  Champ Mungo, DO  sucralfate (CARAFATE) 1 GM/10ML suspension Take 10 mLs (1 g total) by mouth 4 (four) times daily for 14 days. 02/12/22 02/28/22  Champ Mungo, DO      Allergies    Sodium ferric gluconate [ferrous gluconate]    Review of Systems   Review of Systems  All other systems reviewed and are negative.   Physical Exam Updated Vital Signs BP 113/77   Pulse 94   Temp 98.3 F (36.8 C) (Oral)   Resp 16   Ht 5\' 10"  (1.778 m)   Wt 79.4 kg   LMP 09/06/2022   SpO2 98%   BMI 25.11 kg/m  Physical Exam Vitals and nursing note reviewed.  Constitutional:      Appearance: She is well-developed.  HENT:     Head: Normocephalic.     Mouth/Throat:     Mouth: Mucous membranes are moist.  Eyes:     Extraocular Movements: Extraocular movements intact.     Pupils: Pupils are equal, round, and reactive to light.  Cardiovascular:     Rate and  Rhythm: Normal rate and regular rhythm.  Pulmonary:     Effort: Pulmonary effort is normal.  Abdominal:     General: Abdomen is flat. Bowel sounds are normal. There is no distension.  Musculoskeletal:        General: Normal range of motion.     Cervical back: Normal range of motion.  Skin:    General: Skin is warm.  Neurological:     General: No focal deficit present.     Mental Status: She is alert and oriented to person, place, and time.     ED Results / Procedures / Treatments   Labs (all labs ordered are listed, but only abnormal results are displayed) Labs Reviewed  CBC WITH DIFFERENTIAL/PLATELET - Abnormal; Notable for the following components:      Result Value   Hemoglobin 9.0 (*)    HCT 29.0 (*)    MCV 71.4 (*)     MCH 22.2 (*)    RDW 17.0 (*)    Platelets 405 (*)    All other components within normal limits  COMPREHENSIVE METABOLIC PANEL - Abnormal; Notable for the following components:   Glucose, Bld 108 (*)    AST 13 (*)    Total Bilirubin 0.2 (*)    All other components within normal limits    EKG None  Radiology DG Chest 2 View  Result Date: 09/21/2022 CLINICAL DATA:  Provided history: Coughing up blood. Hemoptysis. History of esophagitis. EXAM: CHEST - 2 VIEW COMPARISON:  No pertinent prior exams available for comparison. FINDINGS: Heart size within normal limits. No appreciable airspace consolidation. No evidence of pleural effusion or pneumothorax. No acute bony abnormality identified. IMPRESSION: No evidence of active cardiopulmonary disease. Electronically Signed   By: Kellie Simmering D.O.   On: 09/21/2022 12:10    Procedures Procedures    Medications Ordered in ED Medications - No data to display  ED Course/ Medical Decision Making/ A&P                           Medical Decision Making Patient reports she noticed some blood after coughing.  Patient reports coughing was after vomiting.  Patient has a history of bulimia  Amount and/or Complexity of Data Reviewed External Data Reviewed: notes.    Details: GI notes reviewed Labs: ordered. Decision-making details documented in ED Course.    Details: As a hemoglobin of 9.0 glucose is 108.  Hemoglobin is at patient's baseline.  Labs are ordered reviewed and interpreted Radiology: ordered and independent interpretation performed. Decision-making details documented in ED Course.    Details: Chest x-ray shows no acute abnormality  Risk Risk Details: I discussed patient's results with her.  Patient is advised to follow-up with her physician as scheduled.           Final Clinical Impression(s) / ED Diagnoses Final diagnoses:  Anemia, unspecified type    Rx / DC Orders ED Discharge Orders     None      An After  Visit Summary was printed and given to the patient.    Fransico Meadow, [place] 09/21/22 1603    Hayden Rasmussen, MD 09/21/22 [phone]

## 2022-09-21 NOTE — ED Notes (Signed)
Pt given a UA cup for specimen. No orders at this time

## 2022-09-21 NOTE — Discharge Instructions (Signed)
See your Physician for recheck.  Make sure you are taking a vitamin with iron.

## 2022-10-03 IMAGING — CT CT HEAD W/O CM
4 series · 16 of 47 positions shown, 18 images · non-contrast
Comparison: None.

CLINICAL DATA: Psychosis.  Confusion.  Bizarre behavior.



[Series 3: head bone · axial · 0.44mm/px · z∈[-387,-355]mm · 3 of 81 slices shown]
[im 9/81  bone]
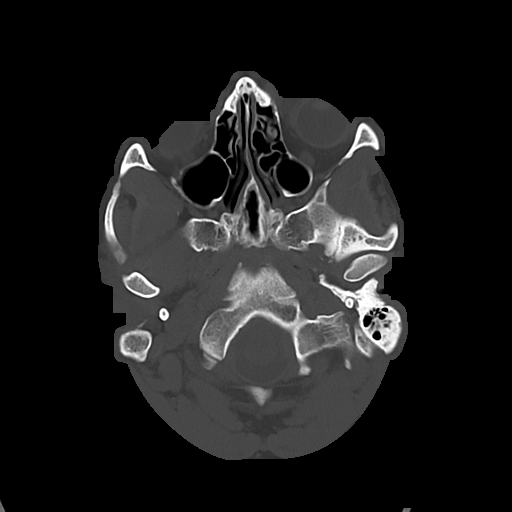
[im 17/81  bone]
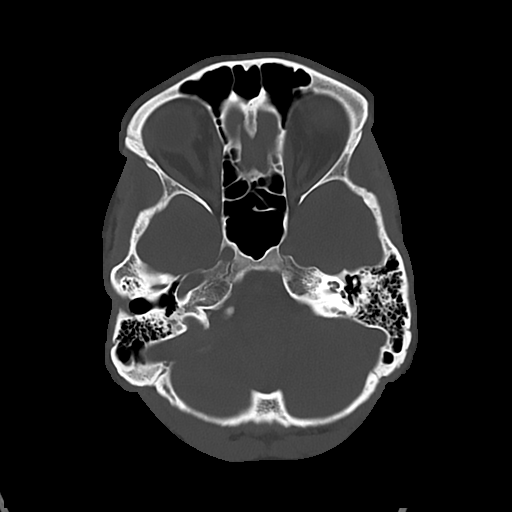
[im 25/81  bone]
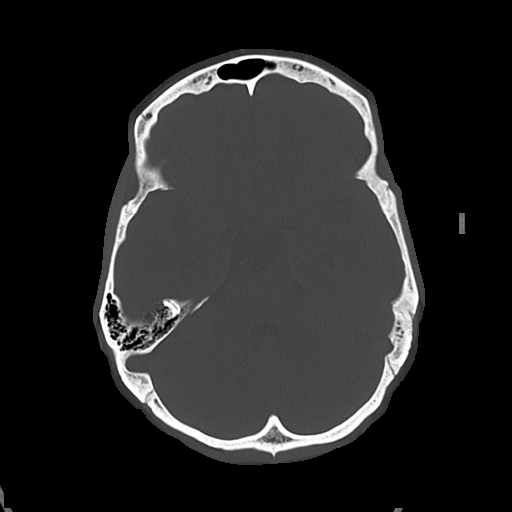

[Series 4: cor soft · coronal · 0.35mm/px · 3 of 84 slices shown]
[im 28/84  brain]
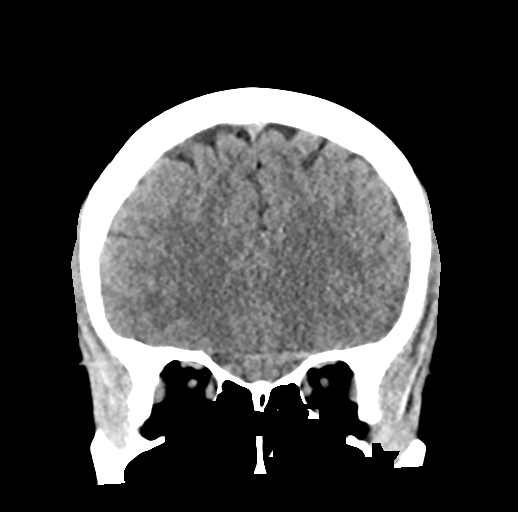
[im 37/84  brain]
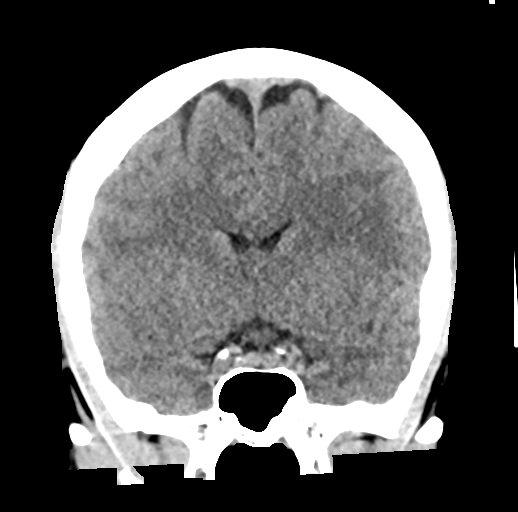
[im 47/84  brain]
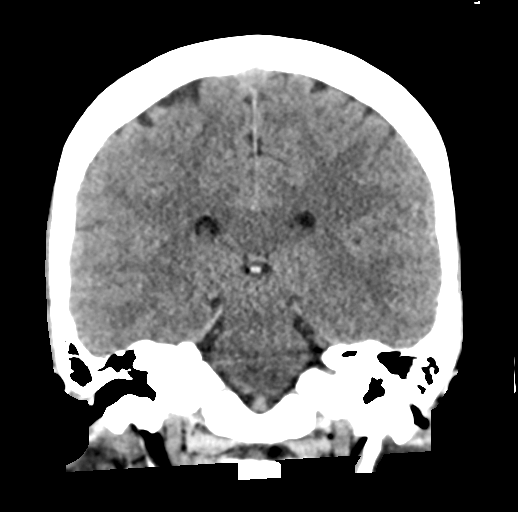

[Series 5: head wo · axial · 0.44mm/px · z∈[-383,-263]mm · 7 of 33 slices shown, 9 images]
[im 5/33  brain]
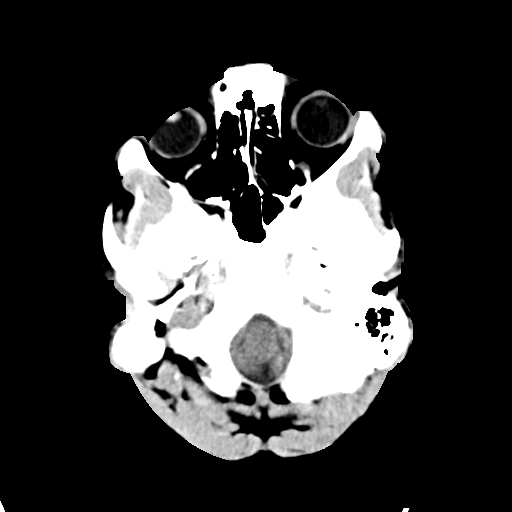
[im 5/33  bone]
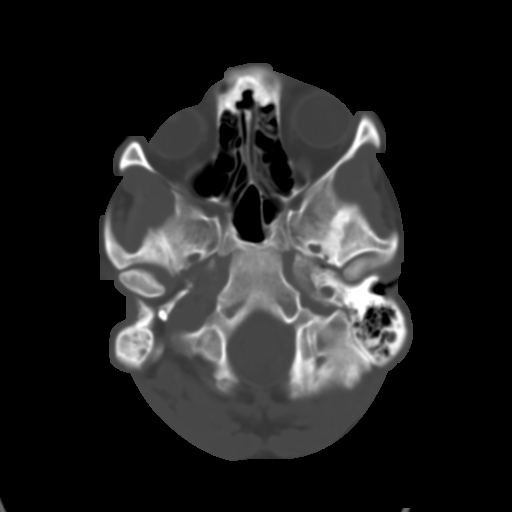
[im 9/33  brain]
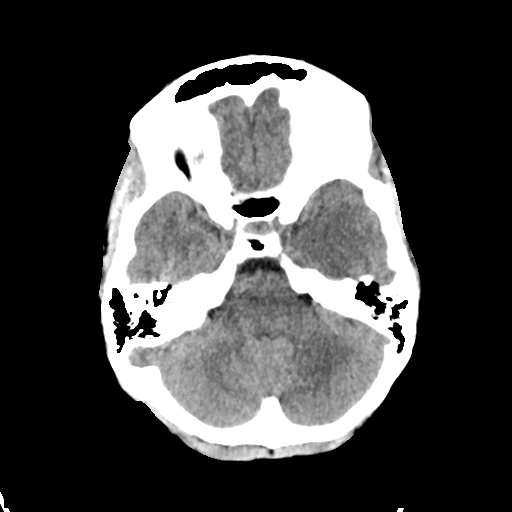
[im 13/33  brain]
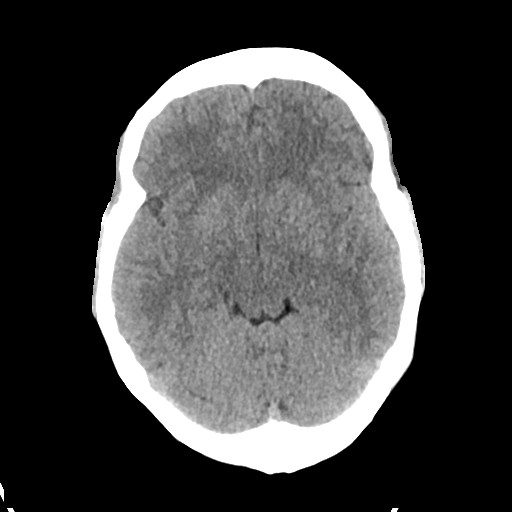
[im 17/33  brain]
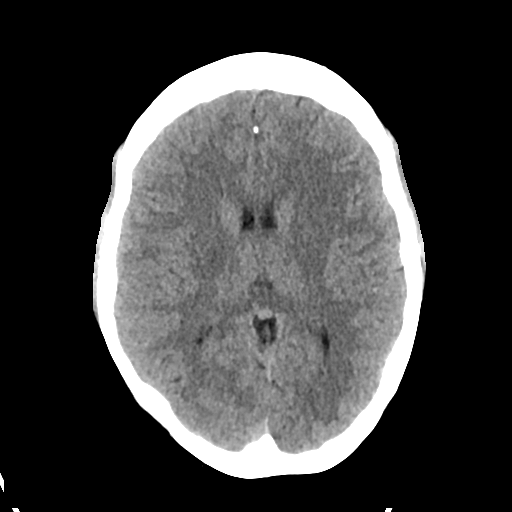
[im 21/33  brain]
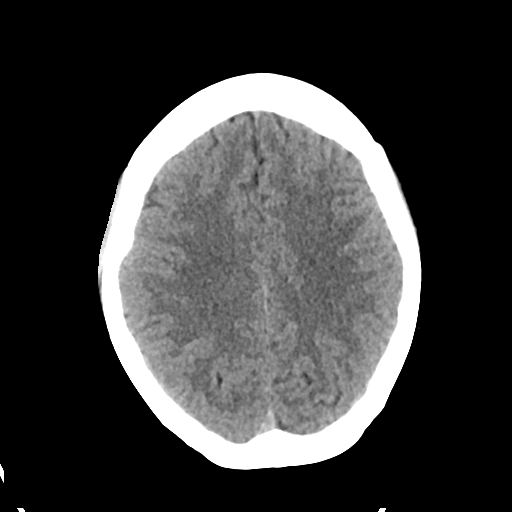
[im 21/33  bone]
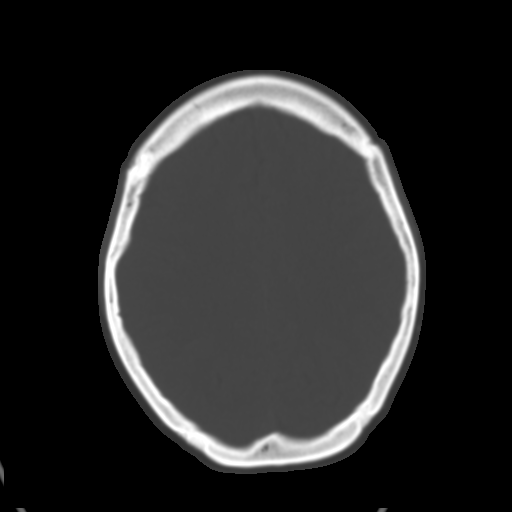
[im 25/33  brain]
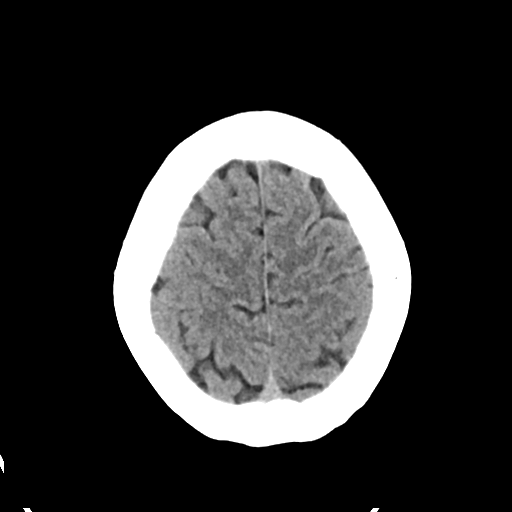
[im 29/33  brain]
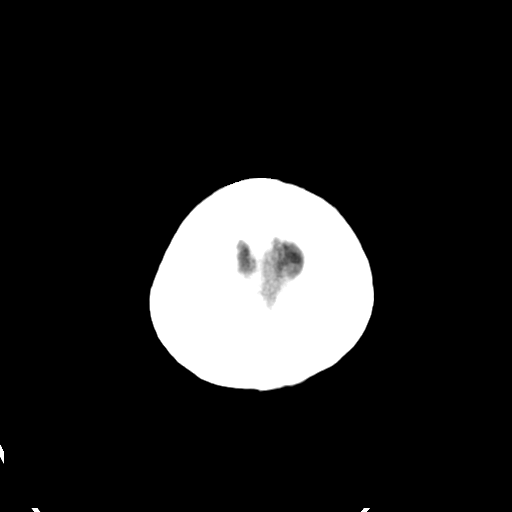

[Series 6: sag soft · sagittal · 0.35mm/px · 3 of 60 slices shown]
[im 20/60  brain]
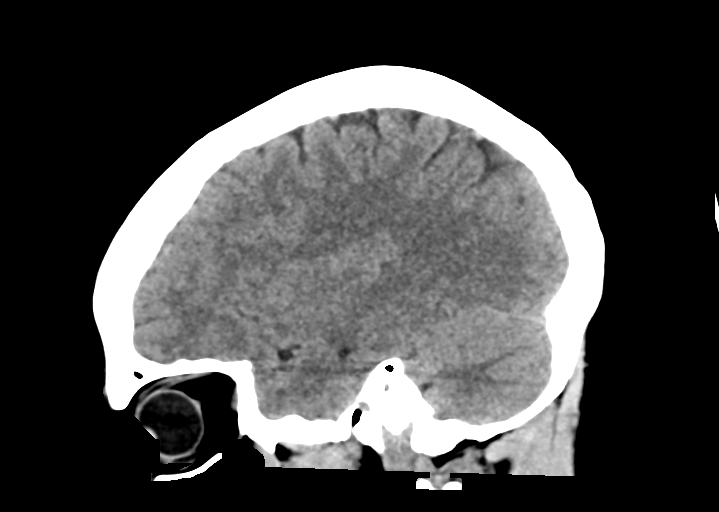
[im 30/60  brain]
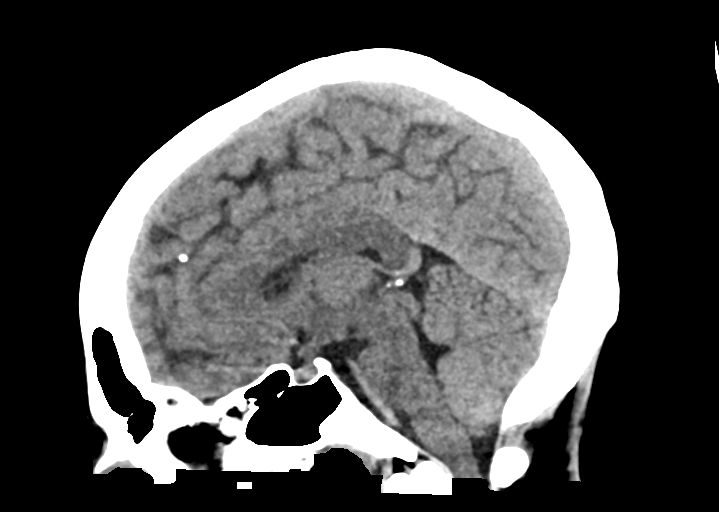
[im 40/60  brain]
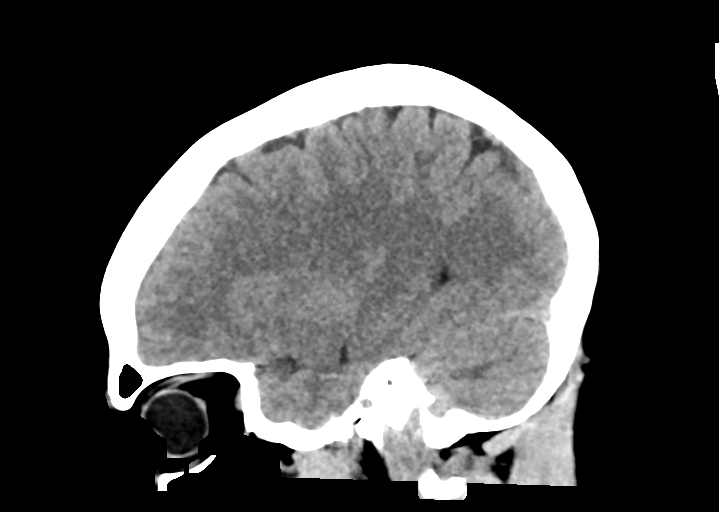

[16 of 47 positions shown; findings below may reference images not displayed]

FINDINGS: Brain: The brain shows a normal appearance without evidence of
malformation, atrophy, old or acute small or large vessel
infarction, mass lesion, hemorrhage, hydrocephalus or extra-axial
collection.

Vascular: No hyperdense vessel. No evidence of atherosclerotic
calcification.

Skull: Normal.  No traumatic finding.  No focal bone lesion.

Sinuses/Orbits: Sinuses are clear. Orbits appear normal. Mastoids
are clear.

Other: None significant
IMPRESSION: Normal head CT.

## 2022-11-13 ENCOUNTER — Telehealth: Payer: BLUE CROSS/BLUE SHIELD | Admitting: Urgent Care

## 2022-11-13 DIAGNOSIS — N3000 Acute cystitis without hematuria: Secondary | ICD-10-CM

## 2022-11-13 MED ORDER — NITROFURANTOIN MONOHYD MACRO 100 MG PO CAPS: 100.0000 mg | ORAL_CAPSULE | Freq: Two times a day (BID) | ORAL | 0 refills | Status: AC

## 2022-11-13 MED ORDER — PHENAZOPYRIDINE HCL 100 MG PO TABS: 100.0000 mg | ORAL_TABLET | Freq: Three times a day (TID) | ORAL | 0 refills | Status: AC | PRN

## 2022-11-13 NOTE — Patient Instructions (Signed)
Adore Ignacia Palma, thank you for joining Maretta Bees, PA for today's virtual visit.  While this provider is not your primary care provider (PCP), if your PCP is located in our provider database this encounter information will be shared with them immediately following your visit.   A [facility] MyChart account gives you access to today's visit and all your visits, tests, and labs performed at [facility] " click here if you don't have a [facility] MyChart account or go to [email or web address]  Consent: (Patient) Danielle Prince provided verbal consent for this virtual visit at the beginning of the encounter.  Current Medications:  Current Outpatient Medications:    nitrofurantoin, macrocrystal-monohydrate, (MACROBID) 100 MG capsule, Take 1 capsule (100 mg total) by mouth 2 (two) times daily for 5 days., Disp: 10 capsule, Rfl: 0   phenazopyridine (PYRIDIUM) 100 MG tablet, Take 1 tablet (100 mg total) by mouth 3 (three) times daily as needed for pain., Disp: 6 tablet, Rfl: 0   ARIPiprazole (ABILIFY) 10 MG tablet, Take 10 mg by mouth daily., Disp: , Rfl:    buPROPion (WELLBUTRIN XL) 300 MG 24 hr tablet, Take 300 mg by mouth every morning., Disp: , Rfl:    Dexmethylphenidate HCl 40 MG CP24, Take 1 capsule by mouth every morning., Disp: , Rfl:    escitalopram (LEXAPRO) 20 MG tablet, Take 20 mg by mouth daily., Disp: , Rfl:    gabapentin (NEURONTIN) 300 MG capsule, Take 1 capsule (300 mg total) by mouth 3 (three) times daily., Disp: 90 capsule, Rfl: 2   hydrOXYzine (VISTARIL) 100 MG capsule, Take 100 mg by mouth at bedtime as needed for anxiety., Disp: , Rfl:    mirtazapine (REMERON) 15 MG tablet, Take 15 mg by mouth at bedtime as needed (sleep)., Disp: , Rfl:    OLANZapine (ZYPREXA) 5 MG tablet, Take 5 mg by mouth at bedtime., Disp: , Rfl:    omeprazole (PRILOSEC) 20 MG capsule, Take 1 capsule (20 mg total) by mouth 2 (two) times daily before a meal., Disp: 60 capsule, Rfl:  11   Medications ordered in this encounter:  Meds ordered this encounter  Medications   nitrofurantoin, macrocrystal-monohydrate, (MACROBID) 100 MG capsule    Sig: Take 1 capsule (100 mg total) by mouth 2 (two) times daily for 5 days.    Dispense:  10 capsule    Refill:  0    Order Specific Question:   Supervising Provider    Answer:   Merrilee Jansky [[id number]]   phenazopyridine (PYRIDIUM) 100 MG tablet    Sig: Take 1 tablet (100 mg total) by mouth 3 (three) times daily as needed for pain.    Dispense:  6 tablet    Refill:  0    Order Specific Question:   Supervising Provider    Answer:   Merrilee Jansky [id number]     *If you need refills on other medications prior to your next appointment, please contact your pharmacy*  Follow-Up: Call back or seek an in-person evaluation if the symptoms worsen or if the condition fails to improve as anticipated.  [facility] [phone]  Other Instructions Your symptoms are consistent with a urinary tract infection. Start taking the antibiotic twice daily until completed. You can take Pyridium up to 3 times daily for maximum of 2 days, this is only to help with the frequency and the pain.  Take on an as-needed basis. Your urine will turn orange while taking this medicaiton. Increase your water  intake. Make sure to always wipe front to back. Monitor for any change in or worsening symptoms; fever, hematuria or flank pain would warrant a recheck    If you have been instructed to have an in-person evaluation today at a local Urgent Care facility, please use the link below. It will take you to a list of all of our available [facility], including address, phone number and hours of operation. Please do not delay care.  [facility]  If you or a family member do not have a primary care provider, use the link below to schedule a visit and establish care. When you choose a [facility] primary care physician  or advanced practice provider, you gain a long-term partner in health. Find a Primary Care Provider  Learn more about [facility] in-office and virtual care options: [facility]

## 2022-11-13 NOTE — Progress Notes (Signed)
Virtual Visit Consent   Danielle Prince, you are scheduled for a virtual visit with a [facility] provider today. Just as with appointments in the office, your consent must be obtained to participate. Your consent will be active for this visit and any virtual visit you may have with one of our providers in the next 365 days. If you have a MyChart account, a copy of this consent can be sent to you electronically.  As this is a virtual visit, video technology does not allow for your provider to perform a traditional examination. This may limit your provider's ability to fully assess your condition. If your provider identifies any concerns that need to be evaluated in person or the need to arrange testing (such as labs, EKG, etc.), we will make arrangements to do so. Although advances in technology are sophisticated, we cannot ensure that it will always work on either your end or our end. If the connection with a video visit is poor, the visit may have to be switched to a telephone visit. With either a video or telephone visit, we are not always able to ensure that we have a secure connection.  By engaging in this virtual visit, you consent to the provision of healthcare and authorize for your insurance to be billed (if applicable) for the services provided during this visit. Depending on your insurance coverage, you may receive a charge related to this service.  I need to obtain your verbal consent now. Are you willing to proceed with your visit today? Danielle Prince has provided verbal consent on 11/13/2022 for a virtual visit (video or telephone). Danielle Malling, PA  Date: 11/13/2022 1:28 PM  Virtual Visit via Video Note   I, Danielle Prince, connected with  Danielle Prince  ([id number], 12/10/1996) on 11/13/22 at  1:15 PM EST by a video-enabled telemedicine application and verified that I am speaking with the correct person using two identifiers.  Location: Patient: Virtual Visit Location  Patient: Home Provider: Virtual Visit Location Provider: Home Office   I discussed the limitations of evaluation and management by telemedicine and the availability of in person appointments. The patient expressed understanding and agreed to proceed.    History of Present Illness: Danielle Prince is a 26 y.o. who identifies as a female who was assigned female at birth, and is being seen today for UTI.  HPI: 26yo female presents today with dysuria, frequency, hesitancy x 2-3 days. States she is not sexually active so is not worried about STI. Denies pelvic pain or discharge. No rash. Denies flank pain, fever, hematuria. Has been taking AZO OTC with minimal relief. Hx UTI in the past, feels similar.   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[place])    Bipolar depression manic phase ([place]) 11/16/2019   Depression    MDD (major depressive disorder), recurrent episode ([place]) 02/09/2017   Dyspareunia 04/15/2014   [place] grade D esophagitis 10/28/2013   Generalized anxiety disorder 03/02/2012   Oppositional defiant disorder 03/02/2012   Bulimia nervosa 02/26/2012   Severe episode of recurrent major depressive disorder, without psychotic features ([place]) 02/26/2012    Allergies:  Allergies  Allergen Reactions   Sodium Ferric Gluconate [Ferrous Gluconate] Shortness Of Breath, Anxiety,  Rash and Cough    Reaction occurred in 2014 during Furlecit infusion. Patient received 125 mg Solu-medrol and symptoms resolved.    Medications:  Current Outpatient Medications:    nitrofurantoin, macrocrystal-monohydrate, (MACROBID) 100 MG capsule, Take 1 capsule (100 mg total) by mouth 2 (two) times  daily for 5 days., Disp: 10 capsule, Rfl: 0   phenazopyridine (PYRIDIUM) 100 MG tablet, Take 1 tablet (100 mg total) by mouth 3 (three) times daily as needed for pain., Disp: 6 tablet, Rfl: 0   ARIPiprazole (ABILIFY) 10 MG tablet, Take 10 mg by mouth daily., Disp: , Rfl:    buPROPion (WELLBUTRIN XL) 300 MG 24 hr tablet, Take 300 mg by mouth every morning., Disp: , Rfl:    Dexmethylphenidate HCl 40 MG CP24, Take 1 capsule by mouth every morning., Disp: , Rfl:    escitalopram (LEXAPRO) 20 MG tablet, Take 20 mg by mouth daily., Disp: , Rfl:    gabapentin (NEURONTIN) 300 MG capsule, Take 1 capsule (300 mg total) by mouth 3 (three) times daily., Disp: 90 capsule, Rfl: 2   hydrOXYzine (VISTARIL) 100 MG capsule, Take 100 mg by mouth at bedtime as needed for anxiety., Disp: , Rfl:    mirtazapine (REMERON) 15 MG tablet, Take 15 mg by mouth at bedtime as needed (sleep)., Disp: , Rfl:    OLANZapine (ZYPREXA) 5 MG tablet, Take 5 mg by mouth at bedtime., Disp: , Rfl:    omeprazole (PRILOSEC) 20 MG capsule, Take 1 capsule (20 mg total) by mouth 2 (two) times daily before a meal., Disp: 60 capsule, Rfl: 11  Observations/Objective: Patient is well-developed, well-nourished in no acute distress.  Resting comfortably at home. Pt appears well. Head is normocephalic, atraumatic.  No labored breathing.  Speech is clear and coherent with logical content.  Patient is alert and oriented at baseline.    Assessment and Plan: 1. Acute cystitis without hematuria  Pt with what sounds like simple cystitis. She denies sx consistent with pyelonephritis. Will start PO macrobid BID x 5 days. Pyridium x 2 days PRN. Increase water.   Follow Up Instructions: I discussed the assessment and treatment plan with the patient. The patient was provided an opportunity to ask questions and all were answered. The patient agreed with the plan and demonstrated an understanding of the instructions.  A copy of instructions were sent to the  patient via MyChart unless otherwise noted below.     The patient was advised to call back or seek an in-person evaluation if the symptoms worsen or if the condition fails to improve as anticipated.  Time:  I spent 7 minutes with the patient via telehealth technology discussing the above problems/concerns.    Happy Begeman L Makani Seckman, PA

## 2022-11-21 NOTE — Progress Notes (Deleted)
   New Patient Office Visit  Subjective:  Patient ID: Danielle Prince, female    DOB: 1996/01/29  Age: 26 y.o. MRN: [id number]  CC: No chief complaint on file.   HPI Danielle Prince presents for establishing care today.  Assessment & Plan:   Problem List Items Addressed This Visit   None   Subjective:    Outpatient Medications Prior to Visit  Medication Sig Dispense Refill  . ARIPiprazole (ABILIFY) 10 MG tablet Take 10 mg by mouth daily.    Marland Kitchen buPROPion (WELLBUTRIN XL) 300 MG 24 hr tablet Take 300 mg by mouth every morning.    Marland Kitchen Dexmethylphenidate HCl 40 MG CP24 Take 1 capsule by mouth every morning.    . escitalopram (LEXAPRO) 20 MG tablet Take 20 mg by mouth daily.    Marland Kitchen gabapentin (NEURONTIN) 300 MG capsule Take 1 capsule (300 mg total) by mouth 3 (three) times daily. 90 capsule 2  . hydrOXYzine (VISTARIL) 100 MG capsule Take 100 mg by mouth at bedtime as needed for anxiety.    . mirtazapine (REMERON) 15 MG tablet Take 15 mg by mouth at bedtime as needed (sleep).    . OLANZapine (ZYPREXA) 5 MG tablet Take 5 mg by mouth at bedtime.    Marland Kitchen omeprazole (PRILOSEC) 20 MG capsule Take 1 capsule (20 mg total) by mouth 2 (two) times daily before a meal. 60 capsule 11  . phenazopyridine (PYRIDIUM) 100 MG tablet Take 1 tablet (100 mg total) by mouth 3 (three) times daily as needed for pain. 6 tablet 0   No facility-administered medications prior to visit.   Past Medical History:  Diagnosis Date  . ADHD (attention deficit hyperactivity disorder)   . Anorexia nervosa with bulimia   . Anxiety   . Aspirin overdose 02/18/2012  . Depression   . Dysrhythmia   . Electrolyte disturbance 2010, 2012   hypokalemia, hypochloremia.  . Urinary tract infection   . Varicella    Past Surgical History:  Procedure Laterality Date  . BIOPSY  02/12/2022   Procedure: BIOPSY;  Surgeon: Lynann Bologna, MD;  Location: [facility] ENDOSCOPY;  Service: Gastroenterology;;  . COLONOSCOPY WITH  ESOPHAGOGASTRODUODENOSCOPY (EGD) N/A 10/28/2013   Procedure: COLONOSCOPY WITH ESOPHAGOGASTRODUODENOSCOPY (EGD);  Surgeon: Beverley Fiedler, MD;  Location[facility] ENDOSCOPY;  Service: Endoscopy;  Laterality: N/A;  . ESOPHAGOGASTRODUODENOSCOPY (EGD) WITH PROPOFOL N/A 02/12/2022   Procedure: ESOPHAGOGASTRODUODENOSCOPY (EGD) WITH PROPOFOL;  Surgeon: Lynann Bologna, MD;  Location: [facility] ENDOSCOPY;  Service: Gastroenterology;  Laterality: N/A;  . GUM SURGERY    . INDUCED ABORTION      Objective:   Today's Vitals: There were no vitals taken for this visit.  Physical Exam Vitals and nursing note reviewed.  Constitutional:      Appearance: Normal appearance.  Cardiovascular:     Rate and Rhythm: Normal rate and regular rhythm.  Pulmonary:     Effort: Pulmonary effort is normal.     Breath sounds: Normal breath sounds.  Musculoskeletal:        General: Normal range of motion.  Skin:    General: Skin is warm and dry.  Neurological:     Mental Status: She is alert.  Psychiatric:        Mood and Affect: Mood normal.        Behavior: Behavior normal.   No orders of the defined types were placed in this encounter.   Dulce Sellar, NP

## 2022-11-22 ENCOUNTER — Ambulatory Visit: Payer: BLUE CROSS/BLUE SHIELD | Admitting: Family

## 2022-11-24 ENCOUNTER — Encounter: Payer: BLUE CROSS/BLUE SHIELD | Admitting: Radiology

## 2022-11-24 NOTE — Progress Notes (Signed)
Erroneous encounter

## 2022-12-02 ENCOUNTER — Encounter: Payer: Self-pay | Admitting: *Deleted

## 2022-12-26 ENCOUNTER — Telehealth (INDEPENDENT_AMBULATORY_CARE_PROVIDER_SITE_OTHER): Payer: Self-pay

## 2022-12-26 ENCOUNTER — Telehealth: Payer: BLUE CROSS/BLUE SHIELD | Admitting: Physician Assistant

## 2022-12-26 DIAGNOSIS — L0291 Cutaneous abscess, unspecified: Secondary | ICD-10-CM | POA: Diagnosis not present

## 2022-12-26 MED ORDER — BACITRACIN 500 UNIT/GM EX OINT: 1.0000 | TOPICAL_OINTMENT | Freq: Two times a day (BID) | CUTANEOUS | 0 refills | Status: AC

## 2022-12-26 MED ORDER — SULFAMETHOXAZOLE-TRIMETHOPRIM 800-160 MG PO TABS: 1.0000 | ORAL_TABLET | Freq: Two times a day (BID) | ORAL | 0 refills | Status: AC

## 2022-12-26 NOTE — Progress Notes (Signed)
Virtual Visit Consent   Danielle Prince, you are scheduled for a virtual visit with a [facility] provider today. Just as with appointments in the office, your consent must be obtained to participate. Your consent will be active for this visit and any virtual visit you may have with one of our providers in the next 365 days. If you have a MyChart account, a copy of this consent can be sent to you electronically.  As this is a virtual visit, video technology does not allow for your provider to perform a traditional examination. This may limit your provider's ability to fully assess your condition. If your provider identifies any concerns that need to be evaluated in person or the need to arrange testing (such as labs, EKG, etc.), we will make arrangements to do so. Although advances in technology are sophisticated, we cannot ensure that it will always work on either your end or our end. If the connection with a video visit is poor, the visit may have to be switched to a telephone visit. With either a video or telephone visit, we are not always able to ensure that we have a secure connection.  By engaging in this virtual visit, you consent to the provision of healthcare and authorize for your insurance to be billed (if applicable) for the services provided during this visit. Depending on your insurance coverage, you may receive a charge related to this service.  I need to obtain your verbal consent now. Are you willing to proceed with your visit today? Brynlei Zaragoza has provided verbal consent on 12/26/2022 for a virtual visit (video or telephone). Lenise Arena Ward, PA-C  Date: 12/26/2022 6:46 PM  Virtual Visit via Video Note   I, Lenise Arena Ward, connected with  Lashelle Koy  ([id number], [birth year]) on 12/26/22 at  6:30 PM EST by a video-enabled telemedicine application and verified that I am speaking with the correct person using two identifiers.  Location: Patient: Virtual Visit Location  Patient: Home Provider: Virtual Visit Location Provider: Home Office   I discussed the limitations of evaluation and management by telemedicine and the availability of in person appointments. The patient expressed understanding and agreed to proceed.    History of Present Illness: Danielle Prince is a 27 y.o. who identifies as a female who was assigned female at birth, and is being seen today for an area of redness and swelling to the anterior left upper leg that started several days ago.  She reports it is associated with IV drug use. She reports the area is very firm.  She denies fever, chills, sweats. She has tried nothing for the sx.   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[place])    Bipolar depression manic phase ([place]) 11/16/2019   Depression    MDD (major depressive disorder), recurrent episode ([place]) 02/09/2017   Dyspareunia 04/15/2014   [place] grade D esophagitis 10/28/2013   Generalized anxiety disorder 03/02/2012   Oppositional defiant disorder 03/02/2012   Bulimia nervosa 02/26/2012   Severe episode of recurrent major depressive disorder, without psychotic features ([place]) 02/26/2012    Allergies:  Allergies  Allergen Reactions   Sodium Ferric Gluconate [Ferrous Gluconate] Shortness Of Breath, Anxiety, Rash and Cough  Reaction occurred in 2014 during Furlecit infusion. Patient received 125 mg Solu-medrol and symptoms resolved.    Medications:  Current Outpatient Medications:    bacitracin 500 UNIT/GM ointment, Apply 1 Application topically 2 (two) times daily., Disp: 15 g, Rfl: 0   sulfamethoxazole-trimethoprim (BACTRIM DS) 800-160 MG tablet, Take 1  tablet by mouth 2 (two) times daily for 5 days., Disp: 10 tablet, Rfl: 0   ARIPiprazole (ABILIFY) 10 MG tablet, Take 10 mg by mouth daily., Disp: , Rfl:    buPROPion (WELLBUTRIN XL) 300 MG 24 hr tablet, Take 300 mg by mouth every morning., Disp: , Rfl:    Dexmethylphenidate HCl 40 MG CP24, Take 1 capsule by mouth every morning., Disp: , Rfl:    escitalopram (LEXAPRO) 20 MG tablet, Take 20 mg by mouth daily., Disp: , Rfl:    gabapentin (NEURONTIN) 300 MG capsule, Take 1 capsule (300 mg total) by mouth 3 (three) times daily., Disp: 90 capsule, Rfl: 2   hydrOXYzine (VISTARIL) 100 MG capsule, Take 100 mg by mouth at bedtime as needed for anxiety., Disp: , Rfl:    mirtazapine (REMERON) 15 MG tablet, Take 15 mg by mouth at bedtime as needed (sleep)., Disp: , Rfl:    OLANZapine (ZYPREXA) 5 MG tablet, Take 5 mg by mouth at bedtime., Disp: , Rfl:    omeprazole (PRILOSEC) 20 MG capsule, Take 1 capsule (20 mg total) by mouth 2 (two) times daily before a meal., Disp: 60 capsule, Rfl: 11   phenazopyridine (PYRIDIUM) 100 MG tablet, Take 1 tablet (100 mg total) by mouth 3 (three) times daily as needed for pain., Disp: 6 tablet, Rfl: 0  Observations/Objective: Patient is well-developed, well-nourished in no acute distress.  Resting comfortably  at home.  Head is normocephalic, atraumatic.  No labored breathing.  Speech is clear and coherent with logical content.  Patient is alert and oriented at baseline.    Assessment and Plan: 1. Abscess - sulfamethoxazole-trimethoprim (BACTRIM DS) 800-160 MG tablet; Take 1 tablet by mouth 2 (two) times daily for 5 days.  Dispense: 10 tablet; Refill: 0 - bacitracin 500 UNIT/GM ointment; Apply 1 Application topically 2 (two) times daily.  Dispense: 15 g; Refill: 0  Advised warm compress.  ED precautions given.  Advised follow up in UC if no improvement after 2-3 days for I&D which ultimately might be the best treatment.   Follow Up Instructions: I discussed the  assessment and treatment plan with the patient. The patient was provided an opportunity to ask questions and all were answered. The patient agreed with the plan and demonstrated an understanding of the instructions.  A copy of instructions were sent to the patient via MyChart unless otherwise noted below.     The patient was advised to call back or seek an in-person evaluation if the symptoms worsen or if the condition fails to improve as anticipated.  Time:  I spent 19 minutes with the patient via telehealth technology discussing the above problems/concerns.    Tylene Fantasia Ward, PA-C

## 2022-12-26 NOTE — Patient Instructions (Signed)
Caren Monia Pouch, thank you for joining Lenise Arena Ward, PA-C for today's virtual visit.  While this provider is not your primary care provider (PCP), if your PCP is located in our provider database this encounter information will be shared with them immediately following your visit.   [facility] account gives you access to today's visit and all your visits, tests, and labs performed at [facility] " click here if you don't have a [facility] account or go to [email or web address]  Consent: (Patient) Danielle Prince provided verbal consent for this virtual visit at the beginning of the encounter.  Current Medications:  Current Outpatient Medications:    bacitracin 500 UNIT/GM ointment, Apply 1 Application topically 2 (two) times daily., Disp: 15 g, Rfl: 0   sulfamethoxazole-trimethoprim (BACTRIM DS) 800-160 MG tablet, Take 1 tablet by mouth 2 (two) times daily for 5 days., Disp: 10 tablet, Rfl: 0   ARIPiprazole (ABILIFY) 10 MG tablet, Take 10 mg by mouth daily., Disp: , Rfl:    buPROPion (WELLBUTRIN XL) 300 MG 24 hr tablet, Take 300 mg by mouth every morning., Disp: , Rfl:    Dexmethylphenidate HCl 40 MG CP24, Take 1 capsule by mouth every morning., Disp: , Rfl:    escitalopram (LEXAPRO) 20 MG tablet, Take 20 mg by mouth daily., Disp: , Rfl:    gabapentin (NEURONTIN) 300 MG capsule, Take 1 capsule (300 mg total) by mouth 3 (three) times daily., Disp: 90 capsule, Rfl: 2   hydrOXYzine (VISTARIL) 100 MG capsule, Take 100 mg by mouth at bedtime as needed for anxiety., Disp: , Rfl:    mirtazapine (REMERON) 15 MG tablet, Take 15 mg by mouth at bedtime as needed (sleep)., Disp: , Rfl:    OLANZapine (ZYPREXA) 5 MG tablet, Take 5 mg by mouth at bedtime., Disp: , Rfl:    omeprazole (PRILOSEC) 20 MG capsule, Take 1 capsule (20 mg total) by mouth 2 (two) times daily before a meal., Disp: 60 capsule, Rfl: 11   phenazopyridine (PYRIDIUM) 100 MG tablet, Take 1 tablet (100  mg total) by mouth 3 (three) times daily as needed for pain., Disp: 6 tablet, Rfl: 0   Medications ordered in this encounter:  Meds ordered this encounter  Medications   sulfamethoxazole-trimethoprim (BACTRIM DS) 800-160 MG tablet    Sig: Take 1 tablet by mouth 2 (two) times daily for 5 days.    Dispense:  10 tablet    Refill:  0    Order Specific Question:   Supervising Provider    Answer:   Chase Picket [[id number]]   bacitracin 500 UNIT/GM ointment    Sig: Apply 1 Application topically 2 (two) times daily.    Dispense:  15 g    Refill:  0    Order Specific Question:   Supervising Provider    Answer:   Chase Picket [id number]     *If you need refills on other medications prior to your next appointment, please contact your pharmacy*  Follow-Up: Call back or seek an in-person evaluation if the symptoms worsen or if the condition fails to improve as anticipated.  [facility] [phone]  Other Instructions Apply warm compress several times per day.  If no improvement follow up for a face to face visit with Urgent Care.  It may need to be drained.  If you develop fevers, chills, increased pain follow up sooner.    If you have been instructed to have an in-person evaluation today at a  local Urgent Care facility, please use the link below. It will take you to a list of all of our available [facility], including address, phone number and hours of operation. Please do not delay care.  [facility]  If you or a family member do not have a primary care provider, use the link below to schedule a visit and establish care. When you choose a [facility] primary care physician or advanced practice provider, you gain a long-term partner in health. Find a Primary Care Provider  Learn more about [facility]'s in-office and virtual care options: [facility]

## 2022-12-27 NOTE — Progress Notes (Unsigned)
Erroneous error, pt scheduled video visit instead.

## 2023-01-05 ENCOUNTER — Telehealth: Payer: BLUE CROSS/BLUE SHIELD | Admitting: Physician Assistant

## 2023-01-05 ENCOUNTER — Encounter: Payer: BLUE CROSS/BLUE SHIELD | Admitting: Nurse Practitioner

## 2023-01-05 DIAGNOSIS — L02416 Cutaneous abscess of left lower limb: Secondary | ICD-10-CM

## 2023-01-05 DIAGNOSIS — L03116 Cellulitis of left lower limb: Secondary | ICD-10-CM

## 2023-01-05 NOTE — Progress Notes (Signed)
Virtual Visit Consent   Danielle Prince, you are scheduled for a virtual visit with a [facility] provider today. Just as with appointments in the office, your consent must be obtained to participate. Your consent will be active for this visit and any virtual visit you may have with one of our providers in the next 365 days. If you have a MyChart account, a copy of this consent can be sent to you electronically.  As this is a virtual visit, video technology does not allow for your provider to perform a traditional examination. This may limit your provider's ability to fully assess your condition. If your provider identifies any concerns that need to be evaluated in person or the need to arrange testing (such as labs, EKG, etc.), we will make arrangements to do so. Although advances in technology are sophisticated, we cannot ensure that it will always work on either your end or our end. If the connection with a video visit is poor, the visit may have to be switched to a telephone visit. With either a video or telephone visit, we are not always able to ensure that we have a secure connection.  By engaging in this virtual visit, you consent to the provision of healthcare and authorize for your insurance to be billed (if applicable) for the services provided during this visit. Depending on your insurance coverage, you may receive a charge related to this service.  I need to obtain your verbal consent now. Are you willing to proceed with your visit today? Danielle Prince has provided verbal consent on 01/05/2023 for a virtual visit (video or telephone). Danielle Prince, [place]  Date: 01/05/2023 1:17 PM  Virtual Visit via Video Note   I, Danielle Prince, connected with  Danielle Prince  ([id number], 06-22-1996) on 01/05/23 at  1:00 PM EST by a video-enabled telemedicine application and verified that I am speaking with the correct person using two identifiers.  Location: Patient: Virtual Visit  Location Patient: Home Provider: Virtual Visit Location Provider: Home Office   I discussed the limitations of evaluation and management by telemedicine and the availability of in person appointments. The patient expressed understanding and agreed to proceed.    History of Present Illness: Danielle Prince is a 27 y.o. who identifies as a female who was assigned female at birth, and is being seen today for questions about area of cellulitis/abscess she was recently evaluated and treated for. Patient seen via video on 12/26/22 with concerns of cellulitis and abscess of LLE. Was started on Bactrim for 5 days. Notes taking medication as directed, completing entire course of antibiotic. Symptoms have resolved except for some overlying skin scabbing and hardness at the initial site of infection. She is unsure if she needs additional antibiotics, etc.   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[place])    Bipolar depression manic phase ([place]) 11/16/2019   Depression    MDD (major depressive disorder), recurrent episode ([place]) 02/09/2017   Dyspareunia 04/15/2014   [place] grade D esophagitis 10/28/2013   Generalized anxiety disorder 03/02/2012   Oppositional defiant disorder 03/02/2012   Bulimia nervosa 02/26/2012   Severe episode of recurrent major depressive disorder, without psychotic features ([place]) 02/26/2012  Allergies:  Allergies  Allergen Reactions   Sodium Ferric Gluconate [Ferrous Gluconate] Shortness Of Breath, Anxiety, Rash and Cough    Reaction occurred in 2014 during Furlecit infusion. Patient received 125 mg Solu-medrol and symptoms resolved.    Medications:   Current Outpatient Medications:    ARIPiprazole (ABILIFY) 10 MG tablet, Take 10 mg by mouth daily., Disp: , Rfl:    bacitracin 500 UNIT/GM ointment, Apply 1 Application topically 2 (two) times daily., Disp: 15 g, Rfl: 0   buPROPion (WELLBUTRIN XL) 300 MG 24 hr tablet, Take 300 mg by mouth every morning., Disp: , Rfl:    Dexmethylphenidate HCl 40 MG CP24, Take 1 capsule by mouth every morning., Disp: , Rfl:    escitalopram (LEXAPRO) 20 MG tablet, Take 20 mg by mouth daily., Disp: , Rfl:    gabapentin (NEURONTIN) 300 MG capsule, Take 1 capsule (300 mg total) by mouth 3 (three) times daily., Disp: 90 capsule, Rfl: 2   hydrOXYzine (VISTARIL) 100 MG capsule, Take 100 mg by mouth at bedtime as needed for anxiety., Disp: , Rfl:    mirtazapine (REMERON) 15 MG tablet, Take 15 mg by mouth at bedtime as needed (sleep)., Disp: , Rfl:    OLANZapine (ZYPREXA) 5 MG tablet, Take 5 mg by mouth at bedtime., Disp: , Rfl:    omeprazole (PRILOSEC) 20 MG capsule, Take 1 capsule (20 mg total) by mouth 2 (two) times daily before a meal., Disp: 60 capsule, Rfl: 11   phenazopyridine (PYRIDIUM) 100 MG tablet, Take 1 tablet (100 mg total) by mouth 3 (three) times daily as needed for pain., Disp: 6 tablet, Rfl: 0  Observations/Objective: Patient is well-developed, well-nourished in no acute distress.  Resting comfortably at home.  Head is normocephalic, atraumatic.  No labored breathing. Speech is clear and coherent with logical content.  Patient is alert and oriented at baseline.    Assessment and Plan: 1. Cellulitis and abscess of left leg  Finished antibiotics. No residual pain, tenderness, warmth. Some residual skin induration which is to be expected. Area overlying shows some mild skin breakdown due to infection and pressure from the initial area of abscess. In absence of symptoms, do not see any need for further antibiotics. Discussed skin changes and time it takes for things to fully heal. Skin care  recommendations given. Strict follow-up discussed.   Follow Up Instructions: I discussed the assessment and treatment plan with the patient. The patient was provided an opportunity to ask questions and all were answered. The patient agreed with the plan and demonstrated an understanding of the instructions.  A copy of instructions were sent to the patient via MyChart unless otherwise noted below.   The patient was advised to call back or seek an in-person evaluation if the symptoms worsen or if the condition fails to improve as anticipated.  Time:  I spent 10 minutes with the patient via telehealth technology discussing the above problems/concerns.    Danielle Rio, PA-C

## 2023-01-05 NOTE — Progress Notes (Signed)
Because of the residual symptoms you may need to have the area drained by a medical professional, I feel your condition warrants further evaluation and I recommend that you be seen in a face to face visit.   NOTE: There will be NO CHARGE for this eVisit   If you are having a true medical emergency please call 911.      For an urgent face to face visit, [facility] has eight urgent care centers for your convenience:   NEW!! [facility] [place] Get Driving Directions [phone] [address] [place], [place] [facility] [place] [facility] [place] Get Driving Directions [phone] [place], [place] [address]   [facility]) Get Driving Directions [phone] [address], [place] [address]  [facility] [place] ([place]) Get Driving Directions [phone] [address] [place],  [place]  [address]  [facility] at [address] Get Driving Directions  [phone] [address] [place],  [place] [address]   [facility] at [facility] Get Driving Directions [phone] [address] [place], [place], [place] [address]   [facility] at [facility] Get Driving Directions  [phone] [address] [place], [place] [address]   [facility] at [place] Get Driving Directions [phone] [address] [place], [place] [address]  Your MyChart E-visit questionnaire answers were reviewed by a board certified advanced clinical practitioner to complete your personal care plan based on your specific symptoms.  Thank you for using e-Visits.

## 2023-01-05 NOTE — Patient Instructions (Addendum)
Welma Monia Pouch, thank you for joining [name], PA-C for today's virtual visit.  While this provider is not your primary care provider (PCP), if your PCP is located in our provider database this encounter information will be shared with them immediately following your visit.   [facility] account gives you access to today's visit and all your visits, tests, and labs performed at [facility] " click here if you don't have a [facility] account or go to [email or web address]  Consent: (Patient) Danielle Prince provided verbal consent for this virtual visit at the beginning of the encounter.  Current Medications:  Current Outpatient Medications:    ARIPiprazole (ABILIFY) 10 MG tablet, Take 10 mg by mouth daily., Disp: , Rfl:    bacitracin 500 UNIT/GM ointment, Apply 1 Application topically 2 (two) times daily., Disp: 15 g, Rfl: 0   buPROPion (WELLBUTRIN XL) 300 MG 24 hr tablet, Take 300 mg by mouth every morning., Disp: , Rfl:    Dexmethylphenidate HCl 40 MG CP24, Take 1 capsule by mouth every morning., Disp: , Rfl:    escitalopram (LEXAPRO) 20 MG tablet, Take 20 mg by mouth daily., Disp: , Rfl:    gabapentin (NEURONTIN) 300 MG capsule, Take 1 capsule (300 mg total) by mouth 3 (three) times daily., Disp: 90 capsule, Rfl: 2   hydrOXYzine (VISTARIL) 100 MG capsule, Take 100 mg by mouth at bedtime as needed for anxiety., Disp: , Rfl:    mirtazapine (REMERON) 15 MG tablet, Take 15 mg by mouth at bedtime as needed (sleep)., Disp: , Rfl:    OLANZapine (ZYPREXA) 5 MG tablet, Take 5 mg by mouth at bedtime., Disp: , Rfl:    omeprazole (PRILOSEC) 20 MG capsule, Take 1 capsule (20 mg total) by mouth 2 (two) times daily before a meal., Disp: 60 capsule, Rfl: 11   phenazopyridine (PYRIDIUM) 100 MG tablet, Take 1 tablet (100 mg total) by mouth 3 (three) times daily as needed for pain., Disp: 6 tablet, Rfl: 0   Medications ordered in this encounter:  No orders  of the defined types were placed in this encounter.    *If you need refills on other medications prior to your next appointment, please contact your pharmacy*  Follow-Up: Call back or seek an in-person evaluation if the symptoms worsen or if the condition fails to improve as anticipated.  [facility] [phone]  Other Instructions Please keep the skin clean and dry. You can apply thin layer of Vaseline or your Triple antibiotic ointment over the area to keep the skin from drying out too much but you do not want to put a lot on -- we do not want the area staying wet as it will delay healing. From this point forward, the area should continue to shrink in size until fully healed. The underlying skin may remain harder than the surrounding skin for a couple of weeks but this should return to normal too.  If you note any recurrence of redness, tenderness/pain, swelling or new drainage, let us know ASAP or seek an in-person evaluation.    If you have been instructed to have an in-person evaluation today at a local Urgent Care facility, please use the link below. It will take you to a list of all of our available [facility] Urgent Cares, including address, phone number and hours of operation. Please do not delay care.  [facility]  If you or a family member do not have a primary  care provider, use the link below to schedule a visit and establish care. When you choose a [facility] primary care physician or advanced practice provider, you gain a long-term partner in health. Find a Primary Care Provider  Learn more about [facility]'s in-office and virtual care options: [facility]

## 2024-05-26 ENCOUNTER — Emergency Department (HOSPITAL_COMMUNITY)
Admission: EM | Admit: 2024-05-26 | Discharge: 2024-05-26 | Discharge status: Against Medical Advice | Attending: Emergency Medicine | Admitting: Emergency Medicine

## 2024-05-26 ENCOUNTER — Emergency Department (HOSPITAL_COMMUNITY)

## 2024-05-26 DIAGNOSIS — R0602 Shortness of breath: Secondary | ICD-10-CM | POA: Diagnosis present

## 2024-05-26 DIAGNOSIS — R519 Headache, unspecified: Secondary | ICD-10-CM | POA: Insufficient documentation

## 2024-05-26 DIAGNOSIS — D649 Anemia, unspecified: Secondary | ICD-10-CM | POA: Diagnosis not present

## 2024-05-26 DIAGNOSIS — D509 Iron deficiency anemia, unspecified: Secondary | ICD-10-CM

## 2024-05-26 LAB — URINALYSIS, ROUTINE W REFLEX MICROSCOPIC
Bilirubin Urine: NEGATIVE
Glucose, UA: NEGATIVE mg/dL
Hgb urine dipstick: NEGATIVE
Ketones, ur: NEGATIVE mg/dL
Nitrite: NEGATIVE
Protein, ur: NEGATIVE mg/dL
Specific Gravity, Urine: 1.004 — ABNORMAL LOW (ref 1.005–1.030)
pH: 7 (ref 5.0–8.0)

## 2024-05-26 LAB — COMPREHENSIVE METABOLIC PANEL WITH GFR
ALT: 17 U/L (ref 0–44)
AST: 20 U/L (ref 15–41)
Albumin: 3 g/dL — ABNORMAL LOW (ref 3.5–5.0)
Alkaline Phosphatase: 59 U/L (ref 38–126)
Anion gap: 8 (ref 5–15)
BUN: 11 mg/dL (ref 6–20)
CO2: 25 mmol/L (ref 22–32)
Calcium: 8.5 mg/dL — ABNORMAL LOW (ref 8.9–10.3)
Chloride: 103 mmol/L (ref 98–111)
Creatinine, Ser: 0.74 mg/dL (ref 0.44–1.00)
GFR, Estimated: >60 mL/min (ref >60)
Glucose, Bld: 113 mg/dL — ABNORMAL HIGH (ref 70–99)
Potassium: 2.9 mmol/L — ABNORMAL LOW (ref 3.5–5.1)
Sodium: 136 mmol/L (ref 135–145)
Total Bilirubin: 0.3 mg/dL (ref 0.0–1.2)
Total Protein: 6.2 g/dL — ABNORMAL LOW (ref 6.5–8.1)

## 2024-05-26 LAB — CBC WITH DIFFERENTIAL/PLATELET
Abs Immature Granulocytes: 0 10*3/uL (ref 0.00–0.07)
Basophils Absolute: 0 10*3/uL (ref 0.0–0.1)
Basophils Relative: 1 %
Eosinophils Absolute: 0.3 10*3/uL (ref 0.0–0.5)
Eosinophils Relative: 6 %
HCT: 29.5 % — ABNORMAL LOW (ref 36.0–46.0)
Hemoglobin: 8.8 g/dL — ABNORMAL LOW (ref 12.0–15.0)
Immature Granulocytes: 0 %
Lymphocytes Relative: 39 %
Lymphs Abs: 1.7 10*3/uL (ref 0.7–4.0)
MCH: 21.7 pg — ABNORMAL LOW (ref 26.0–34.0)
MCHC: 29.8 g/dL — ABNORMAL LOW (ref 30.0–36.0)
MCV: 72.8 fL — ABNORMAL LOW (ref 80.0–100.0)
Monocytes Absolute: 0.5 10*3/uL (ref 0.1–1.0)
Monocytes Relative: 12 %
Neutro Abs: 1.8 10*3/uL (ref 1.7–7.7)
Neutrophils Relative %: 42 %
Platelets: 331 10*3/uL (ref 150–400)
RBC: 4.05 MIL/uL (ref 3.87–5.11)
RDW: 18.6 % — ABNORMAL HIGH (ref 11.5–15.5)
WBC: 4.3 10*3/uL (ref 4.0–10.5)
nRBC: 0 % (ref 0.0–0.2)

## 2024-05-26 LAB — PREGNANCY, URINE: Preg Test, Ur: NEGATIVE

## 2024-05-26 LAB — MAGNESIUM: Magnesium: 1.9 mg/dL (ref 1.7–2.4)

## 2024-05-26 MED ORDER — METOCLOPRAMIDE HCL 5 MG/ML IJ SOLN
10.0000 mg | Freq: Once | INTRAMUSCULAR | Status: AC
  Administered 2024-05-26: 10 mg via INTRAVENOUS
  Filled 2024-05-26: qty 2

## 2024-05-26 MED ORDER — POTASSIUM CHLORIDE 20 MEQ PO PACK: 40.0000 meq | PACK | Freq: Once | ORAL | Status: DC

## 2024-05-26 MED ORDER — DIPHENHYDRAMINE HCL 50 MG/ML IJ SOLN
25.0000 mg | Freq: Once | INTRAMUSCULAR | Status: AC
  Administered 2024-05-26: 25 mg via INTRAVENOUS
  Filled 2024-05-26: qty 1

## 2024-05-26 MED ORDER — SODIUM CHLORIDE 0.9 % IV BOLUS
1000.0000 mL | Freq: Once | INTRAVENOUS | Status: AC
  Administered 2024-05-26: 1000 mL via INTRAVENOUS

## 2024-05-26 NOTE — ED Triage Notes (Addendum)
 Pt states she is anemic and thinks her "blood is low again". Pt endorses shortness of breath, head pressure, and pain in her throat.

## 2024-05-26 NOTE — ED Provider Notes (Signed)
 [place] EMERGENCY DEPARTMENT AT [facility] Provider Note   CSN: [id number] Arrival date & time: 05/26/24  1859     History  Chief Complaint  Patient presents with   Shortness of Breath    Danielle Prince is a 28 y.o. female, hx of anorexia, anemia, who presents to the ED 2/2 to squeezing headache x1 day. Denies N/V, photophobia, head trauma. Also states for the last few months she has been short of breath and thinks that she has low hemoglobin again. Denies chest pain. No heavy vaginal bleeding, hemoptysis, or hematemesis. Denies bloody or black stools. States she just feels tired like she did when she had low hemoglobin for ulcer. Denies abdominal or chest pain.   Home Medications Prior to Admission medications   Medication Sig Start Date End Date Taking? Authorizing Provider  ARIPiprazole  (ABILIFY ) 10 MG tablet Take 10 mg by mouth daily. 09/02/22   [provider]  bacitracin  500 UNIT/GM ointment Apply 1 Application topically 2 (two) times daily. 12/26/22   Ward, Char Common, PA-C  buPROPion  (WELLBUTRIN  XL) 300 MG 24 hr tablet Take 300 mg by mouth every morning. 09/02/22   [provider]  Dexmethylphenidate HCl 40 MG CP24 Take 1 capsule by mouth every morning. 07/26/22   [provider]  escitalopram  (LEXAPRO ) 20 MG tablet Take 20 mg by mouth daily. 01/26/22   [provider]  gabapentin  (NEURONTIN ) 300 MG capsule Take 1 capsule (300 mg total) by mouth 3 (three) times daily. 11/22/19   Suzi Essex, MD  hydrOXYzine  (VISTARIL ) 100 MG capsule Take 100 mg by mouth at bedtime as needed for anxiety.    [provider]  mirtazapine  (REMERON ) 15 MG tablet Take 15 mg by mouth at bedtime as needed (sleep). 01/26/22   [provider]  OLANZapine  (ZYPREXA ) 5 MG tablet Take 5 mg by mouth at bedtime. 09/02/22   [provider]  omeprazole  (PRILOSEC) 20 MG capsule Take 1 capsule (20 mg total) by mouth 2 (two) times daily before a  meal. 02/12/22 02/07/23  Malen Scudder, DO  phenazopyridine  (PYRIDIUM ) 100 MG tablet Take 1 tablet (100 mg total) by mouth 3 (three) times daily as needed for pain. 11/13/22   Crain, Laurina Popper L, PA      Allergies    Sodium ferric gluconate [ferrous gluconate ]    Review of Systems   Review of Systems  Respiratory:  Positive for shortness of breath.   Cardiovascular:  Negative for chest pain.    Physical Exam Updated Vital Signs BP (!) 148/79   Pulse 70   Temp 98 F (36.7 C)   Resp 18   SpO2 99%  Physical Exam Vitals and nursing note reviewed.  Constitutional:      General: She is not in acute distress.    Appearance: She is well-developed.  HENT:     Head: Normocephalic and atraumatic.  Eyes:     Conjunctiva/sclera: Conjunctivae normal.  Cardiovascular:     Rate and Rhythm: Normal rate and regular rhythm.     Heart sounds: No murmur heard. Pulmonary:     Effort: Pulmonary effort is normal. No respiratory distress.     Breath sounds: Normal breath sounds.  Abdominal:     Palpations: Abdomen is soft.     Tenderness: There is no abdominal tenderness.  Musculoskeletal:        General: No swelling.     Cervical back: Neck supple.  Skin:    General: Skin is warm and dry.  Capillary Refill: Capillary refill takes less than 2 seconds.  Neurological:     Mental Status: She is alert.  Psychiatric:        Mood and Affect: Mood normal.     ED Results / Procedures / Treatments   Labs (all labs ordered are listed, but only abnormal results are displayed) Labs Reviewed  CBC WITH DIFFERENTIAL/PLATELET - Abnormal; Notable for the following components:      Result Value   Hemoglobin 8.8 (*)    HCT 29.5 (*)    MCV 72.8 (*)    MCH 21.7 (*)    MCHC 29.8 (*)    RDW 18.6 (*)    All other components within normal limits  COMPREHENSIVE METABOLIC PANEL WITH GFR - Abnormal; Notable for the following components:   Potassium 2.9 (*)    Glucose, Bld 113 (*)    Calcium  8.5 (*)     Total Protein 6.2 (*)    Albumin 3.0 (*)    All other components within normal limits  URINALYSIS, ROUTINE W REFLEX MICROSCOPIC - Abnormal; Notable for the following components:   Color, Urine STRAW (*)    Specific Gravity, Urine 1.004 (*)    Leukocytes,Ua TRACE (*)    Bacteria, UA RARE (*)    All other components within normal limits  PREGNANCY, URINE  MAGNESIUM     EKG None  Radiology DG Chest 2 View Result Date: 05/26/2024 CLINICAL DATA:  Shortness of breath EXAM: CHEST - 2 VIEW COMPARISON:  09/21/2022 and 03/18/2023 FINDINGS: The heart size and mediastinal contours are within normal limits. Both lungs are clear. The visualized skeletal structures are unremarkable. IMPRESSION: No active cardiopulmonary disease. Electronically Signed   By: Rozell Cornet M.D.   On: 05/26/2024 20:08    Procedures Procedures    Medications Ordered in ED Medications  potassium chloride  (KLOR-CON ) packet 40 mEq (has no administration in time range)  metoCLOPramide (REGLAN) injection 10 mg (10 mg Intravenous Given 05/26/24 2015)  diphenhydrAMINE  (BENADRYL ) injection 25 mg (25 mg Intravenous Given 05/26/24 2015)  sodium chloride  0.9 % bolus 1,000 mL (0 mLs Intravenous Stopped 05/26/24 2119)    ED Course/ Medical Decision Making/ A&P                                 Medical Decision Making Patient here for SOB and concern for low hemoglobin. Denies chest pain or recent illnesses--will obtain CXR, blood work for further eval. Declined rectal. Reports fatigue--thus UA ordered. Headache cocktail for likely tension headache.   Amount and/or Complexity of Data Reviewed Labs: ordered.    Details: Hgb of 8.8, microcytic Radiology: ordered.    Details: CXR clear Discussion of management or test interpretation with external provider(s): Discussed with patient, CXR clear, blood work is reassuring. Patient declined rectal. States headache is better. Requested urine given fatigue--left room and was informed  patient had eloped 30 minutes later.   Risk Prescription drug management.    Final Clinical Impression(s) / ED Diagnoses Final diagnoses:  Microcytic anemia  SOB (shortness of breath)  Acute nonintractable headache, unspecified headache type    Rx / DC Orders ED Discharge Orders     None         Deserae Jennings Rolena Click, PA 05/26/24 2352    Flonnie Humphrey, DO 05/28/24 1610

## 2024-05-26 NOTE — ED Notes (Signed)
 Pt ripped her IV out and left. EDP notified.

## 2024-09-10 ENCOUNTER — Other Ambulatory Visit: Payer: Self-pay

## 2024-09-10 ENCOUNTER — Emergency Department (HOSPITAL_COMMUNITY)
Admission: EM | Admit: 2024-09-10 | Discharge: 2024-09-10 | Discharge status: Against Medical Advice | Payer: Self-pay | Attending: Emergency Medicine | Admitting: Emergency Medicine

## 2024-09-10 DIAGNOSIS — Z5321 Procedure and treatment not carried out due to patient leaving prior to being seen by health care provider: Secondary | ICD-10-CM | POA: Insufficient documentation

## 2024-09-10 DIAGNOSIS — K920 Hematemesis: Secondary | ICD-10-CM | POA: Insufficient documentation

## 2024-09-10 NOTE — ED Notes (Signed)
 Tried twice for blood.  One clot and the other  not enough blood flow.

## 2024-09-10 NOTE — ED Notes (Addendum)
 2 unsuccessful attempts at lab draw by 2 phlebotomists. IV Team RN states they wont come see pt because only labs are ordered at this time, and pt is still in the lobby.

## 2024-09-10 NOTE — Progress Notes (Signed)
 [facility]AST consult. Patient is still currently in the lobby. VAST does not place PIV in the lobby. At this time patient has no infusions/tests /order for [facility] that require PIV placement at this time. Notified RN to place consult once patient is in an ED room and PIV needed at that time. Powell Bowler, RN VAST

## 2024-09-10 NOTE — ED Triage Notes (Signed)
 Pt BIB EMS from home for hematemesis. She's thrown up multiple times but today she's vomiting bright red blood after eating. 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  128/84 82 hr 97% RA

## 2024-09-16 ENCOUNTER — Emergency Department (HOSPITAL_COMMUNITY): Payer: Self-pay

## 2024-09-16 ENCOUNTER — Other Ambulatory Visit: Payer: Self-pay

## 2024-09-16 ENCOUNTER — Emergency Department (HOSPITAL_COMMUNITY)
Admission: EM | Admit: 2024-09-16 | Discharge: 2024-09-16 | Disposition: A | Discharge status: Home / Self Care | Payer: Self-pay | Attending: Emergency Medicine | Admitting: Emergency Medicine

## 2024-09-16 ENCOUNTER — Encounter (HOSPITAL_COMMUNITY): Payer: Self-pay

## 2024-09-16 DIAGNOSIS — R09A2 Foreign body sensation, throat: Secondary | ICD-10-CM | POA: Insufficient documentation

## 2024-09-16 NOTE — ED Provider Notes (Signed)
 [place] EMERGENCY DEPARTMENT AT [facility] Provider Note   CSN: [id number] Arrival date & time: 09/16/24  0044     Patient presents with: Swallowed Foreign Body   Danielle Prince is a 28 y.o. female.   The history is provided by the patient and medical records.  Swallowed Foreign Body   67 old female presenting to the ED with concern of foreign body sensation of the throat.  States she was smoking fentanyl  and a pipe and she felt like part of it broke and she may have inhaled it.  States she feels some scratchiness in her throat.  She denies any cough, hemoptysis, or vomiting.  Denies SOB or chest pain.    Prior to Admission medications   Medication Sig Start Date End Date Taking? Authorizing Provider  ARIPiprazole  (ABILIFY ) 10 MG tablet Take 10 mg by mouth daily. 09/02/22   [provider]  bacitracin  500 UNIT/GM ointment Apply 1 Application topically 2 (two) times daily. 12/26/22   Ward, Harlene PEDLAR, PA-C  buPROPion  (WELLBUTRIN  XL) 300 MG 24 hr tablet Take 300 mg by mouth every morning. 09/02/22   [provider]  Dexmethylphenidate HCl 40 MG CP24 Take 1 capsule by mouth every morning. 07/26/22   [provider]  escitalopram  (LEXAPRO ) 20 MG tablet Take 20 mg by mouth daily. 01/26/22   [provider]  gabapentin  (NEURONTIN ) 300 MG capsule Take 1 capsule (300 mg total) by mouth 3 (three) times daily. 11/22/19   Malinda Rogue, MD  hydrOXYzine  (VISTARIL ) 100 MG capsule Take 100 mg by mouth at bedtime as needed for anxiety.    [provider]  mirtazapine  (REMERON ) 15 MG tablet Take 15 mg by mouth at bedtime as needed (sleep). 01/26/22   [provider]  OLANZapine  (ZYPREXA ) 5 MG tablet Take 5 mg by mouth at bedtime. 09/02/22   [provider]  omeprazole  (PRILOSEC) 20 MG capsule Take 1 capsule (20 mg total) by mouth 2 (two) times daily before a meal. 02/12/22 02/07/23  Addie Perkins, DO  phenazopyridine  (PYRIDIUM ) 100 MG  tablet Take 1 tablet (100 mg total) by mouth 3 (three) times daily as needed for pain. 11/13/22   Crain, Benton L, PA    Allergies: Sodium ferric gluconate [ferrous gluconate ]    Review of Systems  HENT:         FB sensation in throat  All other systems reviewed and are negative.   Updated Vital Signs BP 123/80 (BP Location: Left Arm)   Pulse 65   Temp 98.3 F (36.8 C) (Oral)   Resp 17   Ht 5' 9.5 (1.765 m)   Wt 78 kg   LMP  (LMP Unknown)   SpO2 96%   BMI 25.03 kg/m   Physical Exam Vitals and nursing note reviewed.  Constitutional:      Appearance: She is well-developed.     Comments: Sleeping-- was awoken for exam, appears under the influence  HENT:     Head: Normocephalic and atraumatic.     Mouth/Throat:     Comments: Handling secretions well, normal phonation without stridor Eyes:     Conjunctiva/sclera: Conjunctivae normal.     Pupils: Pupils are equal, round, and reactive to light.  Cardiovascular:     Rate and Rhythm: Normal rate and regular rhythm.     Heart sounds: Normal heart sounds.  Pulmonary:     Effort: Pulmonary effort is normal.     Breath sounds: Normal breath sounds. No stridor. No wheezing.  Abdominal:     General: Bowel sounds are normal.     Palpations: Abdomen is soft.  Musculoskeletal:        General: Normal range of motion.     Cervical back: Normal range of motion.  Skin:    General: Skin is warm and dry.  Neurological:     Mental Status: She is oriented to person, place, and time.     (all labs ordered are listed, but only abnormal results are displayed) Labs Reviewed - No data to display  EKG: None  Radiology: DG Chest 2 View Result Date: 09/16/2024 CLINICAL DATA:  Possibly inhaled metal EXAM: CHEST - 2 VIEW COMPARISON:  05/26/2024 FINDINGS: The heart size and mediastinal contours are within normal limits. Both lungs are clear. The visualized skeletal structures are unremarkable. No visible foreign body. IMPRESSION: No acute  cardiopulmonary disease.  No radiopaque foreign body. Electronically Signed   By: Franky Crease M.D.   On: 09/16/2024 02:15     Procedures   Medications Ordered in the ED - No data to display                                  Medical Decision Making Amount and/or Complexity of Data Reviewed Radiology: ordered and independent interpretation performed.   28 year old female presenting to the ED with concern of foreign body sensation of the throat after pipe broke while smoking fentanyl .  She does not have any airway compromise, handling secretions well, normal phonation without stridor.  X-ray without any visible foreign body.  She is hemodynamically stable.  Patient was reassured, stable for discharge.  Final diagnoses:  Foreign body sensation in throat    ED Discharge Orders     None          Jarold Olam HERO, PA-C 09/16/24 0233    Griselda Norris, MD 09/16/24 [phone]

## 2024-09-16 NOTE — Discharge Instructions (Signed)
 No foreign body seen on x-ray today.

## 2024-09-16 NOTE — ED Triage Notes (Signed)
 Pt arrived via [facility] c/o while smoking fentanyl  that a hot piece of metal was sucked down. Pt states that it might have been swallowed, but pt can still feel it. Airway noted to be patent in triage. Pt swallowing well also
